# Patient Record
Sex: Male | Born: 1940
Health system: Southern US, Community
[De-identification: ages and names within clinical notes are randomized; demographics above are authoritative.]

## PROBLEM LIST (undated history)

## (undated) DIAGNOSIS — D649 Anemia, unspecified: Secondary | ICD-10-CM

## (undated) DIAGNOSIS — I639 Cerebral infarction, unspecified: Secondary | ICD-10-CM

## (undated) DIAGNOSIS — F419 Anxiety disorder, unspecified: Secondary | ICD-10-CM

## (undated) DIAGNOSIS — K275 Chronic or unspecified peptic ulcer, site unspecified, with perforation: Secondary | ICD-10-CM

## (undated) DIAGNOSIS — N400 Enlarged prostate without lower urinary tract symptoms: Secondary | ICD-10-CM

## (undated) DIAGNOSIS — G709 Myoneural disorder, unspecified: Secondary | ICD-10-CM

## (undated) DIAGNOSIS — M1990 Unspecified osteoarthritis, unspecified site: Secondary | ICD-10-CM

## (undated) DIAGNOSIS — K219 Gastro-esophageal reflux disease without esophagitis: Secondary | ICD-10-CM

## (undated) DIAGNOSIS — E119 Type 2 diabetes mellitus without complications: Secondary | ICD-10-CM

## (undated) DIAGNOSIS — I10 Essential (primary) hypertension: Secondary | ICD-10-CM

## (undated) HISTORY — DX: Cerebral infarction, unspecified: I63.9

## (undated) HISTORY — PX: TONSILLECTOMY: SUR1361

## (undated) HISTORY — PX: ANKLE SURGERY: SHX546

## (undated) HISTORY — DX: Myoneural disorder, unspecified: G70.9

## (undated) HISTORY — PX: CARPAL TUNNEL RELEASE: SHX101

---

## 1980-05-12 DIAGNOSIS — K275 Chronic or unspecified peptic ulcer, site unspecified, with perforation: Secondary | ICD-10-CM

## 1980-05-12 HISTORY — DX: Chronic or unspecified peptic ulcer, site unspecified, with perforation: K27.5

## 1997-11-27 ENCOUNTER — Ambulatory Visit (HOSPITAL_COMMUNITY): Admission: RE | Admit: 1997-11-27 | Discharge: 1997-11-27 | Payer: Self-pay | Admitting: Gastroenterology

## 2004-05-12 HISTORY — PX: TRANSURETHRAL RESECTION OF PROSTATE: SHX73

## 2004-06-22 ENCOUNTER — Emergency Department (HOSPITAL_COMMUNITY): Admission: EM | Admit: 2004-06-22 | Discharge: 2004-06-23 | Payer: Self-pay | Admitting: *Deleted

## 2004-07-09 ENCOUNTER — Encounter (INDEPENDENT_AMBULATORY_CARE_PROVIDER_SITE_OTHER): Payer: Self-pay | Admitting: *Deleted

## 2004-07-09 ENCOUNTER — Inpatient Hospital Stay (HOSPITAL_COMMUNITY): Admission: RE | Admit: 2004-07-09 | Discharge: 2004-07-11 | Payer: Self-pay | Admitting: Urology

## 2004-08-01 ENCOUNTER — Ambulatory Visit (HOSPITAL_COMMUNITY): Admission: RE | Admit: 2004-08-01 | Discharge: 2004-08-01 | Payer: Self-pay | Admitting: Emergency Medicine

## 2004-12-24 ENCOUNTER — Encounter: Admission: RE | Admit: 2004-12-24 | Discharge: 2004-12-24 | Payer: Self-pay | Admitting: Family Medicine

## 2004-12-26 ENCOUNTER — Inpatient Hospital Stay (HOSPITAL_COMMUNITY): Admission: EM | Admit: 2004-12-26 | Discharge: 2005-01-01 | Payer: Self-pay | Admitting: Emergency Medicine

## 2004-12-26 ENCOUNTER — Ambulatory Visit: Payer: Self-pay | Admitting: Infectious Diseases

## 2004-12-26 ENCOUNTER — Encounter: Admission: RE | Admit: 2004-12-26 | Discharge: 2004-12-26 | Payer: Self-pay | Admitting: Family Medicine

## 2004-12-30 ENCOUNTER — Encounter (INDEPENDENT_AMBULATORY_CARE_PROVIDER_SITE_OTHER): Payer: Self-pay | Admitting: Specialist

## 2005-02-14 ENCOUNTER — Encounter: Admission: RE | Admit: 2005-02-14 | Discharge: 2005-02-14 | Payer: Self-pay | Admitting: Neurology

## 2007-02-02 ENCOUNTER — Encounter: Admission: RE | Admit: 2007-02-02 | Discharge: 2007-02-02 | Payer: Self-pay | Admitting: Emergency Medicine

## 2007-02-17 ENCOUNTER — Emergency Department (HOSPITAL_COMMUNITY): Admission: EM | Admit: 2007-02-17 | Discharge: 2007-02-17 | Payer: Self-pay | Admitting: Emergency Medicine

## 2007-06-18 ENCOUNTER — Encounter (INDEPENDENT_AMBULATORY_CARE_PROVIDER_SITE_OTHER): Payer: Self-pay | Admitting: Interventional Radiology

## 2007-06-18 ENCOUNTER — Ambulatory Visit (HOSPITAL_COMMUNITY): Admission: RE | Admit: 2007-06-18 | Discharge: 2007-06-18 | Payer: Self-pay | Admitting: Otolaryngology

## 2007-08-05 ENCOUNTER — Encounter: Admission: RE | Admit: 2007-08-05 | Discharge: 2007-08-05 | Payer: Self-pay | Admitting: Orthopedic Surgery

## 2007-08-31 ENCOUNTER — Ambulatory Visit (HOSPITAL_BASED_OUTPATIENT_CLINIC_OR_DEPARTMENT_OTHER): Admission: RE | Admit: 2007-08-31 | Discharge: 2007-09-01 | Payer: Self-pay | Admitting: Orthopedic Surgery

## 2008-07-26 ENCOUNTER — Ambulatory Visit (HOSPITAL_BASED_OUTPATIENT_CLINIC_OR_DEPARTMENT_OTHER): Admission: RE | Admit: 2008-07-26 | Discharge: 2008-07-26 | Payer: Self-pay | Admitting: Orthopedic Surgery

## 2009-08-13 ENCOUNTER — Encounter: Admission: RE | Admit: 2009-08-13 | Discharge: 2009-08-13 | Payer: Self-pay | Admitting: General Surgery

## 2010-05-12 HISTORY — PX: HERNIA REPAIR: SHX51

## 2010-08-22 LAB — POCT HEMOGLOBIN-HEMACUE: Hemoglobin: 14.6 g/dL (ref 13.0–17.0)

## 2010-08-26 ENCOUNTER — Other Ambulatory Visit: Payer: Self-pay | Admitting: Dermatology

## 2010-09-24 NOTE — Op Note (Signed)
NAME:  David Li, David Li              ACCOUNT NO.:  [id number]   MEDICAL RECORD NO.:  [id number]          PATIENT TYPE:  AMB   LOCATION:  DSC                          FACILITY:  [facility]   PHYSICIAN:  Leonides Grills, M.D.     DATE OF BIRTH:  1940/07/23   DATE OF PROCEDURE:  08/31/2007  DATE OF DISCHARGE:                               OPERATIVE REPORT   PREOPERATIVE DIAGNOSES:  1. Left cavus foot.  2. Left tight gastroc.  3. Left ankle instability.  4. Left anterior distal tibial spurs.  5. Left hindfoot varus malalignment.  6. Left fibular spurs.   POSTOPERATIVE DIAGNOSES:  1. Left cavus foot.  2. Left tight gastroc.  3. Left ankle instability.  4. Left anterior distal tibial spurs.  5. Left hindfoot varus malalignment.   OPERATION:  1. Left Dwyer calcaneal osteotomy.  2. Left first dorsiflexion metatarsal osteotomy.  3. Left Brostrom procedure.  4. Left excision, anterior distal tibial spurs.  5. Left release, deep deltoid ligament.  6. Left gastroc slide.  7. Stress x-rays, left foot.  8. Excision, left fibular spurs.   ANESTHESIA:  General.   SURGEON:  Leonides Grills, MD.   ASSISTANT:  Richardean Canal, PAC.   ESTIMATED BLOOD LOSS:  Minimal.   TOURNIQUET TIME:  Two hours.   COMPLICATIONS:  None.   DISPOSITION:  Stable to PR.   INDICATIONS:  This is a 70 year old male who has a varus talar tilt with  isolated medial left ankle arthritis.  He was initially consented for a  Weber tib-fib osteotomy.  However, on preoperative planning, we decided  that due to the fact that his plafond was parallel with the  weightbearing surface on weightbearing films, we decided not to proceed  with this procedure and add a dorsiflexion first metatarsal osteotomy.  We went over the risks of the procedure, which included infection, nerve  or vessel injury, nonunion, malunion, hard tissue, hardware  failure,  persistent pain, worsening pain, prolonged recovery, persistent  stiffness,  arthritis, persistent pain and worsening pain in the ankle,  and a possibility of an ankle fusion versus hemiarthroplasty in the  future were all explained.  Questions were encouraged and answered.   OPERATION:  The patient was brought to the operating room and placed in  supine position after adequate general endotracheal tube anesthesia with  block was administered as well as Ancef 1 gm IV piggyback.  Pump was  placed on the left ipsilateral hip internally rotating the left lower  extremity.  Left lower extremity was then prepped and draped in sterile  manner over a proximally placed thigh tourniquet.  Limb was gravity  exsanguinated and tourniquet was elevated to 290 mmHg.  Curvilinear  incision was made on the anterior aspect the left ankle.  Dissection was  carried down through the skin.  Hemostasis was obtained.  Capsule was  then opened on the anterior edge of the lateral gutter of the ankle  leaving about 2-3 mm of capsular cuff attached to the lateral malleolus.  Once this was opened, there was a large amount of synovitis in the ankle  joint itself.  This was removed with a rongeur.  We then elevated over  the anterior aspect of the tibia.  We then found the spur on the  anterior aspect of the fibula and this was removed with a curved  Converse osteotome.  We then elevated off the anterior aspect of the  tibia.  Once this was done, this was then protected with a knee  retractor soft tissues anteriorly and with a curved Converse osteotome,  the anterior osteophyte was completely removed.  We then made an  anteromedial approach to the ankle just medial to the anterior tibialis  tendon.  Dissection was carried down through the skin.  Hemostasis was  obtained.  Capsule was opened in line of the incision and through this  incision, we removed the remaining part of the osteophyte off the  anterior aspect of the ankle.  We also through this incision released  the deep deltoid ligament  with a curved Converse osteotome and a blunt  wooden-handled elevator.  There  was also loose body within the deltoid  ligament as well that was removed and we were able to reduce the ankle  joint to normal once this was released.  After this was done, the joint  in the area was copiously irrigated with normal saline.  We then made a  trough in the anterior aspect of the lateral malleolus with a rongeur  and a curved Converse osteotome.  We then placed two mini [employer]  suture anchors that are bioabsorbable with 2-0 FiberWire stitch.  We  then advanced the anterolateral capsule and ligaments with the ankle  held in dorsiflexion and eversion, reconstructed and performed the  Brostrom procedure using a 2-0 FiberWire stitch.  Once this was advanced  into this trough down to the suture anchors, we then repaired the  remaining part of the ligaments to the cuff with 2-0 FiberWire stitch.  This had an Conservation officer, historic buildings.  We were able to range the joint and it  had excellent tension, anterior drawer and eversion.  There was  excellent endpoint as well.  We then made a longitudinal incision just  medial to the EHL tendon.  Dissection was carried down through skin.  Hemostasis was obtained.  Under C-arm guidance, we identified the first  TMT joint and its plane.  We then approximately a centimeter and a half  distal to the first TMT joint performed an osteotomy, closing wedge  type, removing a wedge of bone dorsally.  This measured approximately 2-  3 mm at its base.  Once this was removed, we then closed the osteotomy  and placed a 4-hole mini T plate [employer].  We then placed four 2.0-mm  fully threaded cortical set screws using a 1.1-mm drill hole  respectively.  This had excellent purchase and maintenance of the  correction.  We obtained stress x-rays in AP and lateral planes, which  show no gross motion, fixation, proposition, or excellent alignment as  well.  We then made a longitudinal  incision over the lateral aspect of  the calcaneal tuber.  Dissection was carried down through skin.  Hemostasis was obtained.  We then elevated the soft tissues both  superiorly and inferiorly around the calcaneal tuber as well as off the  lateral wall of the calcaneus for an anticipation of removal of the  wedge.  We then osteotomized the calcaneus protecting the soft tissue  superiorly and inferiorly with Bennett retractors.  Once the osteotomy  was completed, we then  placed an osteotome in the osteotomy site and  stretched the soft tissues.  We then used a lamina spreader to stress  the soft tissue as well.  We then returned back to the saw and removed a  wedge of bone base laterally and this was approximately 3-4 mm at its  base.  Once this was removed, we then closed the osteotomy down  transiently slightly laterally and slightly dorsally to decrease the  overall arch height.  Once this was done, it had excellent apposition.  We then placed a 2-0 K-wire.  Once this was held in correct position, we  then made a longitudinal incision in midline and the posterior aspect of  the heel and then placed two partially-threaded 16-mm cancellous screws  using 4.5 and 3.2-mm drill holes respectively.  This had excellent  purchase and maintenance of the correction.  K-wire was removed.  Stress  x-rays were obtained in AP and lateral planes showed no gross motion,  fixation, proposition, and excellent alignment as well.  Tourniquet was  deflated at this point.  Hemostasis was obtained.  We then made a  longitudinal incision in the medial aspect of the gastrocnemius muscle-  tendinous junction.  Dissection was carried down through the skin.  Hemostasis was obtained.  Conjoined fascia was opened along the  incision.  Conjoined region was then developed between the gastroc  soleus.  Soft tissue was then elevated off the posterior aspect of the  gastrocnemius.  Sural nerves identified and plexus  posteriorly.  Gastrocnemius was then released with a curved Mayo scissors.  This had  extremely tight gastroc.  The area was copiously irrigated with normal  saline.  Subcu was closed with 3-0 Vicryl.  Skin was closed with 4-0  nylon.  Over all wounds, sterile dressing was applied.  Final stress x-  rays were obtained prior to dressing application.  AP and lateral views  of the calcaneus showed no gross motion, fixation, proposition, and  excellent alignment as well.  We did axial view of the calcaneus, showed  the fixation was in proper position as well.  Sterile dressing was  applied.  Modified Jones dressing was applied.  The ankle in neutral  dorsiflexion.  The patient was stable to the PR.      Leonides Grills, M.D.  Electronically Signed     PB/MEDQ  D:  08/31/2007  T:  09/01/2007  Job:  [id number]

## 2010-09-24 NOTE — Op Note (Signed)
NAME:  David Li, David Li NO.:  [id number]   MEDICAL RECORD NO.:  [id number]          PATIENT TYPE:  AMB   LOCATION:  DSC                          FACILITY:  [facility]   PHYSICIAN:  Leonides Grills, M.D.     DATE OF BIRTH:  1940/07/30   DATE OF PROCEDURE:  07/26/2008  DATE OF DISCHARGE:                               OPERATIVE REPORT   PREOPERATIVE DIAGNOSIS:  Right medial and lateral meniscal tears.   POSTOPERATIVE DIAGNOSIS:  Right medial and lateral meniscal tears.   OPERATION:  Right knee arthroscopy with debridement of medial and  lateral meniscal tears.   ANESTHESIA:  General.   SURGEON:  Leonides Grills, MD   ASSISTANT:  Richardean Canal, PA-C   ESTIMATED BLOOD LOSS:  Minimal.   TOURNIQUET:  None.   COMPLICATIONS:  None.   DISPOSITION:  Stable to PR.   INDICATIONS:  This is a 70 year old male who has had mechanical symptoms  in his right knee with pain and swelling that was interfering with his  life.  He was consented for the above procedure.  All risks of  infection, neuro vessel injury, persistent pain, worsening pain,  prolonged recovery, stiffness, arthritis, sinus formation, synovial cyst  formation, and possibility of future knee replacement were all  explained.  Questions were encouraged and answered.   OPERATION:  The patient was brought to the operating room and placed in  supine position after adequate general endotracheal anesthesia was  administered as well as Ancef gram IV piggyback.  We then placed a side  bolster on the right side with carefully padded and all bony prominence  were well padded.  Right lower extremity was then prepped and draped in  sterile manner.  No tourniquet was used.  Anatomical landmarks were  mapped out.  A 20 mL normal saline was instilled into the right knee.  We used a nick and spread technique to create the superomedial inflow  portal into the suprapatellar pouch.  Once inflow was established with  blunt tip trocar  with cannula, then we created our anteromedial portal  with the spinal needle followed by nick and spread technique.  Blunt tip  trocar with cannula followed by camera was placed into the knee and  under direct visualization, the anterolateral portal was then created.  There was a large amount of synovitis in this area in the femoral notch,  anterior knee area.  This was carefully debrided with a shaver and a  radiofrequency 90-degree bevel.  Once this was done, we were able to  visualize the lateral compartment of the knee.  There was bone-on-bone  arthritis in this area and extensive meniscal tear.  This was then  carefully debrided with upbiting forceps as well as shaver.  Once this  was completed there was no flap and this was probed __________ into the  joint.  We then placed the camera anteromedially.  Again, there was just  a small tear on the posterior aspect of the lateral meniscus.  This was  carefully debrided.  There was arthritic changes somewhat to a lesser  extent of the medial  compartment in this area as well.  Patellofemoral  joint had minimal arthritis.  Pictures were obtained throughout the  procedure.  Camera and inflow were removed.  After fluid was removed  from the knee, there was no pulsatile bleeding.  There was no bleeding  in the knee at the end of the procedure.  We closed the wounds with 4-0  nylon stitch.  Sterile dressing was applied.  The patient was stable to  PR.       Leonides Grills, M.D.  Electronically Signed     PB/MEDQ  D:  07/26/2008  T:  07/27/2008  Job:  [id number]

## 2010-09-24 NOTE — Consult Note (Signed)
NAME:  David Li, David Li NO.:  [id number]   MEDICAL RECORD NO.:  [id number]          PATIENT TYPE:  EMS   LOCATION:  ED                           FACILITY:  [facility]   PHYSICIAN:  Genene Churn. Love, M.D.    DATE OF BIRTH:  1940/11/19   DATE OF CONSULTATION:  02/17/2007  DATE OF DISCHARGE:                                 CONSULTATION   PRIMARY CARE PHYSICIAN:  Oley Balm. Georgina Pillion, M.D.   RHEUMATOLOGIST:  Lemmie Evens, M.D.   This 70 year old right-handed white married male from [place], [place] is seen in the [facility] Emergency Room for  evaluation of vertigo and to consider the possibility of stroke.   HISTORY OF PRESENT ILLNESS:  [name] has a past history of unusual  illness in August of 2006 at which time he presented with discomfort in  his legs, elevated liver function tests myalgias and had negative workup  for Lyme disease, [facility] spotted fever, syphilis, and hepatitis.  He had elevated sed rate of 113, was noted to be anemic, and underwent a  temporal artery biopsy which showed nonspecific inflammation, and no  granulomatous changes.  He was started on prednisone, and had marked  improvement in his symptomatology, and has been followed by Dr.  Jimmy Footman, rheumatologist with the diagnosis of suspected vasculitis.  He has been on 2.5mg  of methotrexate once per week on Wednesday, and a  tapering course of methylprednisolone 4 mg daily.  He has had some  generalized weakness, and some symptoms of what he described as  hypoglycemia.   The morning of his visit to emergency room about 6:00 a.m. he noted the  onset of spinning sensation.  He turned over in bed and it got worse.  He had dry heaves, but no vomiting.  He was cold and sweaty, and noted  to be somewhat diaphoretic.  He had vague blurred vision, but no double  vision, and no loss of vision.  He had no associated headache, syncope,  seizure, chest pain, palpitations,  hiccups, double vision, focal  weakness, or numbness.  He was seen emergency room but did not have any  nystagmus.  He felt better and underwent an MRI study of the brain which  showed a questionable area in the medulla without definite evidence of  stroke, and showed no pica on the left.  His MRA, otherwise, was  unremarkable.  He had a few scattered long P2 signal changes which had  been noted on previous MRIs.  He had been on Plavix as an outpatient.  He had no history of cigarette use.  No history of diabetes mellitus, or  known history of coronary artery disease.  He does have a history of  hypertension.   PAST MEDICAL HISTORY:  1. Significant for a vasculitis diagnosed in 2006.  2. Peripheral neuropathy followed by Dr. Thad Ranger in 2004.  3. Carpal tunnel syndrome diagnosed by Dr. Thad Ranger.  4. Hypertension.  5. BPH status post TURP.  6. Squamous cell carcinoma of the back and basal cell carcinoma the      face.  7. He is status post GI surgery for perforated ulcer in 1982.   CURRENT MEDICATION:  1. Methylprednisolone 4 mg daily.  2. Prilosec 20 mg daily.  3. Folic acid 1 mg daily.  4. Methotrexate 15 mg once week.  5. Plavix 75 mg daily.  6. Calcium 600 mg with vitamin D daily.   SOCIAL HISTORY:  He finished college and other courses.  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  He is married and has two children, a daughter 50 and a  son 31 living and well.   FAMILY HISTORY:  His mother died at age 22 with congestive heart  failure.  His father died at 10 with diabetes mellitus, and had a stroke  and myocardial infarction.  He has a brother who died at 73 from lung  cancer and has had cirrhosis he has a sister 31 who has had diabetes  mellitus, and peripheral vascular disease requiring a stent.   PHYSICAL EXAMINATION:  GENERAL:  Revealed a well-developed white male.  VITAL SIGNS:  Blood pressure right-and-left  arm 120/80.  Heart rate was  64, there were no bruits.  MENTAL STATUS:  He is alert and oriented x3, follows 1-2-and-3-step  commands.  His cranial nerve examination revealed visual fields full.  I  did not see any nystagmus.  There was no ocular dysmetria.  Both disks  were seen and flat.  The pupils reacted from 3.5 to 3 bilaterally.  He  had a right ptosis which was apparently congenital and he has known  about it and it is old.  He had no evidence of a Horner syndrome.  His  tongue was midline, the uvula was midline, and  gags were present.  His  sternocleidomastoid and trapezius testing were normal.  His motor  examination revealed good strength in the upper-and-lower extremities.  His coordination testing revealed finger-to-nose and heel-to-shin to be  within normal limits.  His sensory examination was significant decreased  vibration and pinprick in his lower extremities with stocking  distribution, decreased pinprick.  He had absent knee jerks, absent  ankle jerks.  He had a positive Tinel's in his right wrist.  There  was  no cyanosis, clubbing, or edema in extremities.  HEART EXAMINATION:  Revealed no murmurs.  LUNGS:  Clear.  BACK:  He had a scar on his back from his previous squamous cell  carcinoma.   Review of his MRI with Dr. Constance Goltz did not reveal any definite evidence of  stroke.  His vessels were open.  He did not have a well seen left pica,  but this apparently is a fairly common finding.  Other studies have  included a sed rate which was normal.  A cortisol which is 21.7.  White  blood cell count 7400, hemoglobin 13.2, hematocrit 38.2, platelet count  198,000.  ProTime 13.5, INR 1.0, PTT 22.  His myoglobin was 53.0, CK-MB  was 1.3.  Urinalysis was unremarkable.  His sodium 142, potassium 3.5,  chloride 107, CO2 content 27, glucose 125, BUN 11, creatinine 0.72,  calcium 8.6, total protein 5.6, albumin 3.2, SGOT 24, SGPT is 18.   IMPRESSION:  1. Vertigo.  Cannot rule  out the possibility of stroke.  Can also      consider, the possibility of peripheral vestibulopathy.  At this      time, I have discussed with the patient that we do not have full  knowledge of the cause of his event.  He is aware of that.  He is      able to ambulate, and get around, and is currently feeling much      better than on admission.  He will continue Plavix.  He will call      me should he have any further changes.  I will consider admitting      him for further evaluation to include 2-D echo,  if symptoms recur.  2. History of vasculitis.  3. Peripheral neuropathy by examination.  4. Carpal tunnel syndrome.  5. Hypertension.  6. Status post transurethral resection of the prostate for benign      prostatic hypertrophy.  7. Squamous cell carcinoma of the back.  8. Basal cell carcinoma of the face.  9. History of perforated ulcer status post surgery in 1982.           ______________________________  Genene Churn. Sandria Manly, M.D.     JML/MEDQ  D:  02/17/2007  T:  02/18/2007  Job:  [id number]   cc:   Oley Balm. Georgina Pillion, M.D.  Fax: [phone]   Lemmie Evens, M.D.  Fax: [phone]

## 2010-09-27 NOTE — Consult Note (Signed)
David Li, POMPEI NO.:  [id number]   MEDICAL RECORD NO.:  [id number]          PATIENT TYPE:  INP   LOCATION:  6734                         FACILITY:  [facility]   PHYSICIAN:  Bevelyn Buckles. Champey, M.D.DATE OF BIRTH:  May 19, 1940   DATE OF CONSULTATION:  12/28/2004  DATE OF DISCHARGE:                                   CONSULTATION   NEUROLOGY CONSULTATION:   REASON FOR EVALUATION:  Recurrent fevers, myalgias and weakness.   HISTORY OF PRESENT ILLNESS:  David Li is a 70 year old white male who  presented initially with recurrent fevers and myalgias.  His symptoms  started back on December 13, 2004 where he noticed bumps on his head and now  feels one in his right temporal head region.  He does keep a low-grade fever  and started developing muscle aches and pains primarily in his anterior  thighs.  His achiness progressed over time and now patient feels weak in his  bilateral legs stating he can hardly walk.  Pain medications have helped  reduced the pain and allow him to get around and move around.  The patient  also has numbness in his bilateral arms from his wrists to his elbow on both  upper extremities and some lower back pain.  Patient has seen Dr. Thad Ranger  in neurology 4 months ago for some left lower extremity symptoms and EMG  nerve conductions were essentially normal.  Patient also had an MRI of the  neck and back at that time which just showed some arthritic changes.  These  results are as per patient.  The results were not available at this time.   PAST MEDICAL HISTORY:  Positive for BPH status post TURP and ulcers.   CURRENT MEDICATIONS:  Current medications include Cardura, Dilaudid,  oxycodone, Phenergan, and Ambien.   ALLERGIES:  Patient has no known drug allergies.   FAMILY HISTORY:  Positive for peripheral vascular disease, coronary artery  disease and diabetes.   SOCIAL HISTORY:  Patient currently lives with his wife, he quit smoking 22  years  ago, he drinks two glasses of wine per night and denies any drug use.   REVIEW OF SYSTEMS:  Positive for lower extremity aches and weakness,  unsteadiness on his feet and constipation.  Negative for greater than nine  other organ systems.   EXAMINATION:  VITALS:  T-max is 102.2, T-current is 98, pulse is 92,  respirations are 18, blood pressure is 119/68.  Patient has 92% oxygen  saturation on room air.  HEENT:  Normocephalic, atraumatic.  Extraocular muscles are intact.  Pupils  equal, round, reactive to light.  Neck is supple, no carotid bruits.  HEART:  Regular.  LUNGS:  Clear.  ABDOMEN:  Soft, nontender.  EXTREMITIES:  No edema with good pulses.  NEUROLOGICAL EXAMINATION:  Patient is alert and oriented x3, language is  fluent, memory and knowledge are within normal limits.  Cranial nerves II-  XII are grossly intact.  Motor examination:  Patient has 5/5 strength in his  bilateral upper extremities and distal lower extremities and proximal upper  extremities especially in hip  flexion patient is 3-/5 bilaterally.  Patient  has normal tone in all four extremities.  Sensory examination:  Patient has  decreased sensation throughout the entire lower extremities except for  vibration which is unremarkable.  Reflexes are 1 to 2+ throughout and  symmetric, trace at the ankles bilaterally, toes are downgoing bilaterally.  Cerebellar function has within normal limits finger-to-nose.  Gait:  Patient  has a slightly wide-base gait and needs assistance for walking, states he is  in pain.   LABORATORIES:  CBC:  WBC is 15.7, hemoglobin is 12.7, hematocrit 37.9,  platelets 547, ESR is 113.  Sodium is 135, potassium is 4, chloride is 97,  bicarbonate is 28, BUN is 9, creatinine is 1.1, glucose is 101, calcium is  8.2, phosphorus 4.4.  RPR is negative.  Alk phos is 203, AST is 70, ALT is  77.  CK is 13.   IMPRESSION:  David Li is a 70 year old gentleman who presents for  recurrent fevers,  myalgias and lower extremity weakness that is making him  unsteady on his feet.  The etiology for his fever is unclear at this point  and I recommend panculturing for obvious infectious etiologies.  It is also  unclear why his LFTs are elevated.  In certain myopathies LFTs can be  elevated, however, CK is unremarkably normal.  He does have an elevated ESR  as well.  His symptoms are suggestive of a myopathic process or myositis,  possibly polymyositis or polymyalgia rheumatica.  I would like to repeat his  EMG nerve conductions at this time to __________ a myopathic process.  I  would recommend following his ESR and CKs.  I will order PT/OT to help  ambulate the patient and keep him strong.  We will follow the patient.           ______________________________  Bevelyn Buckles. Nash Shearer, M.D.     DRC/MEDQ  D:  12/28/2004  T:  12/28/2004  Job:  [id number]

## 2010-09-27 NOTE — H&P (Signed)
NAME:  DVAUGHN, FICKLE NO.:  [id number]   MEDICAL RECORD NO.:  [id number]          PATIENT TYPE:  INP   LOCATION:  6734                         FACILITY:  [facility]   PHYSICIAN:  Theone Stanley, MD   DATE OF BIRTH:  April 20, 1941   DATE OF ADMISSION:  12/26/2004  DATE OF DISCHARGE:                                HISTORY & PHYSICAL   CHIEF COMPLAINT:  Fevers and myalgia.   HISTORY OF PRESENT ILLNESS:  This started on August 3, when the patient  noticed a little soreness on the back of his head, felt a bump there, and  had a low grade temperature.  It then progressively got worse where his  temperature got higher.  He continued to have muscle pain and joint pain and  subsequently presented to his primary care physician.  At that point after a  history, it was felt that there was a possibility of [facility] Spotted  Fever.  He was placed on Doxycycline, however, he did not improve.  He was  then switched over to amoxicillin.  Again, the patient continued to have  spiking temperatures up to 103 and severe muscle and joint pain.  The  patient states he has to take Darvocet and Tylenol every 4 hours to help  with the pain or fever.  In addition, the patient has become progressively  weaker to the point where he needs to use his upper extremities to help him  get up off of the toilet.  The patient has actually had a workup on an  outpatient basis including [facility] Spotted Fever which was negative,  TSH which was negative, Lyme which was negative, a set of blood cultures  originally were negative, CT scan of abdomen and pelvis which was normal.  Following things are pending:  ANA, blood cultures, and CK, also rubella  titer is pending.  The patient actually was seen by a neurologist because he  had mild joint pain for 3 to 4 years and they had referred him to a  neurologist for workup.  He had carotid Dopplers which were negative.  MRI  of the neck and back which  showed mild arthritis, but nothing significant.  He had EMG which was normal.   PAST MEDICAL HISTORY:  Significant for BPH, questionable hyperlipidemia.   MEDICATIONS:  1.  Cardura 8 mg one p.o. daily.  2.  Avodart 0.5 mg every 3 days.   PAST SURGICAL HISTORY:  CHIRP on July 09, 2004.   ALLERGIES:  No known drug allergies.   FAMILY HISTORY:  Heart disease, stroke, diabetes, and peripheral vascular  disease.   SOCIAL HISTORY:  The patient lives in [place].  He is married and has two  children.  He drinks about one to two glasses of wine daily.  He quit  smoking 22 years ago, however, he does have one pack per day for 20 years.  I did ask about risky behavior.  He denies any type of risk-taking behavior.  He does state that about 25 years ago, he had an episode of gonorrhea which  was treated  and he intermittently gets herpes which he take acyclovir  intermittently.  His herpes presents in his posterior thigh.   REVIEW OF SYSTEMS:  No weight loss, no headache, no lightheadedness or  dizziness, no change in his vision.  No change in the bowel habits, no  change in urinary habit.  No chest pain, shortness of breath, cough,  wheezing.   PHYSICAL EXAMINATION:  VITAL SIGNS:  Temperature 95.6, repeat temperature  was 98.7, blood pressure 126/76, pulse 82, respiratory rate 20, saturating  97% on room air.  HEENT:  Head normocephalic and atraumatic.  Eyes 4 mm, pupils equal, round,  and reactive to light.  Extraocular movements intact.  Ears are normal, no  discharge.  Throat clear, no erythema and no exudate.  Mucosa moist.  NECK:  Supple with no lymphadenopathy, no JVD.  HEART:  Regular rate and rhythm.  No murmurs, rubs, or gallops were heard.  LUNGS:  Clear to auscultation bilaterally.  ABDOMEN:  Soft, tender, and nondistended.  EXTREMITIES:  No edema, cyanosis, or clubbing.  NEUROLOGY:  The patient is alert and oriented x3.  Nonfocal.  GENITOURINARY:  Deferred.  SKIN:  I  was able to palpate a nodule on the patient's right temporal area.  In addition, he did appear to have a small nodule in the posterior aspect of  his head.  They were not tender, they were more sore.  The patient also had  a 1 cm x 4 cm macular rash present on his right flank area.  It is confluent  and did not appear to be flaky.   LABORATORY DATA:  White count 15, hemoglobin 12, hematocrit 37, platelets  549.  Sodium 135, potassium 4.2, chloride 95, CO2 29, glucose 107, BUN 7,  creatinine 0.9, calcium 8.8, total protein 7.9, albumin 2.3, AST 70, ALT  77,alkaline phosphatase 203, total bilirubin 0.6.   A chest x-ray was performed on August 15, which showed no active  cardiopulmonary disease.  A CT of the abdomen and pelvis was also performed  which showed slight diffuse fatty infiltrate of the liver, minimal  sclerosis, otherwise negative.  Pelvic showed post CHIRP with probable  slight diffuse bladder wall muscular hypertrophy due to chronic partial  bladder outlet obstruction, otherwise negative.   Labs pending:  Blood culture, CK, ANA, rubella titer.   ASSESSMENT:  Mr. Mooneyhan is a very pleasant 70 year old Caucasian gentleman  presenting with progressive fever and myalgias, and joint pain.   Problem 1.  Fever, myalgias, joint pain.  It is unclear as to what is  exactly causing this patient his problems.  It is suspicious for infectious,  but also need to consider other rheumatological including vasculitis,  polymyositis, however, the patient's clinical picture does not completely  point to any of the following diagnoses.  In the meantime, I have contacted  infectious disease to have them come by and consult.  We will obtain a CK to  see if this is elevated.  I will also check ANA, follow-up on rubella titer.  Repeat blood cultures.   Problem 2.  Elevated LFT's.  Again the exact etiology is unclear.  He has been taking a significant amount of Tylenol.  I will check an  acetaminophen  level.  In addition, will stop all Tylenol and try to control his pain with  Dilaudid or Oxycodone.      Theone Stanley, MD  Electronically Signed     AEJ/MEDQ  D:  12/26/2004  T:  12/27/2004  Job:  [id number]   cc:   Jethro Bastos, M.D.  [address]  [address]  [place]  [place] [address]  Fax: [phone]   Oley Balm. Georgina Pillion, M.D.  [address]  [place]  [place] [address]  Fax: [phone]   Rockey Situ. Flavia Shipper., M.D.  [address]  [place]  [place] [address]  Fax: [phone]

## 2010-09-27 NOTE — Discharge Summary (Signed)
[name], David Li NO.:  [id number]   MEDICAL RECORD NO.:  [id number]          PATIENT TYPE:  INP   LOCATION:  6734                         FACILITY:  [facility]   PHYSICIAN:  Theone Stanley, MD   DATE OF BIRTH:  11-Feb-1941   DATE OF ADMISSION:  12/26/2004  DATE OF DISCHARGE:  01/01/2005                                 DISCHARGE SUMMARY   ADDENDUM:   DISCHARGE MEDICATIONS:  1.  Prednisone 20 mg one p.o. daily. Adjustments to be made by Dr. Georgina Pillion.  2.  Avodart 0.5 mg every three days.  3.  Cardura 8 mg one p.o. at night.   DISCHARGE INSTRUCTIONS:  A CBC and ESR when the patient follows with Dr.  Georgina Pillion.      Theone Stanley, MD  Electronically Signed     AEJ/MEDQ  D:  01/01/2005  T:  01/02/2005  Job:  [id number]   cc:   Oley Balm. Georgina Pillion, M.D.  [address]  [place]  [place] [address]  Fax: [phone]   Jethro Bastos, M.D.  [address]  [address]  [place]  [place] [address]  Fax: [phone]

## 2010-09-27 NOTE — Op Note (Signed)
NAME:  David, Li NO.:  [id number]   MEDICAL RECORD NO.:  [id number]          PATIENT TYPE:  OBV   LOCATION:  0098                         [facility]ACILITY:  [facility]   PHYSICIAN:  Boston Service, M.D.DATE OF BIRTH:  [birth year]   DATE OF PROCEDURE:  07/09/2004  DATE OF DISCHARGE:                                 OPERATIVE REPORT   PREOPERATIVE DIAGNOSES:  A 70 year old male with urinary retention despite  Cardura and Avodart, postvoid residuals have ranged between 600 mL.  Discussed with the patient, the risks, benefits, and alternatives to  cystoscopy and TURP. Reference made to office notes from June 25, 2004,  June 18, 2004 and July 04, 2002.Marland Kitchen   POSTOPERATIVE DIAGNOSES:  Same.   PROCEDURE:  Cystoscopy, TURP, TUIP.   ANESTHESIA:  General.   DRAINS:  24-French Foley.   SPECIMENS:  TUR chips.   DESCRIPTION OF PROCEDURE:  The patient was prepped and draped in the  dorsolithotomy position after institution of an adequate level of general  anesthesia. A well lubricated 21-French panendoscope was gently inserted at  the urethral meatus, normal urethra and sphincter, short tight prostatic  urethra, trabeculated bladder, evidence of prior indwelling Foley catheter.  No other obvious intravesical pathology. Cystoscope was removed, replaced  with the continuous flow resectoscope sheath with the loop. Resection was  begun with a single furrow at the 6 o'clock position to allow free efflux of  chips. Resection was then begun at the 10 o'clock position on the right lobe  and carried down to the 6 o'clock position, begun again at the 2 o'clock  position on the left lobe and carried down 6 o'clock position. A small  amount of tissue was resected anteriorly and no capsular perforations were  noted. Due to the small size of the gland, the incision was made at the  bladder neck in hopes of preventing bladder neck contracture at the 4 and 8  o'clock position using  the [employer]. Chips were then irrigated from the  bladder, VaporTrode element was inserted to achieve adequate hemostasis  within the prostatic urethra. There was a small capsular bleeding site at  about the 9 o'clock position on the right side cauterized with the  VaporTrode element.  Resectoscope sheath was then withdrawn, 24-French three-way Foley was  inserted, 40 mL  in a 30 mL balloon.  It was left to light traction and the  patient was given a B&O suppository and returned to recovery in satisfactory  condition.      RH/MEDQ  D:  07/09/2004  T:  07/09/2004  Job:  [id number]   cc:   Oley Balm. Georgina Pillion, M.D.  [address]  [place]  [place] [address]  Fax: [phone]

## 2010-09-27 NOTE — Op Note (Signed)
[name], David Li NO.:  [id number]   MEDICAL RECORD NO.:  [id number]          PATIENT TYPE:  INP   LOCATION:  6734                         FACILITY:  [facility]   PHYSICIAN:  Angelia Mould. Derrell Lolling, M.D.DATE OF BIRTH:  27-Apr-1941   DATE OF PROCEDURE:  12/30/2004  DATE OF DISCHARGE:                                 OPERATIVE REPORT   PREOPERATIVE DIAGNOSIS:  Giant cell arteritis.   POSTOPERATIVE DIAGNOSIS:  Giant cell arteritis.   OPERATION PERFORMED:  Right temporal artery biopsy.   SURGEON:  Angelia Mould. Derrell Lolling, M.D.   OPERATIVE INDICATIONS:  This is a 70 year old white male who was admitted to  [facility] on December 26, 2004.  He was having fever of unknown  origin, myalgias and weakness.  He has had multiple consults obtained.  All  of his consultants including Neurology feel that he should have a temporal  artery biopsy to rule out giant cell arteritis.  I was asked to see him.  The patient stated that he had had some painless thickening in the right pre-  temporal area 2 or 3 days ago which has now resolved.  He denies visual  symptoms or headaches.  He has bounding temporal artery pulses.  He is  brought to operating room semi-electively for temporal artery biopsy.   OPERATIVE TECHNIQUE:  The patient was placed supine on the operating table  with his head turned the left in a slightly reversed Trendelenburg position.  We shaved some of the hair in the right preauricular area.  He was monitored  and sedated by the anesthesia department.  The right ear and right face were  prepped and draped in a sterile fashion.  We used a Doppler to identify the  main right temporal artery in the anterior branch.  We marked this out with  a marking pen.  1% Xylocaine with epinephrine was used a local infiltration  anesthetic.  A curved incision was made in front of the right ear.  Dissection was carried down through the subcutaneous tissue to the deeper  fascia, which  was incised, exposing a tortuous right temporal artery that  had a good pulse it.  This was dissected out and a couple of side branches  were clamped with hemostats.  Then the proximal and distal temporal artery  were clamped with hemostats and the entire vessel was excised, giving the  pathologist about 2.5-cm length of vessel.  The cut ends of the remaining  vessel were tied off with 3-0 Vicryl ties.  Hemostasis was excellent and  achieved with electrocautery.  The specimen was sent to Pathology.  The skin  was closed with a running subcuticular suture of 5-0 Monocryl and Steri-  Strips.  Clean bandages were placed and the patient taken to the recovery  room in stable condition.  Estimated blood loss was about 5 mL.   COMPLICATIONS:  None.   COUNTS:  Sponge, needle and instrument counts were correct.      Angelia Mould. Derrell Lolling, M.D.  Electronically Signed     HMI/MEDQ  D:  12/30/2004  T:  12/31/2004  Job:  [id number]   cc:   Theone Stanley, MD   Althea Grimmer. Luther Parody, M.D.  [address]  [place]  [place] [address]  Fax: [phone]   Lacretia Leigh. Ninetta Lights, M.D.  [address]  [place]  [place] [address]  Fax: [phone]

## 2010-09-27 NOTE — Discharge Summary (Signed)
NAMELAWRENCE, MITCH NO.:  [id number]   MEDICAL RECORD NO.:  [id number]          PATIENT TYPE:  INP   LOCATION:  6734                         FACILITY:  [facility]   PHYSICIAN:  Theone Stanley, MD   DATE OF BIRTH:  [birth year]   DATE OF ADMISSION:  12/26/2004  DATE OF DISCHARGE:  01/01/2005                                 DISCHARGE SUMMARY   ADMISSION DIAGNOSES:  1.  Fever.  2.  Myalgias.  3.  Arthralgias.  4.  Benign prostatic hypertrophy.   DISCHARGE DIAGNOSES:  1.  Fever, myalgias, arthralgias secondary to vasculitis of unknown type      currently.  2.  Benign prostatic hypertrophy.   CONSULTATIONS:  1.  Dr. Ninetta Lights with infectious disease.  2.  Dr. Derrell Lolling with [facility].  3.  Dr. Nash Shearer with neurology.  4.  Dr. Luther Parody with gastroenterology.   HOSPITAL COURSE:  Mr. Crochet is a very pleasant 70 year old gentleman who  started to have problems on August 3, originally with a small bump on the  back of his head and low-grade fevers.  This progressed to almost daily  fevers, myalgias, arthralgias, and weakness in the lower extremity.  Patient  was admitted on August 17th.  The patient had an extensive workup on an  outpatient basis with no identifiable causes, including [facility]  spotted fever, which was negative, Lyme's disease, which was negative.  Patient had further testing in the hospital.  The results are as follows:  RPR, which was nonreactive.  Last CSR was 97.  Hepatitis panel was negative.  EBV IgM was negative.  ANA which was negative.  Because of all of these  negative causes for infectious and other things with continued problems of  weakness, a neurology consult was performed.  IgM was performed.  The  results are still pending.  His LFTs continued to rise.  At that point in  time, gastroenterology, Dr. Luther Parody was consulted.  He felt it was most  likely a vasculitis, giant cell, or polymyalgia rheumatica.  The patient  subsequently had a temporal artery biopsy and then started on prednisone.  Once he started on his prednisone, his fever disappeared.  His myalgias and  arthralgias improved.  He was able to walk around without any difficulty.  I  was able to contact the pathologist to get a preliminary verbal report.  He  could not specifically state it was giant cell arthritis.  He did have  inflammation.  He said it is in the differential; however, nonspecific  vasculitis is also in the differential.  I contacted Dr. Georgina Pillion, his primary  care physician, and informed him of all of these results in regards to the  fact that it is a vasculitis of unknown type at this point in time with  improvement on prednisone.  He asked me to contact the rheumatologist.  I  made an attempt to contact Dr. Phylliss Bob, phone number [phone].  The office was  closed.  I will let Dr. Georgina Pillion refer the patient to a rheumatologist.  The  patient left in improved and stable condition.  DIET:  Regular.   FOLLOW UP:  Patient is to follow up with Dr. Georgina Pillion in 1-2 weeks, Dr. Derrell Lolling  on a p.r.n. basis, Dr. Ninetta Lights on a p.r.n. basis, Dr. Luther Parody on a p.r.n.  basis if needed.      Theone Stanley, MD  Electronically Signed     AEJ/MEDQ  D:  01/01/2005  T:  01/01/2005  Job:  [id number]   cc:   Oley Balm. Georgina Pillion, M.D.  [address]  [place]  [place] [address]  Fax: [phone]   Jethro Bastos, M.D.  [address]  [address]  [place]  [place] [address]  Fax: [phone]   Lacretia Leigh. Ninetta Lights, M.D.  [address]  [place]  [place] [address]  Fax: [phone]   Angelia Mould. Derrell Lolling, M.D.  [address]  [place]  [place] [address]

## 2010-09-27 NOTE — Consult Note (Signed)
NAMEJAKIN, PAVAO NO.:  [id number]   MEDICAL RECORD NO.:  [id number]          PATIENT TYPE:  INP   LOCATION:  6734                         FACILITY:  [facility]   PHYSICIAN:  Althea Grimmer. Santogade, M.D.DATE OF BIRTH:  [birth year]   DATE OF CONSULTATION:  12/29/2004  DATE OF DISCHARGE:                                   CONSULTATION   GASTROENTEROLOGY CONSULTATION:   HISTORY OF PRESENT ILLNESS:  [name] is a 70 year old male whom I  am asked to see for abnormal liver function tests and fever.  He began to  get ill on August 4 and progressively worsened complaining of fevers and  myalgias.  The myalgias began in his medial thighs and have spread upward  and downward in his legs.  He has extreme aching in his legs and weakness.  He does not particularly describe weakness in the shoulder areas.  At a  similar time he noticed some tender bumps on his head initially in the  occipital area and now in the right temporal area.  He reportedly was  evaluated for [facility] spotted fever and Lyme disease and these tests  were negative but he was initially treated with doxycycline and then  amoxicillin without relief.  Blood cultures as an outpatient were reportedly  negative.  Symptoms worsened and his temperatures spiked over 100 and he was  admitted to the hospital.  Here his symptoms have changed little.  He  continues to spike fevers over 101.  Sedimentation rate is 113.  Blood  cultures have been negative x3.  He is anemic with a hemoglobin of 10.4,  white blood count is 16.2.  Electrolytes are normal.  Hepatitis A, B, and C  acute serologies are negative.  Epstein-Barr virus serologies are pending.  CMV have been ordered.  His liver function tests have mildly worsened and  currently his alkaline phosphatase is 238, SGOT 120, SGPT 124, albumin has  decreased to 1.6, his RPR is negative, total bilirubin 0.5.  He has not been  on any new medications other than  the recent antibiotics.  He has been on  Avodart for prostatism since February but it has been discontinued on this  admission.  His only long-standing medication is Cardura.  He has not had  any recent travel or exposures or ill associates.  He reports having had  colonoscopies x2 by Dr. Matthias Hughs.  He thinks small polyps were initially  removed but his subsequent colonoscopy was normal.  CT scan in the hospital  shows a fatty liver but is otherwise negative in the abdomen and pelvis.  He  denies sore throat, dysphagia, nausea, vomiting, abdominal pain, diarrhea,  melena or hematochezia.  He tends to be mildly constipated.   PAST MEDICAL HISTORY:  Past medical history is pertinent for benign  prostatic hypertrophy.  He is status post TURP in February 2006.  He also  has a remote history of perforated ulcer which he says was oversewn many  years ago by Dr. Maryagnes Amos.  He also has had genital herpes recurrences mainly  on his thigh treated with  antivirals and not currently active.   CURRENT MEDICATIONS:  Cardura.   ALLERGIES:  None reported.   FAMILY HISTORY:  Negative for significant colorectal or hepatic diseases.   SOCIAL HISTORY:  Married, remote smoker, drinks two glasses of wine at  night; no recreational drug use.   REVIEW OF SYSTEMS:  GENERAL:  Minimal weight loss.  ENDOCRINE:  No known  thyroid problems or diabetes.  SKIN:  No current significant rash.  EYES:  No icterus or change in vision.  ENT:  No aphthous ulcers or chronic sore  throat.  RESPIRATORY:  No shortness of breath, cough, or wheezing.  CARDIAC:  No chest pain, palpitations, or history of valvular heart disease.  GI:  As  above.  GU:  As above.  No current dysuria or hematuria.  Remainder of  review of systems is negative.   PHYSICAL EXAMINATION:  VITAL SIGNS:  He has a temperature of 101.2, blood  pressure is 116/62, pulse 89.  SKIN:  Normal.  HEAD AND NECK:  Eyes anicteric, conjunctivae pink without  hemorrhages.  Oropharynx unremarkable without aphthous ulcers.  Neck is supple without  thyromegaly or adenopathy.  CHEST:  Chest sounds clear.  Heart sounds regular rate and rhythm without  murmurs.  ABDOMEN:  Abdomen benign without mass, tenderness, organomegaly.  Liver is  not palpable.  The right upper quadrant is nontender.  RECTAL:  Rectal exam not performed.  EXTREMITIES:  Extremities without cyanosis, clubbing, edema, or rash.   IMPRESSION:  Mild liver function abnormalities which I suspect are secondary  to a generalized process such as an autoimmune disease.  Within the  differential diagnosis is cytomegalovirus infection or a subacute  cholangitis but I do not understand why he would have the latter and CT scan  does not suggest any biliary distention or infiltrative disease in the  liver.  My strongest suspicion is that he has giant cell or temporal  arteritis with polymyalgia rheumatica and secondary abnormal liver function  tests.   RECOMMENDATIONS:  Follow the LFTs.  If they progressively worsen repeat  imaging of the biliary system such as an MRCP, however, I would strongly  recommend that he have a temporal artery biopsy.  CMV serologies will be  checked.  Repeat a chest x-ray at some point if fevers continue to spike.      Althea Grimmer. Luther Parody, M.D.  Electronically Signed     PJS/MEDQ  D:  12/29/2004  T:  12/29/2004  Job:  [id number]   cc:   Theone Stanley, MD   Bernette Redbird, M.D.  [address] [place]  [place], [place] [address]  Fax: [phone]   Oley Balm. Georgina Pillion, M.D.  [address]  [place]  [place] [address]  Fax: [phone]

## 2010-09-27 NOTE — Discharge Summary (Signed)
NAME:  David Li, David Li NO.:  [id number]   MEDICAL RECORD NO.:  [id number]          PATIENT TYPE:  OBV   LOCATION:  0356                         [facility]ACILITY[facility]   PHYSICIAN:  Boston Service, M.D.DATE OF BIRTH:  12-Jun-1940   DATE OF ADMISSION:  07/09/2004  DATE OF DISCHARGE:  07/11/2004                                 DISCHARGE SUMMARY   Indications, medications, allergies, tobacco, ETOH, past medical history,  social history, physical exam, and review of systems all outlined in the  handwritten admit note.   HOSPITAL COURSE:  The patient was admitted July 09, 2004 and underwent  TURP. The patient had a pleasantly uneventful postoperative recovery.  Bladder irrigation was discontinued on postoperative day #1. The catheter  was discontinued on postoperative day #2. The patient voiding pink urine  with mild irritative symptoms. The preliminary pathology report BPH. The  patient was discharged home with Macrobid and Vicodin with plans for follow-  up with Dr. Wanda Plump in the office in about 2 weeks.      RH/MEDQ  D:  07/17/2004  T:  07/17/2004  Job:  [id number]

## 2011-01-31 LAB — CBC
HCT: 41.6
Hemoglobin: 14.1
MCHC: 33.9
MCV: 96.2
Platelets: 254
RBC: 4.32
RDW: 13.8
WBC: 9.1

## 2011-01-31 LAB — BASIC METABOLIC PANEL
BUN: 11
CO2: 27
Calcium: 9.1
Chloride: 103
Creatinine, Ser: 0.71
GFR calc Af Amer: >60
GFR calc non Af Amer: >60
Glucose, Bld: 104 — ABNORMAL HIGH
Potassium: 4.1
Sodium: 136

## 2011-01-31 LAB — PROTIME-INR
INR: 0.9
Prothrombin Time: 12.7

## 2011-01-31 LAB — APTT: aPTT: 26

## 2011-02-04 LAB — BASIC METABOLIC PANEL
BUN: 8
CO2: 25
Calcium: 9.2
Chloride: 105
Creatinine, Ser: 0.77
GFR calc Af Amer: >60
GFR calc non Af Amer: >60
Glucose, Bld: 103 — ABNORMAL HIGH
Potassium: 4.3
Sodium: 138

## 2011-02-20 LAB — PROTIME-INR
INR: 1
Prothrombin Time: 13.5

## 2011-02-20 LAB — APTT: aPTT: 22 — ABNORMAL LOW

## 2011-02-20 LAB — CBC
HCT: 38.2 — ABNORMAL LOW
Hemoglobin: 13.2
RDW: 13.3

## 2011-02-20 LAB — DIFFERENTIAL
Basophils Absolute: 0
Basophils Relative: 0
Neutro Abs: 5.4
Neutrophils Relative %: 73

## 2011-02-20 LAB — URINALYSIS, ROUTINE W REFLEX MICROSCOPIC
Glucose, UA: NEGATIVE
Ketones, ur: NEGATIVE
pH: 6.5

## 2011-02-20 LAB — COMPREHENSIVE METABOLIC PANEL
Alkaline Phosphatase: 39
BUN: 11
CO2: 27
Chloride: 107
GFR calc non Af Amer: >60
Glucose, Bld: 125 — ABNORMAL HIGH
Potassium: 3.5
Total Bilirubin: 1.1
Total Protein: 5.6 — ABNORMAL LOW

## 2011-02-20 LAB — SEDIMENTATION RATE: Sed Rate: 8

## 2011-05-13 HISTORY — PX: KNEE ARTHROSCOPY W/ DEBRIDEMENT: SHX1867

## 2012-02-07 ENCOUNTER — Emergency Department (HOSPITAL_COMMUNITY): Payer: Medicare Other

## 2012-02-07 ENCOUNTER — Encounter (HOSPITAL_COMMUNITY): Payer: Self-pay | Admitting: Emergency Medicine

## 2012-02-07 ENCOUNTER — Emergency Department (HOSPITAL_COMMUNITY)
Admission: EM | Admit: 2012-02-07 | Discharge: 2012-02-07 | Disposition: A | Discharge status: Home / Self Care | Payer: Medicare Other | Attending: Emergency Medicine | Admitting: Emergency Medicine

## 2012-02-07 DIAGNOSIS — N400 Enlarged prostate without lower urinary tract symptoms: Secondary | ICD-10-CM | POA: Insufficient documentation

## 2012-02-07 DIAGNOSIS — Z87891 Personal history of nicotine dependence: Secondary | ICD-10-CM | POA: Insufficient documentation

## 2012-02-07 DIAGNOSIS — Z79899 Other long term (current) drug therapy: Secondary | ICD-10-CM | POA: Insufficient documentation

## 2012-02-07 DIAGNOSIS — IMO0002 Reserved for concepts with insufficient information to code with codable children: Secondary | ICD-10-CM | POA: Insufficient documentation

## 2012-02-07 DIAGNOSIS — S61209A Unspecified open wound of unspecified finger without damage to nail, initial encounter: Secondary | ICD-10-CM | POA: Insufficient documentation

## 2012-02-07 DIAGNOSIS — W208XXA Other cause of strike by thrown, projected or falling object, initial encounter: Secondary | ICD-10-CM | POA: Insufficient documentation

## 2012-02-07 HISTORY — DX: Benign prostatic hyperplasia without lower urinary tract symptoms: N40.0

## 2012-02-07 HISTORY — DX: Chronic or unspecified peptic ulcer, site unspecified, with perforation: K27.5

## 2012-02-07 MED ORDER — OXYCODONE-ACETAMINOPHEN 5-325 MG PO TABS: 1.0000 | ORAL_TABLET | Freq: Four times a day (QID) | ORAL | Status: DC | PRN

## 2012-02-07 MED ORDER — OXYCODONE-ACETAMINOPHEN 5-325 MG PO TABS
2.0000 | ORAL_TABLET | Freq: Once | ORAL | Status: AC
  Administered 2012-02-07: 2 via ORAL
  Filled 2012-02-07: qty 2

## 2012-02-07 MED ORDER — AMOXICILLIN-POT CLAVULANATE 875-125 MG PO TABS: 1.0000 | ORAL_TABLET | Freq: Two times a day (BID) | ORAL | Status: DC

## 2012-02-07 NOTE — ED Provider Notes (Signed)
History     CSN: [id number]  Arrival date & time 02/07/12  1500   First MD Initiated Contact with Patient 02/07/12 1541      Chief Complaint  Patient presents with  . Finger Injury    (Consider location/radiation/quality/duration/timing/severity/associated sxs/prior treatment) HPI Comments: Patient reports that just prior to arrival he had an 80 lb concrete statue fall on his left 5th digit.  He has been having some pain and swelling of the middle phalanx since that time.  He also has a small 0.5 cm superficial laceration on the lateral portion of the middle phalanx of the left 5th digit.  Laceration has been bleeding slightly.  He denies any numbness or tingling.  Patient reports that his last tetanus shot was 2 years ago.  He has not taken anything for pain prior to arrival in the ED.    The history is provided by the patient.    Past Medical History  Diagnosis Date  . Perforated ulcer   . BPH (benign prostatic hyperplasia)     Past Surgical History  Procedure Date  . Transurethral resection of prostate   . Tonsillectomy   . Hernia repair     No family history on file.  History  Substance Use Topics  . Smoking status: Former Games developer  . Smokeless tobacco: Not on file  . Alcohol Use: 4.2 oz/week    7 Glasses of wine per week      Review of Systems  Musculoskeletal: Positive for joint swelling.  Skin: Positive for color change and wound.  All other systems reviewed and are negative.    Allergies  Review of patient's allergies indicates no known allergies.  Home Medications   Current Outpatient Rx  Name Route Sig Dispense Refill  . ALPRAZOLAM 0.5 MG PO TABS Oral Take 0.5 mg by mouth 3 (three) times daily as needed.    Marland Kitchen VITAMIN D 1000 UNITS PO TABS Oral Take 1,000 Units by mouth daily.    Marland Kitchen CLOPIDOGREL BISULFATE 75 MG PO TABS Oral Take 75 mg by mouth daily.    Marland Kitchen GABAPENTIN 600 MG PO TABS Oral Take 600 mg by mouth 3 (three) times daily.    . ADULT  MULTIVITAMIN W/MINERALS CH Oral Take 1 tablet by mouth daily.    Marland Kitchen NORTRIPTYLINE HCL 25 MG PO CAPS Oral Take 25 mg by mouth 2 (two) times daily.    . OMEGA-3 EPA FISH OIL PO Oral Take by mouth.    . OMEPRAZOLE 20 MG PO CPDR Oral Take 20 mg by mouth daily.    Marland Kitchen ZOLPIDEM TARTRATE 10 MG PO TABS Oral Take 2.5 mg by mouth at bedtime as needed.      BP 138/97  Pulse 80  Temp 98.2 F (36.8 C) (Oral)  Resp 18  Ht 5\' 10"  (1.778 m)  Wt 225 lb (102.059 kg)  BMI 32.28 kg/m2  SpO2 94%  Physical Exam  Nursing note and vitals reviewed. Constitutional: He appears well-developed and well-nourished.  HENT:  Head: Normocephalic and atraumatic.  Cardiovascular: Normal rate, regular rhythm and normal heart sounds.   Pulses:      Radial pulses are 2+ on the right side, and 2+ on the left side.  Pulmonary/Chest: Effort normal and breath sounds normal.  Musculoskeletal:       Patient unable to fully extend the left 5th digit. Swelling of the middle phalanx of the left 5th digit Full ROM of the left wrist.  Neurological: He is alert. No sensory  deficit.  Skin: He is not diaphoretic.       Patient with good capillary refill of the left 5th digit Left 5th digit with small superficial 0.5 cm laceration of the lateral middle phalanx.  Laceration not actively bleeding.   Bruising and swelling of the left 5th digit middle phalanx.    Psychiatric: He has a normal mood and affect.    ED Course  Procedures (including critical care time)  Labs Reviewed - No data to display Dg Finger Little Left  02/07/2012  *RADIOLOGY REPORT*  Clinical Data: Finger injury  LEFT LITTLE FINGER 2+V  Comparison: None.  Findings: Comminuted fracture of the middle phalanx, mildly displaced on the lateral view.  Associated soft tissue swelling/irregularity.  No additional fracture is seen.  IMPRESSION: Comminuted fracture of the middle phalanx, as described above.   Original Report Authenticated By: Charline Bills, M.D.       No diagnosis found.  Discussed with Dr. Radford Pax.  4:32 PM Discussed with Dr. Mina Marble with Hand Surgery.  He reports that he will take a look at the patient's film and call back.  Dr. Mina Marble has looked at the xray.  He is recommending placing a xeroform on the area along with a sterile dressing.  He also recommends putting the finger in a splint and having the patient follow up with him in the office on Monday.  He also recommends a 5 day course of antibiotics.  MDM  Patient with comminuted fracture of his left 5th digit.  Patient neurovascularly intact.  Dr. Mina Marble with Hand Surgery has been consulted.  Finger splinted and patient given 5 day course of antibiotics.  Patient instructed to call Hand Surgery and schedule an appointment on Monday.        Pascal Lux [place], PA-C 02/08/12 [phone]

## 2012-02-07 NOTE — ED Notes (Signed)
Pt c/o left finger 5th digit injury w/ lac. Digit is swollen and shows slight deformity. Pt reports that finger was "smashed" under approx 80lb concrete statuary. Finger appears cyanotic at tip, but with fingernail refill WNL.

## 2012-02-10 NOTE — ED Provider Notes (Signed)
Medical screening examination/treatment/procedure(s) were performed by non-physician practitioner and as supervising physician I was immediately available for consultation/collaboration.    Nelia Shi, MD 02/10/12 1949

## 2012-10-03 ENCOUNTER — Other Ambulatory Visit: Payer: Self-pay | Admitting: Neurology

## 2012-10-20 ENCOUNTER — Other Ambulatory Visit: Payer: Self-pay | Admitting: Dermatology

## 2012-10-30 ENCOUNTER — Other Ambulatory Visit: Payer: Self-pay | Admitting: Neurology

## 2012-11-06 ENCOUNTER — Other Ambulatory Visit: Payer: Self-pay | Admitting: Neurology

## 2012-12-10 ENCOUNTER — Other Ambulatory Visit: Payer: Self-pay | Admitting: Neurology

## 2012-12-17 ENCOUNTER — Telehealth: Payer: Self-pay | Admitting: Neurology

## 2012-12-17 MED ORDER — CLOPIDOGREL BISULFATE 75 MG PO TABS: 75.0000 mg | ORAL_TABLET | Freq: Every day | ORAL | Status: DC

## 2012-12-17 NOTE — Telephone Encounter (Signed)
Rx sent 

## 2012-12-20 ENCOUNTER — Other Ambulatory Visit: Payer: Self-pay | Admitting: Dermatology

## 2013-02-07 ENCOUNTER — Other Ambulatory Visit: Payer: Self-pay | Admitting: Dermatology

## 2013-03-01 ENCOUNTER — Ambulatory Visit (INDEPENDENT_AMBULATORY_CARE_PROVIDER_SITE_OTHER): Payer: Medicare Other | Admitting: Neurology

## 2013-03-01 ENCOUNTER — Encounter: Payer: Self-pay | Admitting: Neurology

## 2013-03-01 VITALS — BP 115/73 | HR 75 | Ht 70.0 in | Wt 226.0 lb

## 2013-03-01 DIAGNOSIS — I776 Arteritis, unspecified: Secondary | ICD-10-CM

## 2013-03-01 DIAGNOSIS — G609 Hereditary and idiopathic neuropathy, unspecified: Secondary | ICD-10-CM

## 2013-03-01 MED ORDER — NORTRIPTYLINE HCL 25 MG PO CAPS: 50.0000 mg | ORAL_CAPSULE | Freq: Every day | ORAL | Status: DC

## 2013-03-01 MED ORDER — GABAPENTIN 600 MG PO TABS: 600.0000 mg | ORAL_TABLET | Freq: Three times a day (TID) | ORAL | Status: DC

## 2013-03-01 NOTE — Progress Notes (Signed)
History of Present Illness:   [name], is a 72 yo white male return for follow up of peripheral neuropathy.  He has been followed by Dr. Thad Ranger since April 2006, when he presented with sensory changes in the lower extremities. NCV and spine imaging were negative.   In August 2006 he was hospitalized with fever and myalgias, and temporal artery biopsy demonstrated vasculitis. He has since been treated with methotrexate and prednisone, with good response of vasculitis.  He is no longer on immunosuppressive treatment.   His  neuropathy has progressed by exam and followup NCV. Symptoms have been fairly well controlled on gabapentin 600 mg 3 times a day and nortriptyline 25 mg every night. He had a few bouts of double vision a few years ago, thought likely due to vasculitis. He had a spell of dizziness in 2008, negative imaging at that time, seen in the ER by Dr. Sandria Manly and treated with Plavix for possible TIA.  Last clinical visit was in May third 2013, he continues to complain bilateral feet paresthesia, mild unsteady gait, lost balance easily, his symptoms are under fair control with gabapentin, and nortriptyline  Review of Systems  Out of a complete 14 system review, the patient complains of only the following symptoms, and all other reviewed systems are negative.  Bilateral lower extremity swelling, dizziness, tremor,   Social History Retired Married for 26 years and has 2 children Lives at home with his wife Drinks caffeine, 1/2 glass red wine per day Does not use drugs or tobacco drinks 1 glass red wine Inhaled Tobacco Use: former smoker  Family History Father died at the age of 29 from a stroke Mother died at the age of 4 from a stroke Mother, father and sister have a history of diabetes  Past Medical History Chronic pain in feet and ankles  PHYSICAL EXAMINATOINS:  Generalized: In no acute distress  Neck: Supple, no carotid bruits   Cardiac: Regular rate  rhythm  Pulmonary: Clear to auscultation bilaterally  Musculoskeletal: No deformity  Neurological examination  Mentation: Alert oriented to time, place, history taking, and causual conversation  Cranial nerve II-XII: Pupils were equal round reactive to light extraocular movements were full, visual field were full on confrontational test. facial sensation and strength were normal. hearing was intact to finger rubbing bilaterally. Uvula tongue midline.  head turning and shoulder shrug and were normal and symmetric.Tongue protrusion into cheek strength was normal.  Motor: normal tone, bulk and strength.  Sensory: mildly length dependent decreased to fine touch, pinprick to knee level, preserved vibratory sensation, and proprioception at toes.  Coordination: Normal finger to nose, heel-to-shin bilaterally there was no truncal ataxia  Gait: Rising up from seated position without assistance, normal stance, without trunk ataxia, moderate stride, good arm swing, smooth turning, he has mild tiptoe and heel walking difficulties  Romberg signs: positive.  Deep tendon reflexes: Brachioradialis 2/2, biceps 2/2, triceps 2/2, patellar 2/2, Achilles trace, plantar responses were flexor bilaterally.  Assessment and Plan:    72 years old right-handed Caucasian male, with past medical history of slow progressive length dependent axonal peripheral neuropathy, his symptom is under fair control with gabapentin 600 mg 3 times a day, nortriptyline 25 mg every day, I will refill his medications, return to clinic in one year,

## 2013-03-23 ENCOUNTER — Other Ambulatory Visit: Payer: Self-pay | Admitting: Neurology

## 2013-05-23 ENCOUNTER — Other Ambulatory Visit: Payer: Self-pay | Admitting: Neurology

## 2013-12-15 ENCOUNTER — Other Ambulatory Visit: Payer: Self-pay | Admitting: Neurology

## 2013-12-19 ENCOUNTER — Other Ambulatory Visit: Payer: Self-pay | Admitting: Neurology

## 2014-01-05 ENCOUNTER — Encounter: Payer: Self-pay | Admitting: Podiatry

## 2014-01-05 ENCOUNTER — Ambulatory Visit (INDEPENDENT_AMBULATORY_CARE_PROVIDER_SITE_OTHER): Payer: Medicare Other | Admitting: Podiatry

## 2014-01-05 VITALS — Ht 70.0 in | Wt 220.0 lb

## 2014-01-05 DIAGNOSIS — L600 Ingrowing nail: Secondary | ICD-10-CM

## 2014-01-05 NOTE — Progress Notes (Signed)
   Subjective:    Patient ID: David Li, male    DOB: [birth year], 73 y.o.   MRN: [id number]  HPI Comments: Pt states the big toenails of each foot were removed about 3 years ago.  Now have a thin nail growth over the area and the tip of the toes appear to be growing over that.     Review of Systems  Cardiovascular: Positive for leg swelling.  Musculoskeletal: Positive for arthralgias.  All other systems reviewed and are negative.      Objective:   Physical Exam        Assessment & Plan:

## 2014-01-05 NOTE — Patient Instructions (Signed)

## 2014-01-05 NOTE — Progress Notes (Signed)
Subjective:     Patient ID: David Li, male   DOB: 1940/10/23, 73 y.o.   MRN: [id number]  HPI patient presents with a concern about his big toenails of both feet that had been removed years ago and second nail left is thickened and incurvated and he cannot cut   Review of Systems  All other systems reviewed and are negative.      Objective:   Physical Exam  Nursing note and vitals reviewed. Constitutional: He is oriented to person, place, and time.  Cardiovascular: Intact distal pulses.   Musculoskeletal: Normal range of motion.  Neurological: He is oriented to person, place, and time.  Skin: Skin is warm.   neurovascular status intact with muscle strength adequate and range of motion of the subtalar and midtarsal joint within normal limits. Patient is found to have digits that are well-perfused and a well-developed arch and he is well oriented x3 and is found to have a damaged second nail left is thickened incurvated in the corners and painful when I pressed from a dorsal direction. Hallux nails been satisfactory moved in a small crusted tissue which is localized     Assessment:     Doing excellent from removal of hallux nails of both feet with chronic nail disease and pain and second nail left    Plan:     H&P performed and condition explained. I recommended removal of the second nail left and patient wants this done and I explained the procedure and risk. Patient wants procedure and today I infiltrated 60 mg Xylocaine Marcaine mixture remove the second nail exposed matrix and applied phenol for applications 30 seconds followed by alcohol lavaged sterile dressing and instructions for soaks. Reappoint as needed

## 2014-01-25 ENCOUNTER — Ambulatory Visit
Admission: RE | Admit: 2014-01-25 | Discharge: 2014-01-25 | Disposition: A | Discharge status: Home / Self Care | Payer: Medicare Other | Source: Ambulatory Visit | Attending: Gastroenterology | Admitting: Gastroenterology

## 2014-01-25 ENCOUNTER — Other Ambulatory Visit: Payer: Self-pay | Admitting: Gastroenterology

## 2014-01-25 DIAGNOSIS — K5900 Constipation, unspecified: Secondary | ICD-10-CM

## 2014-02-15 ENCOUNTER — Encounter: Payer: Self-pay | Admitting: Nurse Practitioner

## 2014-03-01 ENCOUNTER — Ambulatory Visit: Payer: Self-pay | Admitting: Nurse Practitioner

## 2014-03-02 ENCOUNTER — Encounter: Payer: Self-pay | Admitting: Nurse Practitioner

## 2014-03-02 ENCOUNTER — Ambulatory Visit (INDEPENDENT_AMBULATORY_CARE_PROVIDER_SITE_OTHER): Payer: Medicare Other | Admitting: Nurse Practitioner

## 2014-03-02 VITALS — BP 142/72 | HR 88 | Temp 97.2°F | Ht 70.0 in | Wt 222.0 lb

## 2014-03-02 DIAGNOSIS — G609 Hereditary and idiopathic neuropathy, unspecified: Secondary | ICD-10-CM

## 2014-03-02 DIAGNOSIS — G250 Essential tremor: Secondary | ICD-10-CM

## 2014-03-02 MED ORDER — GABAPENTIN 600 MG PO TABS: 600.0000 mg | ORAL_TABLET | Freq: Three times a day (TID) | ORAL | Status: DC

## 2014-03-02 MED ORDER — CLOPIDOGREL BISULFATE 75 MG PO TABS: ORAL_TABLET | ORAL | Status: DC

## 2014-03-02 MED ORDER — NORTRIPTYLINE HCL 25 MG PO CAPS: 50.0000 mg | ORAL_CAPSULE | Freq: Every day | ORAL | Status: DC

## 2014-03-02 NOTE — Patient Instructions (Signed)
Continue Plavix, Gabapentin and Nortriptyline. Return to clinic in one year, sooner as needed.   Peripheral Neuropathy Peripheral neuropathy is a type of nerve damage. It affects nerves that carry signals between the spinal cord and other parts of the body. These are called peripheral nerves. With peripheral neuropathy, one nerve or a group of nerves may be damaged.  CAUSES  Many things can damage peripheral nerves. For some people with peripheral neuropathy, the cause is unknown. Some causes include:  Diabetes. This is the most common cause of peripheral neuropathy.  Injury to a nerve.  Pressure or stress on a nerve that lasts a long time.  Too little vitamin B. Alcoholism can lead to this.  Infections.  Autoimmune diseases, such as multiple sclerosis and systemic lupus erythematosus.  Inherited nerve diseases.  Some medicines, such as cancer drugs.  Toxic substances, such as lead and mercury.  Too little blood flowing to the legs.  Kidney disease.  Thyroid disease. SIGNS AND SYMPTOMS  Different people have different symptoms. The symptoms you have will depend on which of your nerves is damaged. Common symptoms include:  Loss of feeling (numbness) in the feet and hands.  Tingling in the feet and hands.  Pain that burns.  Very sensitive skin.  Weakness.  Not being able to move a part of the body (paralysis).  Muscle twitching.  Clumsiness or poor coordination.  Loss of balance.  Not being able to control your bladder.  Feeling dizzy.  Sexual problems. DIAGNOSIS  Peripheral neuropathy is a symptom, not a disease. Finding the cause of peripheral neuropathy can be hard. To figure that out, your health care provider will take a medical history and do a physical exam. A neurological exam will also be done. This involves checking things affected by your brain, spinal cord, and nerves (nervous system). For example, your health care provider will check your  reflexes, how you move, and what you can feel.  Other types of tests may also be ordered, such as:  Blood tests.  A test of the fluid in your spinal cord.  Imaging tests, such as CT scans or an MRI.  Electromyography (EMG). This test checks the nerves that control muscles.  Nerve conduction velocity tests. These tests check how fast messages pass through your nerves.  Nerve biopsy. A small piece of nerve is removed. It is then checked under a microscope. TREATMENT   Medicine is often used to treat peripheral neuropathy. Medicines may include:  Pain-relieving medicines. Prescription or over-the-counter medicine may be suggested.  Antiseizure medicine. This may be used for pain.  Antidepressants. These also may help ease pain from neuropathy.  Lidocaine. This is a numbing medicine. You might wear a patch or be given a shot.  Mexiletine. This medicine is typically used to help control irregular heart rhythms.  Surgery. Surgery may be needed to relieve pressure on a nerve or to destroy a nerve that is causing pain.  Physical therapy to help movement.  Assistive devices to help movement. HOME CARE INSTRUCTIONS   Only take over-the-counter or prescription medicines as directed by your health care provider. Follow the instructions carefully for any given medicines. Do not take any other medicines without first getting approval from your health care provider.  If you have diabetes, work closely with your health care provider to keep your blood sugar under control.  If you have numbness in your feet:  Check every day for signs of injury or infection. Watch for redness, warmth, and swelling.  Wear padded socks and comfortable shoes. These help protect your feet.  Do not do things that put pressure on your damaged nerve.  Do not smoke. Smoking keeps blood from getting to damaged nerves.  Avoid or limit alcohol. Too much alcohol can cause a lack of B vitamins. These vitamins are  needed for healthy nerves.  Develop a good support system. Coping with peripheral neuropathy can be stressful. Talk to a mental health specialist or join a support group if you are struggling.  Follow up with your health care provider as directed. SEEK MEDICAL CARE IF:   You have new signs or symptoms of peripheral neuropathy.  You are struggling emotionally from dealing with peripheral neuropathy.  You have a fever. SEEK IMMEDIATE MEDICAL CARE IF:   You have an injury or infection that is not healing.  You feel very dizzy or begin vomiting.  You have chest pain.  You have trouble breathing. Document Released: 04/18/2002 Document Revised: 01/08/2011 Document Reviewed: 01/03/2013 [employer] Patient Information 2015 [place], [place]. This information is not intended to replace advice given to you by your health care provider. Make sure you discuss any questions you have with your health care provider.

## 2014-03-02 NOTE — Progress Notes (Signed)
PATIENT: David Li DOB: 12/06/1940  REASON FOR VISIT: routine follow up for idiopathic peripheral neuropathy HISTORY FROM: patient  HISTORY OF PRESENT ILLNESS: David Li, is a 73 yo white male return for follow up of idiopathic peripheral neuropathy.   Last visit was 03/01/13 with David Li.  Since last visit patient states that his neuropathy has progressed slightly but he is still comfortable with his current medication regimen of gabapentin 600 mg 3 times a day and nortriptyline 25 mg every night. He feels like his balance is slowly progressively worse and compares his neuropathy with the feeling of walking on pillows. 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 Tolerating Plavix well without any significant bleeding or bruising. He has developed fine hand tremors with outstretched arms left greater than right.  He was followed by David Li since April 2006, when he presented with sensory changes in the lower extremities. NCV and spine imaging were negative.  In August 2006 he was hospitalized with fever and myalgias, and temporal artery biopsy demonstrated vasculitis. He has since been treated with methotrexate and prednisone, with good response of vasculitis. He is no longer on immunosuppressive treatment.  His neuropathy has progressed by exam and followup NCV. Symptoms have been fairly well controlled on gabapentin 600 mg 3 times a day and nortriptyline 25 mg every night. He had a few bouts of double vision a few years ago, thought likely due to vasculitis. He had a spell of dizziness in 2008, negative imaging at that time, seen in the ER by David Li and treated with Plavix for possible TIA.    REVIEW OF SYSTEMS: Full 14 system review of systems performed and notable only for: leg swelling, excessive thirst, numbness, weakness, tremors   ALLERGIES: Allergies  Allergen Reactions  . Other     Muscle relaxant medication    HOME MEDICATIONS: Outpatient Prescriptions Prior  to Visit  Medication Sig Dispense Refill  . ACYCLOVIR PO Take 400 mg by mouth as needed.       . ALPRAZolam (XANAX) 0.5 MG tablet Take 0.5 mg by mouth 3 (three) times daily as needed.      . cholecalciferol (VITAMIN D) 1000 UNITS tablet Take 2,000 Units by mouth daily with lunch.       . Omega-3 Fatty Acids (OMEGA-3 EPA FISH OIL PO) Take 1,200 mg by mouth daily after lunch.       Marland Kitchen omeprazole (PRILOSEC) 20 MG capsule Take 20 mg by mouth daily.      Marland Kitchen zolpidem (AMBIEN) 10 MG tablet Take 2.5 mg by mouth at bedtime as needed.      . clopidogrel (PLAVIX) 75 MG tablet TAKE 1 TABLET EVERY DAY  90 tablet  0  . gabapentin (NEURONTIN) 600 MG tablet Take 1 tablet (600 mg total) by mouth 3 (three) times daily.  90 tablet  12  . nortriptyline (PAMELOR) 25 MG capsule Take 2 capsules (50 mg total) by mouth at bedtime.  60 capsule  12   No facility-administered medications prior to visit.    PHYSICAL EXAM Filed Vitals:   03/02/14 1529  BP: 142/72  Pulse: 88  Temp: 97.2 F (36.2 C)  TempSrc: Oral  Height: 5\' 10"  (1.778 m)  Weight: 222 lb (100.699 kg)   Body mass index is 31.85 kg/(m^2).  Generalized: In no acute distress, pleasant Caucasian male Neck: Supple, no carotid bruits  Cardiac: Regular rate rhythm  Pulmonary: Clear to auscultation bilaterally  Musculoskeletal: No deformity  Neurological examination  Mentation: Alert oriented to time, place, history taking, and casual conversation  Cranial nerve II-XII: Pupils were equal round reactive to light extraocular movements were full, visual field were full on confrontational test. facial sensation and strength were normal. hearing was intact to finger rubbing bilaterally. Uvula tongue midline. head turning and shoulder shrug and were normal and symmetric.Tongue protrusion into cheek strength was normal.  Motor: normal tone, bulk and strength. Fine tremors in outstretched hands, L>R Sensory: mildly length dependent decreased to fine touch,  pinprick to knee level, preserved vibratory sensation, and proprioception at toes.  Coordination: Normal finger to nose, heel-to-shin bilaterally there was no truncal ataxia  Gait: Rising up from seated position without assistance, normal stance, without trunk ataxia, moderate stride, good arm swing, smooth turning, he has mild tiptoe and heel walking difficulties  Romberg signs: positive.  Deep tendon reflexes: Brachioradialis 2/2, biceps 2/2, triceps 2/2, patellar 2/2, Achilles trace, plantar responses were flexor bilaterally.    ASSESSMENT: 73 year old right-handed Caucasian male, with past medical history of slow progressive length dependent axonal peripheral neuropathy, his symptom is under fair control with gabapentin 600 mg 3 times a day, nortriptyline 25 mg every day.  PLAN: Continue Plavix, Gabapentin and Nortriptyline, refills given. Return to clinic in one year, sooner as needed.  Meds ordered this encounter  Medications  . gabapentin (NEURONTIN) 600 MG tablet    Sig: Take 1 tablet (600 mg total) by mouth 3 (three) times daily.    Dispense:  270 tablet    Refill:  3    Order Specific Question:  Supervising Provider    Answer:  David Li [[id number]]  . clopidogrel (PLAVIX) 75 MG tablet    Sig: TAKE 1 TABLET EVERY DAY    Dispense:  90 tablet    Refill:  3    Order Specific Question:  Supervising Provider    Answer:  David Li [[id number]]  . nortriptyline (PAMELOR) 25 MG capsule    Sig: Take 2 capsules (50 mg total) by mouth at bedtime.    Dispense:  180 capsule    Refill:  3    Order Specific Question:  Supervising Provider    Answer:  David Li [[id number]]   Return in about 1 year (around 03/03/2015) for peripheral neuropathy.  David Rummage David Beddow, MSN, FNP-BC, A/GNP-C 03/02/2014, 4:25 PM [facility] [address] [place], [place] [address] [phone]  Note: This document was prepared with digital dictation and possible smart phrase technology. Any  transcriptional errors that result from this process are unintentional.

## 2014-03-05 ENCOUNTER — Other Ambulatory Visit: Payer: Self-pay | Admitting: Neurology

## 2015-01-31 ENCOUNTER — Other Ambulatory Visit: Payer: Self-pay | Admitting: Dermatology

## 2015-03-05 ENCOUNTER — Encounter: Payer: Self-pay | Admitting: Neurology

## 2015-03-05 ENCOUNTER — Ambulatory Visit (INDEPENDENT_AMBULATORY_CARE_PROVIDER_SITE_OTHER): Payer: Medicare Other | Admitting: Neurology

## 2015-03-05 ENCOUNTER — Ambulatory Visit: Payer: Medicare Other | Admitting: Neurology

## 2015-03-05 VITALS — BP 147/89 | HR 76 | Ht 70.0 in | Wt 221.0 lb

## 2015-03-05 DIAGNOSIS — I776 Arteritis, unspecified: Secondary | ICD-10-CM

## 2015-03-05 DIAGNOSIS — G250 Essential tremor: Secondary | ICD-10-CM | POA: Diagnosis not present

## 2015-03-05 DIAGNOSIS — G609 Hereditary and idiopathic neuropathy, unspecified: Secondary | ICD-10-CM

## 2015-03-05 MED ORDER — PREGABALIN 100 MG PO CAPS: 100.0000 mg | ORAL_CAPSULE | Freq: Three times a day (TID) | ORAL | Status: DC

## 2015-03-05 NOTE — Progress Notes (Signed)
Chief Complaint  Patient presents with  . Peripheral Neuropathy    His pain has progressed and he would like to discuss his gabapentin dosage.  He is currently taking 600mg  three times daily.      PATIENT: David Li DOB: 10/11/1940   HISTORY OF PRESENT ILLNESS: [name], is a 74 yo white male return for follow up of idiopathic peripheral neuropathy.   He was followed by Dr. Doy Mince since April 2006, when he presented with sensory changes starting at the soles of his feet, gradually ascending to bilateral lower extremities, fairly symmetric. NCV and spine imaging were negative then.  In August 2006 he was hospitalized with fever and myalgias, and temporal artery biopsy demonstrated vasculitis. He has since been treated with methotrexate and prednisone, with good response of vasculitis. He is no longer on immunosuppressive treatment.  His neuropathy has progressed by exam and followup NCV. Symptoms have been fairly well controlled on gabapentin 600 mg 3 times a day and nortriptyline 25 mg every night. He had a few bouts of double vision a few years ago, thought likely due to vasculitis. He had a spell of dizziness in 2008, negative imaging at that time, seen in the ER by Dr. Erling Cruz and treated with Plavix for possible TIA.   Last visit was in October 2015 by nurse practitioner [name], he continued to complains of slow progression of numbness, tingling, neuropathic pain, now numbness at both mid thigh level. 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 Other     Muscle relaxant medication    HOME MEDICATIONS: Outpatient Prescriptions Prior to Visit  Medication Sig Dispense Refill  . ACYCLOVIR PO Take 400 mg by mouth as needed.     . ALPRAZolam (XANAX) 0.5 MG tablet Take 0.5 mg by mouth 3 (three) times daily as needed.    . cholecalciferol (VITAMIN D) 1000 UNITS tablet Take 2,000 Units by mouth daily with lunch.     . clopidogrel (PLAVIX) 75 MG tablet TAKE 1 TABLET EVERY DAY 90 tablet 3  . gabapentin (NEURONTIN) 600 MG tablet Take 1 tablet (600 mg total) by mouth 3 (three) times daily. 270 tablet 3  . nortriptyline (PAMELOR) 25 MG capsule Take 2 capsules (50 mg total) by mouth at bedtime. 180 capsule 3  . omeprazole (PRILOSEC) 20 MG capsule Take 20 mg by mouth daily.    Marland Kitchen zolpidem (AMBIEN) 10 MG tablet Take 2.5 mg by mouth at bedtime as needed.    . gabapentin (NEURONTIN) 600 MG tablet TAKE 1 TABLET (600 MG TOTAL) BY MOUTH 3 (THREE) TIMES DAILY. 270 tablet 3  . nortriptyline (PAMELOR) 25 MG capsule Take 2 capsules (50 mg total) by mouth at bedtime. 180 capsule 3  . Omega-3 Fatty Acids (OMEGA-3 EPA FISH OIL PO) Take 1,200 mg by mouth daily after lunch.      No facility-administered medications prior to visit.    PHYSICAL EXAM Filed Vitals:   03/05/15 1056  BP: 147/89  Pulse: 76  Height: 5\' 10"  (  1.778 m)  Weight: 221 lb (100.245 kg)   Body mass index is 31.71 kg/(m^2).   PHYSICAL EXAMNIATION:  Gen: NAD, conversant, well nourised, obese, well groomed                     Cardiovascular: Regular rate rhythm, no peripheral edema, warm, nontender. Eyes: Conjunctivae clear without exudates or hemorrhage Neck: Supple, no carotid bruise. Pulmonary: Clear to auscultation bilaterally   NEUROLOGICAL EXAM:  MENTAL STATUS: Speech:    Speech is normal; fluent and spontaneous with normal comprehension.  Cognition:     Orientation to time, place and person     Normal recent and  remote memory     Normal Attention span and concentration     Normal Language, naming, repeating,spontaneous speech     Fund of knowledge   CRANIAL NERVES: CN II: Visual fields are full to confrontation. Fundoscopic exam is normal with sharp discs and no vascular changes. Pupils are round equal and briskly reactive to light. CN III, IV, VI: extraocular movement are normal. No ptosis. CN V: Facial sensation is intact to pinprick in all 3 divisions bilaterally. Corneal responses are intact.  CN VII: Face is symmetric with normal eye closure and smile. CN VIII: Hearing is normal to rubbing fingers CN IX, X: Palate elevates symmetrically. Phonation is normal. CN XI: Head turning and shoulder shrug are intact CN XII: Tongue is midline with normal movements and no atrophy.  MOTOR: There is no pronator drift of out-stretched arms. Muscle bulk and tone are normal. Muscle strength is normal.  REFLEXES: Reflexes are absent. Plantar responses are flexor.  SENSORY: Length dependent decreased to  light touch, pinprick, and vibration sense to knee level  COORDINATION: Rapid alternating movements and fine finger movements are intact. There is no dysmetria on finger-to-nose and heel-knee-shin.    GAIT/STANCE: Posture is normal. He has difficulty with tandem walking. Romberg signs were negative.  ASSESSMENT/Plan: 74 year old right-handed Caucasian male Progressive ascending length dependent peripheral neuropathy,   areflexia on examination, positive Romberg signs,  Repeat EMG nerve conduction study  Laboratory evaluations  Lumbar puncture   If there is elevated CSF protein, may consider IVIG treatment Neuropathic pain  Stopped gabapentin 600 mg 3 times a day  Start Lyrica 100 mg 3 times a day, continue nortriptyline 25 mg daily  Return to clinic in one month   Marcial Pacas, M.D. Ph.D.  [facility] [place], [place] [address] Phone: [phone] Fax:       [phone]

## 2015-03-06 ENCOUNTER — Other Ambulatory Visit: Payer: Self-pay | Admitting: Neurology

## 2015-03-07 ENCOUNTER — Telehealth: Payer: Self-pay | Admitting: Neurology

## 2015-03-07 LAB — CBC
HEMATOCRIT: 43.2 % (ref 37.5–51.0)
Hemoglobin: 14.3 g/dL (ref 12.6–17.7)
MCH: 31 pg (ref 26.6–33.0)
MCHC: 33.1 g/dL (ref 31.5–35.7)
MCV: 94 fL (ref 79–97)
Platelets: 231 10*3/uL (ref 150–379)
RBC: 4.62 x10E6/uL (ref 4.14–5.80)
RDW: 13.8 % (ref 12.3–15.4)
WBC: 6.1 10*3/uL (ref 3.4–10.8)

## 2015-03-07 LAB — VITAMIN B12: VITAMIN B 12: 507 pg/mL (ref 211–946)

## 2015-03-07 LAB — COMPREHENSIVE METABOLIC PANEL
A/G RATIO: 1.5 (ref 1.1–2.5)
ALT: 21 IU/L (ref 0–44)
AST: 22 IU/L (ref 0–40)
Albumin: 4.1 g/dL (ref 3.5–4.8)
Alkaline Phosphatase: 58 IU/L (ref 39–117)
BUN/Creatinine Ratio: 12 (ref 10–22)
BUN: 9 mg/dL (ref 8–27)
Bilirubin Total: 0.5 mg/dL (ref 0.0–1.2)
CALCIUM: 9.4 mg/dL (ref 8.6–10.2)
CO2: 22 mmol/L (ref 18–29)
CREATININE: 0.77 mg/dL (ref 0.76–1.27)
Chloride: 103 mmol/L (ref 97–106)
GFR, EST AFRICAN AMERICAN: 105 mL/min/{1.73_m2} (ref >59)
GFR, EST NON AFRICAN AMERICAN: 91 mL/min/{1.73_m2} (ref >59)
GLOBULIN, TOTAL: 2.7 g/dL (ref 1.5–4.5)
Glucose: 85 mg/dL (ref 65–99)
POTASSIUM: 4.8 mmol/L (ref 3.5–5.2)
SODIUM: 139 mmol/L (ref 136–144)
TOTAL PROTEIN: 6.8 g/dL (ref 6.0–8.5)

## 2015-03-07 LAB — THYROID PANEL WITH TSH
Free Thyroxine Index: 1.5 (ref 1.2–4.9)
T3 Uptake Ratio: 26 % (ref 24–39)
T4 TOTAL: 5.8 ug/dL (ref 4.5–12.0)
TSH: 2.04 u[IU]/mL (ref 0.450–4.500)

## 2015-03-07 LAB — HEMOGLOBIN A1C
Est. average glucose Bld gHb Est-mCnc: 128 mg/dL
Hgb A1c MFr Bld: 6.1 % — ABNORMAL HIGH (ref 4.8–5.6)

## 2015-03-07 LAB — HEPATITIS PANEL, ACUTE
HEP A IGM: NEGATIVE
HEP B C IGM: NEGATIVE
HEP B S AG: NEGATIVE
HEP C VIRUS AB: 0.4 {s_co_ratio} (ref 0.0–0.9)

## 2015-03-07 LAB — CK: CK TOTAL: 48 U/L (ref 24–204)

## 2015-03-07 LAB — PROTEIN ELECTROPHORESIS, SERUM
A/G Ratio: 1.3 (ref 0.7–1.7)
ALBUMIN ELP: 3.8 g/dL (ref 2.9–4.4)
ALPHA 1: 0.2 g/dL (ref 0.0–0.4)
Alpha 2: 0.6 g/dL (ref 0.4–1.0)
Beta: 1.1 g/dL (ref 0.7–1.3)
GAMMA GLOBULIN: 1.2 g/dL (ref 0.4–1.8)
Globulin, Total: 3 g/dL (ref 2.2–3.9)

## 2015-03-07 LAB — ANA W/REFLEX IF POSITIVE: ANA: NEGATIVE

## 2015-03-07 LAB — RPR: RPR Ser Ql: NONREACTIVE

## 2015-03-07 LAB — C-REACTIVE PROTEIN: CRP: 3.3 mg/L (ref 0.0–4.9)

## 2015-03-07 LAB — B. BURGDORFI ANTIBODIES: Lyme IgG/IgM Ab: <0.91 {ISR} (ref 0.00–0.90)

## 2015-03-08 ENCOUNTER — Telehealth: Payer: Self-pay | Admitting: *Deleted

## 2015-03-08 ENCOUNTER — Other Ambulatory Visit: Payer: Self-pay | Admitting: Neurology

## 2015-03-08 NOTE — Telephone Encounter (Signed)
Laboratory showed mild elevated A1c 6.1, otherwise normal lab, he scheduled for lumbar puncture, EMG nerve conduction study,

## 2015-03-08 NOTE — Telephone Encounter (Signed)
Spoke to patient - aware of results. 

## 2015-03-08 NOTE — Telephone Encounter (Signed)
-----   Message from Marcial Pacas, MD sent at 03/07/2015  5:37 PM EDT ----- Please call patient for mild elevated A1c 6.1, otherwise normal laboratory evaluations

## 2015-03-09 ENCOUNTER — Telehealth: Payer: Self-pay | Admitting: Neurology

## 2015-03-09 NOTE — Telephone Encounter (Signed)
Chrys Racer with [facility] called and need medical clearance for pt to come off of Plavix tomorrow for LP on 11/3. He would need to stop the plavix tomorrow if it is ok . Please fax saying that it is ok to [phone] attn: Chrys Racer

## 2015-03-09 NOTE — Telephone Encounter (Signed)
Ok per Dr. Krista Blue for patient to stop Plavix for 5 days for LP.  Orders faxed and confirmed to [facility].

## 2015-03-09 NOTE — Telephone Encounter (Signed)
It is ok to stop Plavix for 5 days prior to LP

## 2015-03-13 ENCOUNTER — Other Ambulatory Visit: Payer: Self-pay

## 2015-03-13 MED ORDER — CLOPIDOGREL BISULFATE 75 MG PO TABS: 75.0000 mg | ORAL_TABLET | Freq: Every day | ORAL | Status: DC

## 2015-03-15 ENCOUNTER — Other Ambulatory Visit: Payer: Medicare Other

## 2015-03-16 ENCOUNTER — Ambulatory Visit
Admission: RE | Admit: 2015-03-16 | Discharge: 2015-03-16 | Disposition: A | Discharge status: Home / Self Care | Payer: Medicare Other | Source: Ambulatory Visit | Attending: Gastroenterology | Admitting: Gastroenterology

## 2015-03-16 ENCOUNTER — Other Ambulatory Visit: Payer: Self-pay | Admitting: Gastroenterology

## 2015-03-16 DIAGNOSIS — K5900 Constipation, unspecified: Secondary | ICD-10-CM

## 2015-03-27 ENCOUNTER — Ambulatory Visit
Admission: RE | Admit: 2015-03-27 | Discharge: 2015-03-27 | Disposition: A | Discharge status: Home / Self Care | Payer: Medicare Other | Source: Ambulatory Visit | Attending: Neurology | Admitting: Neurology

## 2015-03-27 ENCOUNTER — Other Ambulatory Visit: Payer: Self-pay | Admitting: Neurology

## 2015-03-27 DIAGNOSIS — G250 Essential tremor: Secondary | ICD-10-CM

## 2015-03-27 DIAGNOSIS — I776 Arteritis, unspecified: Secondary | ICD-10-CM

## 2015-03-27 DIAGNOSIS — G609 Hereditary and idiopathic neuropathy, unspecified: Secondary | ICD-10-CM

## 2015-03-27 LAB — CSF CELL COUNT WITH DIFFERENTIAL
RBC Count, CSF: 4 cu mm — ABNORMAL HIGH
Tube #: 3
WBC, CSF: 1 cu mm (ref 0–5)

## 2015-03-27 LAB — GLUCOSE, CSF: Glucose, CSF: 64 mg/dL (ref 43–76)

## 2015-03-27 LAB — PROTEIN, CSF: TOTAL PROTEIN, CSF: 63 mg/dL — AB (ref 15–45)

## 2015-03-27 NOTE — Discharge Instructions (Signed)
Lumbar Puncture Discharge Instructions ° °1. Go home and rest quietly for the next 24 hours.  It is important to lie flat for the next 24 hours.  Get up only to go to the restroom.  You may lie in the bed or on a couch on your back, your stomach, your left side or your right side.  You may have one pillow under your head.  You may have pillows between your knees while you are on your side or under your knees while you are on your back. ° °2. DO NOT drive today.  Recline the seat as far back as it will go, while still wearing your seat belt, on the way home. ° °3. You may get up to go to the bathroom as needed.  You may sit up for 10 minutes to eat.  You may resume your normal diet and medications unless otherwise indicated.  Drink lots of extra fluids today and tomorrow. ° °4. The incidence of headache, nausea, or vomiting is about 5% (one in 20 patients).  If you develop a headache, lie flat and drink plenty of fluids until the headache goes away.  Caffeinated beverages may be helpful.  If you develop severe nausea and vomiting or a headache that does not go away with flat bed rest, call the physician who sent you here.  ° °5. You may resume normal activities after your 24 hours of bed rest is over; however, do not exert yourself strongly or do any heavy lifting tomorrow. ° °6. Call your physician for a follow-up appointment.  ° °7. If you have any questions  after you arrive home, please call [phone]. ° °Discharge instructions have been explained to the patient.  The patient, or the person responsible for the patient, fully understands these instructions. ° ° °MAY RESUME PLAVIX TODAY. °

## 2015-03-28 ENCOUNTER — Telehealth: Payer: Self-pay | Admitting: Neurology

## 2015-03-28 LAB — VDRL, CSF: VDRL Quant, CSF: NONREACTIVE

## 2015-03-28 NOTE — Telephone Encounter (Signed)
Spoke to patient - he is aware of results and will keep his pending appt for further discussion. 

## 2015-03-28 NOTE — Telephone Encounter (Signed)
Please call patient, spinal fluid showed elevated total protein of 63, rest of the labs was normal  I will see him at his EMG nerve conduction study, further discuss about his workup result,

## 2015-04-10 ENCOUNTER — Encounter: Payer: Medicare Other | Admitting: Neurology

## 2015-04-12 ENCOUNTER — Ambulatory Visit: Payer: Medicare Other | Admitting: Neurology

## 2015-04-21 LAB — FUNGUS CULTURE W SMEAR: Smear Result: NONE SEEN

## 2015-05-23 ENCOUNTER — Ambulatory Visit (INDEPENDENT_AMBULATORY_CARE_PROVIDER_SITE_OTHER): Payer: Medicare Other | Admitting: Neurology

## 2015-05-23 ENCOUNTER — Ambulatory Visit (INDEPENDENT_AMBULATORY_CARE_PROVIDER_SITE_OTHER): Payer: Self-pay | Admitting: Neurology

## 2015-05-23 DIAGNOSIS — G609 Hereditary and idiopathic neuropathy, unspecified: Secondary | ICD-10-CM | POA: Diagnosis not present

## 2015-05-23 DIAGNOSIS — I776 Arteritis, unspecified: Secondary | ICD-10-CM

## 2015-05-23 DIAGNOSIS — R202 Paresthesia of skin: Secondary | ICD-10-CM

## 2015-05-23 DIAGNOSIS — Z0289 Encounter for other administrative examinations: Secondary | ICD-10-CM

## 2015-05-23 DIAGNOSIS — G250 Essential tremor: Secondary | ICD-10-CM

## 2015-05-28 ENCOUNTER — Ambulatory Visit (INDEPENDENT_AMBULATORY_CARE_PROVIDER_SITE_OTHER): Payer: Medicare Other | Admitting: Neurology

## 2015-05-28 ENCOUNTER — Encounter: Payer: Self-pay | Admitting: Neurology

## 2015-05-28 VITALS — BP 135/75 | HR 62 | Resp 20 | Ht 70.0 in | Wt 193.0 lb

## 2015-05-28 DIAGNOSIS — R202 Paresthesia of skin: Secondary | ICD-10-CM | POA: Insufficient documentation

## 2015-05-28 DIAGNOSIS — M545 Low back pain, unspecified: Secondary | ICD-10-CM | POA: Insufficient documentation

## 2015-05-28 NOTE — Procedures (Signed)
   NCS (NERVE CONDUCTION STUDY) WITH EMG (ELECTROMYOGRAPHY) REPORT   STUDY DATE: Jan 16th 2017 PATIENT NAME: David Li DOB: 28-Feb-1941 MRN: [id number]    TECHNOLOGIST: Laretta Alstrom ELECTROMYOGRAPHER: Marcial Pacas M.D.  CLINICAL INFORMATION:  75 years old male, presenting with gradual onset ascending paresthesia  On examination: He has length dependent sensory changes, decreased vibratory sensation pinprick to midshin, absent bilateral lower extremity including ankle reflexes, no weakness.  FINDINGS: NERVE CONDUCTION STUDY: Bilateral sural sensory responses were normal. Bilateral tibial, right peroneal to EDB motor response showed mildly decreased C map amplitude, with normal distal latency, conduction velocity. Bilateral tibial H reflexes were normal. Left peroneal to EDB motor response was normal.  Left median sensory response showed mildly prolonged peak latency, with preserved snap amplitude. Left median motor responses showed mild to moderately prolonged distal latency, F wave latency, was normal C map amplitude, conduction velocity.  Left ulnar sensory and motor responses were normal.  NEEDLE ELECTROMYOGRAPHY: Selected needle examinations were performed at right lower extremity muscles, right lumbosacral paraspinal muscles.  Needle examination of right tibialis anterior, tibialis posterior, peroneal longus, vastus lateralis, gluteus medius was normal  There was no spontaneous activity at right lumbar sacral paraspinal muscles.  IMPRESSION:   This is a slight abnormal study, there is electrodiagnostic evidence of slight length dependent mild axonal peripheral neuropathy, in addition there is evidence of right median neuropathy across the wrist, consistent with moderate right carpal tunnel syndromes. There is no evidence of active right lumbosacral radiculopathy.  INTERPRETING PHYSICIAN:   Marcial Pacas M.D. Ph.D. [facility] [address] [place], [place] [address] [phone]

## 2015-05-28 NOTE — Progress Notes (Signed)
Electrodiagnostic study today showed evidence of mild length dependent axonal peripheral neuropathy, moderate right carpal tunnel syndromes, there is no evidence of active right lumbosacral radiculopathy.

## 2015-05-28 NOTE — Progress Notes (Signed)
Chief Complaint  Patient presents with  . Peripheral Neuropathy    trying to figure out what works best, gabapentin or lyrica,   Chief Complaint  Patient presents with  . Peripheral Neuropathy    trying to figure out what works best, gabapentin or lyrica,      PATIENT: David Li DOB: 1941/01/31   HISTORY OF PRESENT ILLNESS: David Li, is a 75 yo white male return for follow up of idiopathic peripheral neuropathy.   He was followed by Dr. Doy Mince since April 2006, when he presented with sensory changes starting at the soles of his feet, gradually ascending to bilateral lower extremities, fairly symmetric. NCV and spine imaging were negative then.  In August 2006 he was hospitalized with fever and myalgias, and temporal artery biopsy demonstrated vasculitis. He has since been treated with methotrexate and prednisone, with good response of vasculitis. He is no longer on immunosuppressive treatment.  His neuropathy has progressed by exam and followup NCV. Symptoms have been fairly well controlled on gabapentin 600 mg 3 times a day and nortriptyline 25 mg every night. He had a few bouts of double vision a few years ago, thought likely due to vasculitis. He had a spell of dizziness in 2008, negative imaging at that time, seen in the ER by Dr. Erling Cruz and treated with Plavix for possible TIA.   Last visit was in October 2015 by nurse practitioner [name], he continued to complains of slow progression of numbness, tingling, neuropathic pain, now numbness at both mid thigh level. 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, enjoys reading and learning   He is taking titrating dose of gabapentin, currently gabapentin 600 mg 3 times a day, nortriptyline 25 mg 1 tablet at night  UPDATE May 28 2015: He continues to complains  of burning at the bottom of his feet, he is taking Lyrica, which seems to help, he is planning on to move to gabapentin 600mg  tid to see which one helps him better He has mild low back pain,no radiating pain to his legs, he has to self cath once a day since 2016, this happened following his previous TURP surgery, he has difficulty completely empty his bladder, he also complains of worsening constipation.  He feel hand swollen, tightness, he complains of worsening hand writing. CSF showed total elevated total protein 63, normal glucose, normal cells  EMG nerve conduction study January 2017 showed mild axonal peripheral neuropathy, no evidence of demyelinating features    REVIEW OF SYSTEMS: Full 14 system review of systems performed and notable only for: As above   ALLERGIES: Allergies  Allergen Reactions  . Other     Muscle relaxant medication    HOME MEDICATIONS: Outpatient Prescriptions Prior to Visit  Medication Sig Dispense Refill  . ACYCLOVIR PO Take 400 mg by mouth as needed.     . ALPRAZolam (XANAX) 0.5 MG tablet Take 0.5 mg by mouth 3 (three) times daily as needed.    . cholecalciferol (VITAMIN D) 1000 UNITS tablet Take 2,000 Units by mouth daily with lunch.     . clopidogrel (PLAVIX) 75 MG tablet Take 1 tablet (75 mg total) by mouth daily. 90 tablet 3  . gabapentin (NEURONTIN) 600 MG tablet Take 1 tablet (600 mg total) by mouth 3 (three) times daily. 270 tablet 3  . omeprazole (PRILOSEC) 20  MG capsule Take 20 mg by mouth daily.    . pregabalin (LYRICA) 100 MG capsule Take 1 capsule (100 mg total) by mouth 3 (three) times daily. 90 capsule 5  . zolpidem (AMBIEN) 10 MG tablet Take 2.5 mg by mouth at bedtime as needed.    . nortriptyline (PAMELOR) 25 MG capsule Take 2 capsules (50 mg total) by mouth at bedtime. 180 capsule 3   No facility-administered medications prior to visit.    PHYSICAL EXAM Filed Vitals:   05/28/15 1152  BP: 135/75  Pulse: 62  Resp: 20  Height: 5'  10" (1.778 m)  Weight: 193 lb (87.544 kg)   Body mass index is 27.69 kg/(m^2).   PHYSICAL EXAMNIATION:  Gen: NAD, conversant, well nourised, obese, well groomed                     Cardiovascular: Regular rate rhythm, no peripheral edema, warm, nontender. Eyes: Conjunctivae clear without exudates or hemorrhage Neck: Supple, no carotid bruise. Pulmonary: Clear to auscultation bilaterally   NEUROLOGICAL EXAM:  MENTAL STATUS: Speech:    Speech is normal; fluent and spontaneous with normal comprehension.  Cognition:     Orientation to time, place and person     Normal recent and remote memory     Normal Attention span and concentration     Normal Language, naming, repeating,spontaneous speech     Fund of knowledge   CRANIAL NERVES: CN II: Visual fields are full to confrontation. Fundoscopic exam is normal with sharp discs and no vascular changes. Pupils are round equal and briskly reactive to light. CN III, IV, VI: extraocular movement are normal. No ptosis. CN V: Facial sensation is intact to pinprick in all 3 divisions bilaterally. Corneal responses are intact.  CN VII: Face is symmetric with normal eye closure and smile. CN VIII: Hearing is normal to rubbing fingers CN IX, X: Palate elevates symmetrically. Phonation is normal. CN XI: Head turning and shoulder shrug are intact CN XII: Tongue is midline with normal movements and no atrophy.  MOTOR: There is no pronator drift of out-stretched arms. Muscle bulk and tone are normal. Muscle strength is normal.  REFLEXES: Reflexes are absent. Plantar responses are flexor.  SENSORY: Length dependent decreased to  light touch, pinprick, and vibration sense to knee level  COORDINATION: Rapid alternating movements and fine finger movements are intact. There is no dysmetria on finger-to-nose and heel-knee-shin.    GAIT/STANCE: Posture is normal. He has difficulty with tandem walking. Romberg signs were  negative.  ASSESSMENT/Plan: 75 year old right-handed Caucasian male  Progressive ascending length dependent peripheral neuropathy,   Mild length dependent axonal peripheral neuropathy no evidence of demyelinating features   Differentiation diagnosis also includes lumbar radiculopathy, he wants to hold of MRI of lumbar at this point   Neuropathic pain  Continue neuropathic pain medications, gabapentin 600 mg 3 times a day  Marcial Pacas, M.D. Ph.D.  [facility] [place], [place] [address] Phone: [phone] Fax:      [phone]

## 2015-10-11 ENCOUNTER — Other Ambulatory Visit: Payer: Self-pay | Admitting: Neurology

## 2015-10-11 ENCOUNTER — Encounter: Payer: Self-pay | Admitting: *Deleted

## 2015-11-27 ENCOUNTER — Encounter: Payer: Self-pay | Admitting: Neurology

## 2015-11-27 ENCOUNTER — Ambulatory Visit (INDEPENDENT_AMBULATORY_CARE_PROVIDER_SITE_OTHER): Payer: Medicare Other | Admitting: Neurology

## 2015-11-27 VITALS — BP 128/68 | HR 60 | Ht 70.0 in | Wt 197.8 lb

## 2015-11-27 DIAGNOSIS — G609 Hereditary and idiopathic neuropathy, unspecified: Secondary | ICD-10-CM | POA: Diagnosis not present

## 2015-11-27 MED ORDER — GABAPENTIN 800 MG PO TABS: 800.0000 mg | ORAL_TABLET | Freq: Three times a day (TID) | ORAL | Status: DC

## 2015-11-27 NOTE — Progress Notes (Signed)
Chief Complaint  Patient presents with  . Neuropathic Pain    Reports the burning, pain in his bilateral feet is much worse.  He has also developed symptoms in his hands and arms.  Lyrica has not been as helpful as gabapentin.      PATIENT: David Li DOB: [birth year]   HISTORY OF PRESENT ILLNESS: [name], is a 75 yo white male return for follow up of idiopathic peripheral neuropathy.   He was followed by Dr. Doy Mince since April 2006, when he presented with sensory changes starting at the soles of his feet, gradually ascending to bilateral lower extremities, fairly symmetric. NCV and spine imaging were negative then.  In August 2006 he was hospitalized with fever and myalgias, and temporal artery biopsy demonstrated vasculitis. He has since been treated with methotrexate and prednisone, with good response of vasculitis. He is no longer on immunosuppressive treatment.  His neuropathy has progressed by exam and followup NCV. Symptoms have been fairly well controlled on gabapentin 600 mg 3 times a day and nortriptyline 25 mg every night. He had a few bouts of double vision a few years ago, thought likely due to vasculitis. He had a spell of dizziness in 2008, negative imaging at that time, seen in the ER by Dr. Erling Cruz and treated with Plavix for possible TIA.   Last visit was in October 2015 by nurse practitioner [name], he continued to complains of slow progression of numbness, tingling, neuropathic pain, now numbness at both mid thigh level. 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, enjoys reading and learning   He is taking titrating dose of gabapentin, currently gabapentin 600 mg 3 times a day, nortriptyline 25 mg 1 tablet at night  UPDATE May 28 2015: He continues to complains of burning at the bottom of his  feet, he is taking Lyrica, which seems to help, he is planning on to move to gabapentin 600mg  tid to see which one helps him better He has mild low back pain,no radiating pain to his legs, he has to self cath once a day since 2016, this happened following his previous TURP surgery, he has difficulty completely empty his bladder, he also complains of worsening constipation.  He feel hand swollen, tightness, he complains of worsening hand writing. CSF showed total elevated total protein 63, normal glucose, normal cells  EMG nerve conduction study January 2017 showed mild axonal peripheral neuropathy, no evidence of demyelinating features  UPDATE July 18th 2017: He continue complains of intermittent bilateral feet and hand low-grade paresthesia, despite taking gabapentin 600 mg 3 times a day, denied weakness, mildly difficulty with tandem walking    REVIEW OF SYSTEMS: Full 14 system review of systems performed and notable only for: Numbness, tremor   ALLERGIES: Allergies  Allergen Reactions  . Other     Muscle relaxant medication    HOME MEDICATIONS: Outpatient Prescriptions Prior to Visit  Medication Sig Dispense Refill  . ACYCLOVIR PO Take 400 mg by mouth as needed.     . ALPRAZolam (XANAX) 0.5 MG tablet Take 0.5 mg by mouth 3 (three) times daily as needed.    . cholecalciferol (VITAMIN D) 1000 UNITS tablet Take 2,000 Units by mouth daily with lunch.     . clopidogrel (PLAVIX) 75 MG tablet Take 1 tablet (75 mg total) by mouth daily. 90 tablet  3  . LYRICA 100 MG capsule TAKE ONE CAPSULE 3 TIMES A DAY 90 capsule 1  . omeprazole (PRILOSEC) 20 MG capsule Take 20 mg by mouth daily.    Marland Kitchen zolpidem (AMBIEN) 10 MG tablet Take 2.5 mg by mouth at bedtime as needed.     No facility-administered medications prior to visit.    PHYSICAL EXAM Filed Vitals:   11/27/15 0953  BP: 128/68  Pulse: 60  Height: 5\' 10"  (1.778 m)  Weight: 197 lb 12 oz (89.699 kg)   Body mass index is 28.37  kg/(m^2).   PHYSICAL EXAMNIATION:  Gen: NAD, conversant, well nourised, obese, well groomed                     Cardiovascular: Regular rate rhythm, no peripheral edema, warm, nontender. Eyes: Conjunctivae clear without exudates or hemorrhage Neck: Supple, no carotid bruise. Pulmonary: Clear to auscultation bilaterally   NEUROLOGICAL EXAM:  MENTAL STATUS: Speech:    Speech is normal; fluent and spontaneous with normal comprehension.  Cognition:     Orientation to time, place and person     Normal recent and remote memory     Normal Attention span and concentration     Normal Language, naming, repeating,spontaneous speech     Fund of knowledge   CRANIAL NERVES: CN II: Visual fields are full to confrontation. Fundoscopic exam is normal with sharp discs and no vascular changes. Pupils are round equal and briskly reactive to light. CN III, IV, VI: extraocular movement are normal. No ptosis. CN V: Facial sensation is intact to pinprick in all 3 divisions bilaterally. Corneal responses are intact.  CN VII: Face is symmetric with normal eye closure and smile. CN VIII: Hearing is normal to rubbing fingers CN IX, X: Palate elevates symmetrically. Phonation is normal. CN XI: Head turning and shoulder shrug are intact CN XII: Tongue is midline with normal movements and no atrophy.  MOTOR: There is no pronator drift of out-stretched arms. Muscle bulk and tone are normal. Muscle strength is normal.  REFLEXES: Reflexes are absent. Plantar responses are flexor.  SENSORY: Length dependent decreased to  light touch, pinprick, and vibration sense to knee level  COORDINATION: Rapid alternating movements and fine finger movements are intact. There is no dysmetria on finger-to-nose and heel-knee-shin.    GAIT/STANCE: Posture is normal. He has difficulty with tandem walking. Romberg signs were negative.  ASSESSMENT/Plan: 75 year old right-handed Caucasian male  Progressive ascending  length dependent peripheral neuropathy,   Mild length dependent axonal peripheral neuropathy no evidence of demyelinating features   Differentiation diagnosis also includes lumbar radiculopathy, he wants to hold of MRI of lumbar at this point   Neuropathic pain  Continue neuropathic pain medications,  He was on Lyrica 100 mg 3 times a day for a while, switched to gabapentin there was no significant difference,  Once higher dose of gabapentin I refilled 800 mg 3 times a day, 90 day supply with 4 refill,  Return to clinic in one year with nurse practitioner Marcial Pacas, M.D. Ph.D.  [facility] [place], [place] [address] Phone: [phone] Fax:      [phone]

## 2015-11-28 ENCOUNTER — Ambulatory Visit: Payer: Medicare Other | Admitting: Neurology

## 2016-02-25 ENCOUNTER — Other Ambulatory Visit: Payer: Self-pay | Admitting: Dermatology

## 2016-02-27 ENCOUNTER — Emergency Department (HOSPITAL_COMMUNITY): Payer: Medicare Other

## 2016-02-27 ENCOUNTER — Encounter (HOSPITAL_COMMUNITY): Payer: Self-pay | Admitting: Emergency Medicine

## 2016-02-27 ENCOUNTER — Inpatient Hospital Stay (HOSPITAL_COMMUNITY)
Admission: EM | Admit: 2016-02-27 | Discharge: 2016-03-01 | DRG: 871 | Disposition: A | Discharge status: Home / Self Care | Payer: Medicare Other | Attending: Internal Medicine | Admitting: Internal Medicine

## 2016-02-27 DIAGNOSIS — R7989 Other specified abnormal findings of blood chemistry: Secondary | ICD-10-CM

## 2016-02-27 DIAGNOSIS — I4581 Long QT syndrome: Secondary | ICD-10-CM | POA: Diagnosis present

## 2016-02-27 DIAGNOSIS — E872 Acidosis: Secondary | ICD-10-CM | POA: Diagnosis present

## 2016-02-27 DIAGNOSIS — R0602 Shortness of breath: Secondary | ICD-10-CM | POA: Diagnosis not present

## 2016-02-27 DIAGNOSIS — Z833 Family history of diabetes mellitus: Secondary | ICD-10-CM

## 2016-02-27 DIAGNOSIS — J9601 Acute respiratory failure with hypoxia: Secondary | ICD-10-CM | POA: Diagnosis present

## 2016-02-27 DIAGNOSIS — I5030 Unspecified diastolic (congestive) heart failure: Secondary | ICD-10-CM | POA: Diagnosis present

## 2016-02-27 DIAGNOSIS — A419 Sepsis, unspecified organism: Principal | ICD-10-CM | POA: Diagnosis present

## 2016-02-27 DIAGNOSIS — Z8673 Personal history of transient ischemic attack (TIA), and cerebral infarction without residual deficits: Secondary | ICD-10-CM

## 2016-02-27 DIAGNOSIS — Z9079 Acquired absence of other genital organ(s): Secondary | ICD-10-CM

## 2016-02-27 DIAGNOSIS — Z87891 Personal history of nicotine dependence: Secondary | ICD-10-CM

## 2016-02-27 DIAGNOSIS — R110 Nausea: Secondary | ICD-10-CM | POA: Diagnosis present

## 2016-02-27 DIAGNOSIS — J189 Pneumonia, unspecified organism: Secondary | ICD-10-CM | POA: Diagnosis present

## 2016-02-27 DIAGNOSIS — Z823 Family history of stroke: Secondary | ICD-10-CM

## 2016-02-27 DIAGNOSIS — Z7902 Long term (current) use of antithrombotics/antiplatelets: Secondary | ICD-10-CM

## 2016-02-27 DIAGNOSIS — N390 Urinary tract infection, site not specified: Secondary | ICD-10-CM | POA: Diagnosis present

## 2016-02-27 DIAGNOSIS — J9600 Acute respiratory failure, unspecified whether with hypoxia or hypercapnia: Secondary | ICD-10-CM | POA: Diagnosis present

## 2016-02-27 DIAGNOSIS — R6520 Severe sepsis without septic shock: Secondary | ICD-10-CM | POA: Diagnosis present

## 2016-02-27 DIAGNOSIS — R945 Abnormal results of liver function studies: Secondary | ICD-10-CM

## 2016-02-27 DIAGNOSIS — G609 Hereditary and idiopathic neuropathy, unspecified: Secondary | ICD-10-CM | POA: Diagnosis present

## 2016-02-27 LAB — COMPREHENSIVE METABOLIC PANEL
ALBUMIN: 3.6 g/dL (ref 3.5–5.0)
ALK PHOS: 43 U/L (ref 38–126)
ALT: 27 U/L (ref 17–63)
AST: 50 U/L — AB (ref 15–41)
Anion gap: 9 (ref 5–15)
BILIRUBIN TOTAL: 2.5 mg/dL — AB (ref 0.3–1.2)
BUN: 15 mg/dL (ref 6–20)
CALCIUM: 8.3 mg/dL — AB (ref 8.9–10.3)
CO2: 24 mmol/L (ref 22–32)
Chloride: 100 mmol/L — ABNORMAL LOW (ref 101–111)
Creatinine, Ser: 0.99 mg/dL (ref 0.61–1.24)
GFR calc Af Amer: >60 mL/min (ref >60)
GFR calc non Af Amer: >60 mL/min (ref >60)
GLUCOSE: 174 mg/dL — AB (ref 65–99)
Potassium: 3.7 mmol/L (ref 3.5–5.1)
SODIUM: 133 mmol/L — AB (ref 135–145)
TOTAL PROTEIN: 7.2 g/dL (ref 6.5–8.1)

## 2016-02-27 LAB — CBC
HEMATOCRIT: 43.6 % (ref 39.0–52.0)
Hemoglobin: 15.4 g/dL (ref 13.0–17.0)
MCH: 32.8 pg (ref 26.0–34.0)
MCHC: 35.3 g/dL (ref 30.0–36.0)
MCV: 92.8 fL (ref 78.0–100.0)
Platelets: 179 10*3/uL (ref 150–400)
RBC: 4.7 MIL/uL (ref 4.22–5.81)
RDW: 12.9 % (ref 11.5–15.5)
WBC: 17.6 10*3/uL — ABNORMAL HIGH (ref 4.0–10.5)

## 2016-02-27 LAB — I-STAT TROPONIN, ED: TROPONIN I, POC: 0.05 ng/mL (ref 0.00–0.08)

## 2016-02-27 LAB — LIPASE, BLOOD: Lipase: 13 U/L (ref 11–51)

## 2016-02-27 MED ORDER — ALBUTEROL SULFATE (2.5 MG/3ML) 0.083% IN NEBU
5.0000 mg | INHALATION_SOLUTION | Freq: Once | RESPIRATORY_TRACT | Status: AC
  Administered 2016-02-27: 5 mg via RESPIRATORY_TRACT
  Filled 2016-02-27: qty 6

## 2016-02-27 MED ORDER — IOPAMIDOL (ISOVUE-370) INJECTION 76%
100.0000 mL | Freq: Once | INTRAVENOUS | Status: AC | PRN
  Administered 2016-02-28: 100 mL via INTRAVENOUS

## 2016-02-27 MED ORDER — PROMETHAZINE HCL 25 MG/ML IJ SOLN
12.5000 mg | Freq: Once | INTRAMUSCULAR | Status: AC
  Administered 2016-02-27: 12.5 mg via INTRAVENOUS
  Filled 2016-02-27: qty 1

## 2016-02-27 MED ORDER — METHYLPREDNISOLONE SODIUM SUCC 125 MG IJ SOLR: 125.0000 mg | Freq: Once | INTRAMUSCULAR | Status: DC

## 2016-02-27 MED ORDER — SODIUM CHLORIDE 0.9 % IV BOLUS (SEPSIS)
500.0000 mL | Freq: Once | INTRAVENOUS | Status: AC
  Administered 2016-02-27: 500 mL via INTRAVENOUS

## 2016-02-27 MED ORDER — SODIUM CHLORIDE 0.9 % IV BOLUS (SEPSIS)
1000.0000 mL | Freq: Once | INTRAVENOUS | Status: AC
  Administered 2016-02-27: 1000 mL via INTRAVENOUS

## 2016-02-27 NOTE — ED Triage Notes (Signed)
Pt comes to ed via ems, started having  NVD at 5am this morning, feels and weak and SOB. Lungs clear.  20 in RFA, 500 ns in, 4 mg zofran. No relief.   Still has nausea.   Comes from home, family in room . [address], [place],.  V/s on arrival 166/78, pulse 100, rr 22, sp02 94 on 4 liters/  Sinus tach,

## 2016-02-27 NOTE — ED Notes (Signed)
Bed: WA23 Expected date:  Expected time:  Means of arrival:  Comments: EMS 

## 2016-02-27 NOTE — ED Provider Notes (Signed)
[facility] DEPT Provider Note   CSN: [id number] Arrival date & time: 02/27/16  2128     History   Chief Complaint Chief Complaint  Patient presents with  . Shortness of Breath  . Nausea  . Emesis  . Diarrhea    HPI COLAN HYPPOLITE is a 75 y.o. male.  Mr. Kepp is 75 yo man with PMH vasculitis, idiopathic neuropathy, perforated ulcer (remote), urinary obstruction s/p TURP requiring self cath who presents today with acute onset nausea since 5am this morning with progressive shortness of breath. He is also experiencing small volume diarrhea, loss of appetite, and subjective fevers/chills. He was recently diagnosed and treated for UTI with Macrobid. He denies chest pain, abdominal pain, vomiting, constipation, palpitations, cough, urinary changes, or recent medication changes.    The history is provided by the patient. No language interpreter was used.    Past Medical History:  Diagnosis Date  . BPH (benign prostatic hyperplasia)    Catherize 1-2 times daily.  . Neuromuscular disorder ([place])   . Perforated ulcer ([place])   . Stroke [facility])    pt states the Plavix is preventative due to the family history of stroke    Patient Active Problem List   Diagnosis Date Noted  . Pneumonia 02/28/2016  . Acute respiratory failure with hypoxia ([place]) 02/28/2016  . Nausea without vomiting 02/28/2016  . Acute respiratory failure ([place]) 02/28/2016  . Low back pain 05/28/2015  . Paresthesia 05/28/2015  . Tremor, essential 03/02/2014  . Hereditary and idiopathic peripheral neuropathy 03/01/2013  . Vasculitis ([place]) 03/01/2013    Past Surgical History:  Procedure Laterality Date  . HERNIA REPAIR    . TONSILLECTOMY    . TRANSURETHRAL RESECTION OF PROSTATE         Home Medications    Prior to Admission medications   Medication Sig Start Date End Date Taking? Authorizing Provider  ACYCLOVIR PO Take 400 mg by mouth as needed (breakout).    Yes Historical Provider, MD    cholecalciferol (VITAMIN D) 1000 UNITS tablet Take 2,000 Units by mouth daily with lunch.    Yes Historical Provider, MD  clopidogrel (PLAVIX) 75 MG tablet Take 1 tablet (75 mg total) by mouth daily. 03/13/15  Yes Marcial Pacas, MD  gabapentin (NEURONTIN) 800 MG tablet Take 1 tablet (800 mg total) by mouth 3 (three) times daily. 11/27/15  Yes Marcial Pacas, MD  omeprazole (PRILOSEC) 20 MG capsule Take 20 mg by mouth daily.   Yes Historical Provider, MD  trimethoprim (TRIMPEX) 100 MG tablet Take 1 tablet by mouth daily. 02/06/16  Yes Historical Provider, MD  zolpidem (AMBIEN) 10 MG tablet Take 2.5 mg by mouth at bedtime as needed for sleep.    Yes Historical Provider, MD    Family History Family History  Problem Relation Age of Onset  . Stroke Mother   . Diabetes Mother   . Stroke Father   . Diabetes Father   . Diabetes Sister   . Diabetes Brother     Social History Social History  Substance Use Topics  . Smoking status: Former Research scientist (life sciences)  . Smokeless tobacco: Never Used  . Alcohol use 0.6 oz/week    1 Glasses of wine per week     Comment: daily     Allergies   Other   Review of Systems Review of Systems  Constitutional: Positive for chills, fatigue and fever.  Respiratory: Positive for shortness of breath and wheezing. Negative for cough and chest tightness.   Cardiovascular: Negative  for chest pain, palpitations and leg swelling.  Gastrointestinal: Positive for abdominal pain, diarrhea and nausea. Negative for abdominal distention, constipation and vomiting.  Genitourinary: Positive for difficulty urinating. Negative for frequency and urgency.  Neurological: Positive for tremors. Negative for dizziness and light-headedness.  All other systems reviewed and are negative.    Physical Exam Updated Vital Signs BP 134/76 (BP Location: Left Arm)   Pulse 86   Temp 98.4 F (36.9 C) (Oral)   Resp 19   Ht 5' 9.5" (1.765 m)   Wt 95 kg   SpO2 99%   BMI 30.49 kg/m   Physical Exam   Constitutional: He is oriented to person, place, and time. He appears well-developed and well-nourished. He appears distressed.  HENT:  Head: Normocephalic and atraumatic.  Mouth/Throat: Oropharynx is clear and moist.  Healing biopsy site at apex scalp  Eyes: EOM are normal. Pupils are equal, round, and reactive to light.  Neck: Normal range of motion. Neck supple.  Biopsy site left lateral neck  Cardiovascular: Regular rhythm and intact distal pulses.  Exam reveals no gallop and no friction rub.   No murmur heard. Mildly tachycardic rate  Pulmonary/Chest: He is in respiratory distress. He has wheezes (soft expiratory bilaterally). He has no rales.  Tachypneic and grunting during initial evaluation, speaking in short sentences  Abdominal: Soft. Bowel sounds are normal. He exhibits no distension. There is no tenderness. There is no rebound and no guarding.  Musculoskeletal: Normal range of motion. He exhibits no edema, tenderness or deformity.  Neurological: He is alert and oriented to person, place, and time.  Skin: Skin is warm and dry. Capillary refill takes less than 2 seconds. He is not diaphoretic. No erythema. There is pallor.  Psychiatric: He has a normal mood and affect. His behavior is normal. Thought content normal.     ED Treatments / Results  Labs (all labs ordered are listed, but only abnormal results are displayed) Labs Reviewed  COMPREHENSIVE METABOLIC PANEL - Abnormal; Notable for the following:       Result Value   Sodium 133 (*)    Chloride 100 (*)    Glucose, Bld 174 (*)    Calcium 8.3 (*)    AST 50 (*)    Total Bilirubin 2.5 (*)    All other components within normal limits  CBC - Abnormal; Notable for the following:    WBC 17.6 (*)    All other components within normal limits  URINALYSIS, ROUTINE W REFLEX MICROSCOPIC (NOT AT [facility]) - Abnormal; Notable for the following:    Color, Urine AMBER (*)    APPearance CLOUDY (*)    Specific Gravity, Urine 1.046 (*)     Hgb urine dipstick MODERATE (*)    Protein, ur 30 (*)    All other components within normal limits  URINE MICROSCOPIC-ADD ON - Abnormal; Notable for the following:    Squamous Epithelial / LPF 0-5 (*)    Bacteria, UA FEW (*)    All other components within normal limits  LACTIC ACID, PLASMA - Abnormal; Notable for the following:    Lactic Acid, Venous 2.3 (*)    All other components within normal limits  COMPREHENSIVE METABOLIC PANEL - Abnormal; Notable for the following:    Glucose, Bld 110 (*)    Calcium 7.6 (*)    Total Protein 6.1 (*)    Albumin 3.0 (*)    AST 48 (*)    Alkaline Phosphatase 36 (*)    Total Bilirubin  1.8 (*)    All other components within normal limits  CBC WITH DIFFERENTIAL/PLATELET - Abnormal; Notable for the following:    RBC 4.09 (*)    Platelets 148 (*)    Neutro Abs 8.7 (*)    All other components within normal limits  TROPONIN I - Abnormal; Notable for the following:    Troponin I 0.07 (*)    All other components within normal limits  I-STAT CG4 LACTIC ACID, ED - Abnormal; Notable for the following:    Lactic Acid, Venous 4.21 (*)    All other components within normal limits  URINE CULTURE  CULTURE, BLOOD (ROUTINE X 2)  CULTURE, BLOOD (ROUTINE X 2)  CULTURE, EXPECTORATED SPUTUM-ASSESSMENT  LIPASE, BLOOD  STREP PNEUMONIAE URINARY ANTIGEN  INFLUENZA PANEL BY PCR (TYPE A & B, H1N1)  HIV ANTIBODY (ROUTINE TESTING)  TSH  BRAIN NATRIURETIC PEPTIDE  LACTIC ACID, PLASMA  LEGIONELLA PNEUMOPHILA SEROGP 1 UR AG  CBC  BASIC METABOLIC PANEL  CBC  HEPATIC FUNCTION PANEL  HEPATITIS PANEL, ACUTE  TROPONIN I  MAGNESIUM  I-STAT TROPOININ, ED    EKG  EKG Interpretation None       Radiology Dg Chest 2 View  Result Date: 02/27/2016 CLINICAL DATA:  Acute onset of generalized weakness, shortness of breath, nausea, vomiting and diarrhea. Initial encounter. EXAM: CHEST  2 VIEW COMPARISON:  Chest radiograph performed 08/13/2009 FINDINGS: The lungs  are well-aerated. Vascular congestion is noted. Mild peribronchial thickening is seen. There is no evidence of pleural effusion or pneumothorax. The heart is normal in size; the mediastinal contour is within normal limits. No acute osseous abnormalities are seen. IMPRESSION: Vascular congestion noted.  Mild peribronchial thickening seen. Electronically Signed   By: Garald Balding M.D.   On: 02/27/2016 23:20   Ct Angio Chest Pe W And/or Wo Contrast  Result Date: 02/28/2016 CLINICAL DATA:  Nausea, vomiting and diarrhea since early this morning. Weakness and dyspnea. History of perforated gastric ulcer. EXAM: CT ANGIOGRAPHY CHEST CT ABDOMEN AND PELVIS WITH CONTRAST TECHNIQUE: Multidetector CT imaging of the chest was performed using the standard protocol during bolus administration of intravenous contrast. Multiplanar CT image reconstructions and MIPs were obtained to evaluate the vascular anatomy. Multidetector CT imaging of the abdomen and pelvis was performed using the standard protocol during bolus administration of intravenous contrast. CONTRAST:  100 cc IV Isovue 370 COMPARISON:  CT abdomen and pelvis from 12/26/2004 FINDINGS: CTA CHEST FINDINGS Cardiovascular: Satisfactory opacification of the pulmonary arteries to the segmental level. No evidence of pulmonary embolism. Normal heart size. Trace anterior pericardial effusion. No aortic aneurysm or dissection. Go bold right O all Mediastinum/Nodes: There are nonspecific mildly enlarged mediastinal lymph nodes, the largest appears to be left tracheobronchial at 15 mm short axis with 12 mm subcarinal lymph node also present. Smaller paratracheal lymph nodes are noted bilaterally as well as small prevascular and aortic pulmonary window lymph nodes. Findings could be reactive in etiology. No axillary or supraclavicular lymphadenopathy. Lungs/Pleura: Trace bilateral pleural effusions. Wall no pneumonic consolidation. Left upper lobe anterior subpleural wall  atelectasis suspected. Bibasilar dependent atelectasis is noted at each lung base. Musculoskeletal: No chest wall abnormality. No acute or significant osseous findings. Review of the MIP images confirms the above findings. CT ABDOMEN and PELVIS FINDINGS Hepatobiliary: No focal liver abnormality is seen. No gallstones, gallbladder wall thickening, or biliary dilatation. Pancreas: Unremarkable. No pancreatic ductal dilatation or surrounding inflammatory changes. Spleen: Normal in size without focal abnormality. Adrenals/Urinary Tract: No enhancing mass lesion in  either kidney. No obstructive uropathy. With adrenal glands. Nonspecific mild perinephric fat stranding. Mild circumferential wall thickening of the bladder possibly related to cystitis. Stomach/Bowel: No bowel obstruction or acute inflammation. Nodular densities involving multiple colonic haustra likely represent adherent stool, less likely polyps. Vascular/Lymphatic: No significant vascular findings are present. No enlarged abdominal or pelvic lymph nodes. Reproductive: TURP defect of the prostate Other: No ascites Musculoskeletal: Transitional lumbosacral anatomy. There appears to be L5 spondylolysis without spondylolisthesis. Review of the MIP images confirms the above findings. IMPRESSION: No pulmonary embolus. Mild nonspecific mediastinal lymphadenopathy. Trace bilateral pleural effusions. No acute intra-abdominal pelvic abnormalities. TURP defect of the prostate. L5 spondylolysis. Electronically Signed   By: Ashley Royalty M.D.   On: 02/28/2016 01:05   Ct Abdomen Pelvis W Contrast  Result Date: 02/28/2016 CLINICAL DATA:  Nausea, vomiting and diarrhea since early this morning. Weakness and dyspnea. History of perforated gastric ulcer. EXAM: CT ANGIOGRAPHY CHEST CT ABDOMEN AND PELVIS WITH CONTRAST TECHNIQUE: Multidetector CT imaging of the chest was performed using the standard protocol during bolus administration of intravenous contrast. Multiplanar CT  image reconstructions and MIPs were obtained to evaluate the vascular anatomy. Multidetector CT imaging of the abdomen and pelvis was performed using the standard protocol during bolus administration of intravenous contrast. CONTRAST:  100 cc IV Isovue 370 COMPARISON:  CT abdomen and pelvis from 12/26/2004 FINDINGS: CTA CHEST FINDINGS Cardiovascular: Satisfactory opacification of the pulmonary arteries to the segmental level. No evidence of pulmonary embolism. Normal heart size. Trace anterior pericardial effusion. No aortic aneurysm or dissection. Go bold right O all Mediastinum/Nodes: There are nonspecific mildly enlarged mediastinal lymph nodes, the largest appears to be left tracheobronchial at 15 mm short axis with 12 mm subcarinal lymph node also present. Smaller paratracheal lymph nodes are noted bilaterally as well as small prevascular and aortic pulmonary window lymph nodes. Findings could be reactive in etiology. No axillary or supraclavicular lymphadenopathy. Lungs/Pleura: Trace bilateral pleural effusions. Wall no pneumonic consolidation. Left upper lobe anterior subpleural wall atelectasis suspected. Bibasilar dependent atelectasis is noted at each lung base. Musculoskeletal: No chest wall abnormality. No acute or significant osseous findings. Review of the MIP images confirms the above findings. CT ABDOMEN and PELVIS FINDINGS Hepatobiliary: No focal liver abnormality is seen. No gallstones, gallbladder wall thickening, or biliary dilatation. Pancreas: Unremarkable. No pancreatic ductal dilatation or surrounding inflammatory changes. Spleen: Normal in size without focal abnormality. Adrenals/Urinary Tract: No enhancing mass lesion in either kidney. No obstructive uropathy. With adrenal glands. Nonspecific mild perinephric fat stranding. Mild circumferential wall thickening of the bladder possibly related to cystitis. Stomach/Bowel: No bowel obstruction or acute inflammation. Nodular densities involving  multiple colonic haustra likely represent adherent stool, less likely polyps. Vascular/Lymphatic: No significant vascular findings are present. No enlarged abdominal or pelvic lymph nodes. Reproductive: TURP defect of the prostate Other: No ascites Musculoskeletal: Transitional lumbosacral anatomy. There appears to be L5 spondylolysis without spondylolisthesis. Review of the MIP images confirms the above findings. IMPRESSION: No pulmonary embolus. Mild nonspecific mediastinal lymphadenopathy. Trace bilateral pleural effusions. No acute intra-abdominal pelvic abnormalities. TURP defect of the prostate. L5 spondylolysis. Electronically Signed   By: Ashley Royalty M.D.   On: 02/28/2016 01:05    Procedures Procedures (including critical care time)  Medications Ordered in ED Medications  ondansetron (ZOFRAN) injection 4 mg (4 mg Intravenous Not Given 02/28/16 0615)  gabapentin (NEURONTIN) capsule 800 mg (800 mg Oral Given 02/28/16 2133)  clopidogrel (PLAVIX) tablet 75 mg (75 mg Oral Given 02/28/16 1329)  cholecalciferol (  VITAMIN D) tablet 2,000 Units (2,000 Units Oral Given 02/28/16 1329)  pantoprazole (PROTONIX) EC tablet 40 mg (40 mg Oral Given 02/28/16 1328)  zolpidem (AMBIEN) tablet 2.5 mg (2.5 mg Oral Given 02/28/16 2133)  acetaminophen (TYLENOL) tablet 650 mg (not administered)    Or  acetaminophen (TYLENOL) suppository 650 mg (not administered)  enoxaparin (LOVENOX) injection 40 mg (40 mg Subcutaneous Given 02/28/16 1330)  0.9 %  sodium chloride infusion ( Intravenous New Bag/Given 02/28/16 1453)  cefTRIAXone (ROCEPHIN) 1 g in dextrose 5 % 50 mL IVPB (1 g Intravenous Given 02/28/16 2133)  promethazine (PHENERGAN) injection 12.5 mg (12.5 mg Intravenous Given 02/28/16 0708)  albuterol (PROVENTIL) (2.5 MG/3ML) 0.083% nebulizer solution 5 mg (5 mg Nebulization Given 02/27/16 2228)  promethazine (PHENERGAN) injection 12.5 mg (12.5 mg Intravenous Given 02/27/16 2224)  sodium chloride 0.9 % bolus 500  mL (0 mLs Intravenous Stopped 02/27/16 2314)  sodium chloride 0.9 % bolus 1,000 mL (0 mLs Intravenous Stopped 02/28/16 0334)  iopamidol (ISOVUE-370) 76 % injection 100 mL (100 mLs Intravenous Contrast Given 02/28/16 0002)  cefTRIAXone (ROCEPHIN) 1 g in dextrose 5 % 50 mL IVPB (0 g Intravenous Stopped 02/28/16 0150)  azithromycin (ZITHROMAX) 500 mg in dextrose 5 % 250 mL IVPB (0 mg Intravenous Stopped 02/28/16 0334)  sodium chloride 0.9 % bolus 1,000 mL (0 mLs Intravenous Stopped 02/28/16 0334)    And  sodium chloride 0.9 % bolus 1,000 mL (1,000 mLs Intravenous Not Given 02/28/16 0219)    And  sodium chloride 0.9 % bolus 1,000 mL (0 mLs Intravenous Stopped 02/28/16 0359)     Initial Impression / Assessment and Plan / ED Course  I have reviewed the triage vital signs and the nursing notes.  Pertinent labs & imaging results that were available during my care of the patient were reviewed by me and considered in my medical decision making (see chart for details).  Clinical Course    Mr. Allegro is a 75yo gentleman with PMH vasculitis, neuropathy, perforated ulcer in distant past, recent UTI presenting for acute nausea and progressive respiratory distress of unknown etiology. Patient HDS but with new oxygen requirement. Will obtain CBC, CMP, lipase, troponin, EKG, CXR, urine and blood cultures, CTA chest, CT A/P w contrast, lactic acid. Provided phenergan and albuterol nebulizer, IVF boluses, NPO except ice chips. Found to have lactate of 4.2, sepsis protocol initiated, provided extensive IVF and empiric antibiotics ceftriaxone and azithromycin begun for presumed CAP. Patient will be admitted.   Final Clinical Impressions(s) / ED Diagnoses   Final diagnoses:  Community acquired pneumonia, unspecified laterality  LFT elevation    New Prescriptions Current Discharge Medication List       Asencion Partridge, MD 02/28/16 2137    Drenda Freeze, MD 02/29/16 1459

## 2016-02-28 ENCOUNTER — Encounter (HOSPITAL_COMMUNITY): Payer: Self-pay

## 2016-02-28 ENCOUNTER — Other Ambulatory Visit (HOSPITAL_COMMUNITY): Payer: Medicare Other

## 2016-02-28 DIAGNOSIS — Z7902 Long term (current) use of antithrombotics/antiplatelets: Secondary | ICD-10-CM | POA: Diagnosis not present

## 2016-02-28 DIAGNOSIS — I5030 Unspecified diastolic (congestive) heart failure: Secondary | ICD-10-CM | POA: Diagnosis present

## 2016-02-28 DIAGNOSIS — N390 Urinary tract infection, site not specified: Secondary | ICD-10-CM | POA: Diagnosis present

## 2016-02-28 DIAGNOSIS — I4581 Long QT syndrome: Secondary | ICD-10-CM | POA: Diagnosis present

## 2016-02-28 DIAGNOSIS — J9601 Acute respiratory failure with hypoxia: Secondary | ICD-10-CM

## 2016-02-28 DIAGNOSIS — Z823 Family history of stroke: Secondary | ICD-10-CM | POA: Diagnosis not present

## 2016-02-28 DIAGNOSIS — A419 Sepsis, unspecified organism: Secondary | ICD-10-CM | POA: Diagnosis present

## 2016-02-28 DIAGNOSIS — R0600 Dyspnea, unspecified: Secondary | ICD-10-CM | POA: Diagnosis not present

## 2016-02-28 DIAGNOSIS — J9600 Acute respiratory failure, unspecified whether with hypoxia or hypercapnia: Secondary | ICD-10-CM | POA: Diagnosis present

## 2016-02-28 DIAGNOSIS — J189 Pneumonia, unspecified organism: Secondary | ICD-10-CM | POA: Diagnosis present

## 2016-02-28 DIAGNOSIS — Z8673 Personal history of transient ischemic attack (TIA), and cerebral infarction without residual deficits: Secondary | ICD-10-CM | POA: Diagnosis not present

## 2016-02-28 DIAGNOSIS — R110 Nausea: Secondary | ICD-10-CM | POA: Diagnosis not present

## 2016-02-28 DIAGNOSIS — E872 Acidosis: Secondary | ICD-10-CM | POA: Diagnosis present

## 2016-02-28 DIAGNOSIS — Z833 Family history of diabetes mellitus: Secondary | ICD-10-CM | POA: Diagnosis not present

## 2016-02-28 DIAGNOSIS — G609 Hereditary and idiopathic neuropathy, unspecified: Secondary | ICD-10-CM | POA: Diagnosis present

## 2016-02-28 DIAGNOSIS — Z9079 Acquired absence of other genital organ(s): Secondary | ICD-10-CM | POA: Diagnosis not present

## 2016-02-28 DIAGNOSIS — R0602 Shortness of breath: Secondary | ICD-10-CM | POA: Diagnosis present

## 2016-02-28 DIAGNOSIS — R6520 Severe sepsis without septic shock: Secondary | ICD-10-CM | POA: Diagnosis present

## 2016-02-28 DIAGNOSIS — Z87891 Personal history of nicotine dependence: Secondary | ICD-10-CM | POA: Diagnosis not present

## 2016-02-28 LAB — COMPREHENSIVE METABOLIC PANEL
ALK PHOS: 36 U/L — AB (ref 38–126)
ALT: 25 U/L (ref 17–63)
ANION GAP: 5 (ref 5–15)
AST: 48 U/L — ABNORMAL HIGH (ref 15–41)
Albumin: 3 g/dL — ABNORMAL LOW (ref 3.5–5.0)
BILIRUBIN TOTAL: 1.8 mg/dL — AB (ref 0.3–1.2)
BUN: 14 mg/dL (ref 6–20)
CALCIUM: 7.6 mg/dL — AB (ref 8.9–10.3)
CO2: 25 mmol/L (ref 22–32)
Chloride: 107 mmol/L (ref 101–111)
Creatinine, Ser: 0.91 mg/dL (ref 0.61–1.24)
GFR calc non Af Amer: >60 mL/min (ref >60)
Glucose, Bld: 110 mg/dL — ABNORMAL HIGH (ref 65–99)
Potassium: 4 mmol/L (ref 3.5–5.1)
SODIUM: 137 mmol/L (ref 135–145)
TOTAL PROTEIN: 6.1 g/dL — AB (ref 6.5–8.1)

## 2016-02-28 LAB — CBC WITH DIFFERENTIAL/PLATELET
BASOS ABS: 0 10*3/uL (ref 0.0–0.1)
BASOS PCT: 0 %
Eosinophils Absolute: 0.1 10*3/uL (ref 0.0–0.7)
Eosinophils Relative: 1 %
HEMATOCRIT: 39.2 % (ref 39.0–52.0)
HEMOGLOBIN: 13.1 g/dL (ref 13.0–17.0)
Lymphocytes Relative: 8 %
Lymphs Abs: 0.8 10*3/uL (ref 0.7–4.0)
MCH: 32 pg (ref 26.0–34.0)
MCHC: 33.4 g/dL (ref 30.0–36.0)
MCV: 95.8 fL (ref 78.0–100.0)
Monocytes Absolute: 0.5 10*3/uL (ref 0.1–1.0)
Monocytes Relative: 5 %
NEUTROS ABS: 8.7 10*3/uL — AB (ref 1.7–7.7)
NEUTROS PCT: 86 %
Platelets: 148 10*3/uL — ABNORMAL LOW (ref 150–400)
RBC: 4.09 MIL/uL — ABNORMAL LOW (ref 4.22–5.81)
RDW: 13.3 % (ref 11.5–15.5)
WBC: 10 10*3/uL (ref 4.0–10.5)

## 2016-02-28 LAB — URINE MICROSCOPIC-ADD ON

## 2016-02-28 LAB — URINALYSIS, ROUTINE W REFLEX MICROSCOPIC
Bilirubin Urine: NEGATIVE
Glucose, UA: NEGATIVE mg/dL
Ketones, ur: NEGATIVE mg/dL
LEUKOCYTES UA: NEGATIVE
NITRITE: NEGATIVE
Protein, ur: 30 mg/dL — AB
SPECIFIC GRAVITY, URINE: 1.046 — AB (ref 1.005–1.030)
pH: 6 (ref 5.0–8.0)

## 2016-02-28 LAB — LACTIC ACID, PLASMA
LACTIC ACID, VENOUS: 2.3 mmol/L — AB (ref 0.5–1.9)
Lactic Acid, Venous: 1.7 mmol/L (ref 0.5–1.9)

## 2016-02-28 LAB — I-STAT CG4 LACTIC ACID, ED: Lactic Acid, Venous: 4.21 mmol/L (ref 0.5–1.9)

## 2016-02-28 LAB — BRAIN NATRIURETIC PEPTIDE: B NATRIURETIC PEPTIDE 5: 57.9 pg/mL (ref 0.0–100.0)

## 2016-02-28 LAB — INFLUENZA PANEL BY PCR (TYPE A & B)
H1N1 flu by pcr: NOT DETECTED
INFLAPCR: NEGATIVE
INFLBPCR: NEGATIVE

## 2016-02-28 LAB — HIV ANTIBODY (ROUTINE TESTING W REFLEX): HIV Screen 4th Generation wRfx: NONREACTIVE

## 2016-02-28 LAB — STREP PNEUMONIAE URINARY ANTIGEN: Strep Pneumo Urinary Antigen: NEGATIVE

## 2016-02-28 LAB — TSH: TSH: 0.957 u[IU]/mL (ref 0.350–4.500)

## 2016-02-28 LAB — TROPONIN I: TROPONIN I: 0.07 ng/mL — AB (ref <0.03)

## 2016-02-28 MED ORDER — ENOXAPARIN SODIUM 40 MG/0.4ML ~~LOC~~ SOLN
40.0000 mg | SUBCUTANEOUS | Status: DC
  Administered 2016-02-28 – 2016-03-01 (×3): 40 mg via SUBCUTANEOUS
  Filled 2016-02-28 (×3): qty 0.4

## 2016-02-28 MED ORDER — DEXTROSE 5 % IV SOLN
1.0000 g | Freq: Once | INTRAVENOUS | Status: AC
  Administered 2016-02-28: 1 g via INTRAVENOUS
  Filled 2016-02-28: qty 10

## 2016-02-28 MED ORDER — VITAMIN D3 25 MCG (1000 UNIT) PO TABS
2000.0000 [IU] | ORAL_TABLET | Freq: Every day | ORAL | Status: DC
  Administered 2016-02-28 – 2016-03-01 (×3): 2000 [IU] via ORAL
  Filled 2016-02-28 (×3): qty 2

## 2016-02-28 MED ORDER — ONDANSETRON HCL 4 MG/2ML IJ SOLN
4.0000 mg | Freq: Once | INTRAMUSCULAR | Status: DC
  Filled 2016-02-28: qty 2

## 2016-02-28 MED ORDER — CLOPIDOGREL BISULFATE 75 MG PO TABS
75.0000 mg | ORAL_TABLET | Freq: Every day | ORAL | Status: DC
  Administered 2016-02-28 – 2016-03-01 (×3): 75 mg via ORAL
  Filled 2016-02-28 (×3): qty 1

## 2016-02-28 MED ORDER — ZOLPIDEM TARTRATE 5 MG PO TABS
2.5000 mg | ORAL_TABLET | Freq: Every evening | ORAL | Status: DC | PRN
Start: 2016-02-28 — End: 2016-03-01
  Administered 2016-02-28 – 2016-02-29 (×2): 2.5 mg via ORAL
  Filled 2016-02-28 (×2): qty 1

## 2016-02-28 MED ORDER — GABAPENTIN 400 MG PO CAPS
800.0000 mg | ORAL_CAPSULE | Freq: Three times a day (TID) | ORAL | Status: DC
  Administered 2016-02-28 – 2016-03-01 (×7): 800 mg via ORAL
  Filled 2016-02-28 (×7): qty 2

## 2016-02-28 MED ORDER — ONDANSETRON HCL 4 MG/2ML IJ SOLN: 4.0000 mg | Freq: Four times a day (QID) | INTRAMUSCULAR | Status: DC | PRN

## 2016-02-28 MED ORDER — ACETAMINOPHEN 650 MG RE SUPP: 650.0000 mg | Freq: Four times a day (QID) | RECTAL | Status: DC | PRN

## 2016-02-28 MED ORDER — ACETAMINOPHEN 325 MG PO TABS: 650.0000 mg | ORAL_TABLET | Freq: Four times a day (QID) | ORAL | Status: DC | PRN

## 2016-02-28 MED ORDER — DEXTROSE 5 % IV SOLN
1.0000 g | INTRAVENOUS | Status: DC
  Administered 2016-02-28 – 2016-02-29 (×2): 1 g via INTRAVENOUS
  Filled 2016-02-28 (×2): qty 10

## 2016-02-28 MED ORDER — PROMETHAZINE HCL 25 MG/ML IJ SOLN
12.5000 mg | Freq: Four times a day (QID) | INTRAMUSCULAR | Status: DC | PRN
  Administered 2016-02-28: 12.5 mg via INTRAVENOUS
  Filled 2016-02-28: qty 1

## 2016-02-28 MED ORDER — ONDANSETRON HCL 4 MG PO TABS: 4.0000 mg | ORAL_TABLET | Freq: Four times a day (QID) | ORAL | Status: DC | PRN

## 2016-02-28 MED ORDER — DEXTROSE 5 % IV SOLN
500.0000 mg | Freq: Once | INTRAVENOUS | Status: AC
  Administered 2016-02-28: 500 mg via INTRAVENOUS
  Filled 2016-02-28: qty 500

## 2016-02-28 MED ORDER — PANTOPRAZOLE SODIUM 40 MG PO TBEC
40.0000 mg | DELAYED_RELEASE_TABLET | Freq: Every day | ORAL | Status: DC
  Administered 2016-02-28 – 2016-03-01 (×3): 40 mg via ORAL
  Filled 2016-02-28 (×3): qty 1

## 2016-02-28 MED ORDER — SODIUM CHLORIDE 0.9 % IV BOLUS (SEPSIS)
1000.0000 mL | Freq: Once | INTRAVENOUS | Status: AC
  Administered 2016-02-28: 1000 mL via INTRAVENOUS

## 2016-02-28 MED ORDER — SODIUM CHLORIDE 0.9 % IV SOLN
INTRAVENOUS | Status: AC
  Administered 2016-02-28 – 2016-02-29 (×3): via INTRAVENOUS

## 2016-02-28 MED ORDER — DEXTROSE 5 % IV SOLN: 500.0000 mg | INTRAVENOUS | Status: DC

## 2016-02-28 MED ORDER — SODIUM CHLORIDE 0.9 % IV BOLUS (SEPSIS)
1000.0000 mL | Freq: Once | INTRAVENOUS | Status: AC
Start: 2016-02-28 — End: 2016-02-28
  Administered 2016-02-28: 1000 mL via INTRAVENOUS

## 2016-02-28 NOTE — H&P (Signed)
History and Physical    David Li [id number] DOB: Mar 10, 1941 DOA: 02/27/2016  PCP: Lilian Coma, MD  Patient coming from: Home.  Chief Complaint: Nausea and shortness of breath.  HPI: David Li is a 75 y.o. male with history of neuropathy presents to the ER because of persistent nausea and shortness of breath. Patient's symptoms started yesterday morning. Patient also had 2 episodes of diarrhea. In the ER and CT of the chest and abdomen was done which shows bilateral pleural effusion otherwise unremarkable. Patient denies any chest pain productive cough abdominal pain. Had some subjective feeling of chills. In ER patient had blood work done which showed leukocytosis with elevated lactate around 4. Patient also was found to be easily desaturating for which patient was placed on oxygen. Patient was given 3 L fluid bolus started on empiric antibiotics for possible pneumonia.  ED Course: Patient was given 3 L fluid bolus in the ER. Started on empiric antibiotics for possible pneumonia. Had CT angiogram of the chest and abdomen and pelvis.  Review of Systems: As per HPI, rest all negative.   Past Medical History:  Diagnosis Date  . BPH (benign prostatic hyperplasia)    Catherize 1-2 times daily.  . Neuromuscular disorder ([place])   . Perforated ulcer ([place])   . Stroke [facility])    pt states the Plavix is preventative due to the family history of stroke    Past Surgical History:  Procedure Laterality Date  . HERNIA REPAIR    . TONSILLECTOMY    . TRANSURETHRAL RESECTION OF PROSTATE       reports that he has quit smoking. He has never used smokeless tobacco. He reports that he drinks about 0.6 oz of alcohol per week . He reports that he does not use drugs.  Allergies  Allergen Reactions  . Other     Muscle relaxant medication    Family History  Problem Relation Age of Onset  . Stroke Mother   . Diabetes Mother   . Stroke Father   . Diabetes Father   . Diabetes  Sister   . Diabetes Brother     Prior to Admission medications   Medication Sig Start Date End Date Taking? Authorizing Provider  ACYCLOVIR PO Take 400 mg by mouth as needed (breakout).    Yes Historical Provider, MD  cholecalciferol (VITAMIN D) 1000 UNITS tablet Take 2,000 Units by mouth daily with lunch.    Yes Historical Provider, MD  clopidogrel (PLAVIX) 75 MG tablet Take 1 tablet (75 mg total) by mouth daily. 03/13/15  Yes Marcial Pacas, MD  gabapentin (NEURONTIN) 800 MG tablet Take 1 tablet (800 mg total) by mouth 3 (three) times daily. 11/27/15  Yes Marcial Pacas, MD  omeprazole (PRILOSEC) 20 MG capsule Take 20 mg by mouth daily.   Yes Historical Provider, MD  trimethoprim (TRIMPEX) 100 MG tablet Take 1 tablet by mouth daily. 02/06/16  Yes Historical Provider, MD  zolpidem (AMBIEN) 10 MG tablet Take 2.5 mg by mouth at bedtime as needed for sleep.    Yes Historical Provider, MD    Physical Exam: Vitals:   02/28/16 0100 02/28/16 0130 02/28/16 0401 02/28/16 0445  BP: 117/66 116/61 125/75 134/70  Pulse: 94 92 89 89  Resp: 23 (!) 28 25 (!) 22  Temp:   97.8 F (36.6 C) 98.1 F (36.7 C)  TempSrc:   Oral Oral  SpO2: 95% 97% 96% 97%  Weight:    95 kg (209 lb 8 oz)  Height:    5' 9.5" (1.765 m)      Constitutional: Moderately built and nourished. Vitals:   02/28/16 0100 02/28/16 0130 02/28/16 0401 02/28/16 0445  BP: 117/66 116/61 125/75 134/70  Pulse: 94 92 89 89  Resp: 23 (!) 28 25 (!) 22  Temp:   97.8 F (36.6 C) 98.1 F (36.7 C)  TempSrc:   Oral Oral  SpO2: 95% 97% 96% 97%  Weight:    95 kg (209 lb 8 oz)  Height:    5' 9.5" (1.765 m)   Eyes: Anicteric. No pallor. ENMT: No discharge from the ears eyes nose and mouth. Neck: No mass felt. No JVD appreciated. Respiratory: No rhonchi or crepitations. Bilateral air entry present. Cardiovascular: S1 and S2 heard. No murmur appreciated. Abdomen: Soft nontender bowel sounds present. No guarding or rigidity. Musculoskeletal: No edema.  No joint effusion. Skin: No rash. Skin appears warm. Neurologic: Alert awake oriented to time place and person. Moves all extremities. Psychiatric: Appears normal.   Labs on Admission: I have personally reviewed following labs and imaging studies  CBC:  Recent Labs Lab 02/27/16 2212  WBC 17.6*  HGB 15.4  HCT 43.6  MCV 92.8  PLT 0000000   Basic Metabolic Panel:  Recent Labs Lab 02/27/16 2212  NA 133*  K 3.7  CL 100*  CO2 24  GLUCOSE 174*  BUN 15  CREATININE 0.99  CALCIUM 8.3*   GFR: Estimated Creatinine Clearance: 76.2 mL/min (by C-G formula based on SCr of 0.99 mg/dL). Liver Function Tests:  Recent Labs Lab 02/27/16 2212  AST 50*  ALT 27  ALKPHOS 43  BILITOT 2.5*  PROT 7.2  ALBUMIN 3.6    Recent Labs Lab 02/27/16 2212  LIPASE 13   No results for input(s): AMMONIA in the last 168 hours. Coagulation Profile: No results for input(s): INR, PROTIME in the last 168 hours. Cardiac Enzymes: No results for input(s): CKTOTAL, CKMB, CKMBINDEX, TROPONINI in the last 168 hours. BNP (last 3 results) No results for input(s): PROBNP in the last 8760 hours. HbA1C: No results for input(s): HGBA1C in the last 72 hours. CBG: No results for input(s): GLUCAP in the last 168 hours. Lipid Profile: No results for input(s): CHOL, HDL, LDLCALC, TRIG, CHOLHDL, LDLDIRECT in the last 72 hours. Thyroid Function Tests: No results for input(s): TSH, T4TOTAL, FREET4, T3FREE, THYROIDAB in the last 72 hours. Anemia Panel: No results for input(s): VITAMINB12, FOLATE, FERRITIN, TIBC, IRON, RETICCTPCT in the last 72 hours. Urine analysis:    Component Value Date/Time   COLORURINE AMBER (A) 02/28/2016 0050   APPEARANCEUR CLOUDY (A) 02/28/2016 0050   LABSPEC 1.046 (H) 02/28/2016 0050   PHURINE 6.0 02/28/2016 0050   GLUCOSEU NEGATIVE 02/28/2016 0050   HGBUR MODERATE (A) 02/28/2016 0050   BILIRUBINUR NEGATIVE 02/28/2016 0050   KETONESUR NEGATIVE 02/28/2016 0050   PROTEINUR 30 (A)  02/28/2016 0050   UROBILINOGEN 0.2 02/17/2007 0752   NITRITE NEGATIVE 02/28/2016 0050   LEUKOCYTESUR NEGATIVE 02/28/2016 0050   Sepsis Labs: @LABRCNTIP (procalcitonin:4,lacticidven:4) )No results found for this or any previous visit (from the past 240 hour(s)).   Radiological Exams on Admission: Dg Chest 2 View  Result Date: 02/27/2016 CLINICAL DATA:  Acute onset of generalized weakness, shortness of breath, nausea, vomiting and diarrhea. Initial encounter. EXAM: CHEST  2 VIEW COMPARISON:  Chest radiograph performed 08/13/2009 FINDINGS: The lungs are well-aerated. Vascular congestion is noted. Mild peribronchial thickening is seen. There is no evidence of pleural effusion or pneumothorax. The heart is normal in  size; the mediastinal contour is within normal limits. No acute osseous abnormalities are seen. IMPRESSION: Vascular congestion noted.  Mild peribronchial thickening seen. Electronically Signed   By: Garald Balding M.D.   On: 02/27/2016 23:20   Ct Angio Chest Pe W And/or Wo Contrast  Result Date: 02/28/2016 CLINICAL DATA:  Nausea, vomiting and diarrhea since early this morning. Weakness and dyspnea. History of perforated gastric ulcer. EXAM: CT ANGIOGRAPHY CHEST CT ABDOMEN AND PELVIS WITH CONTRAST TECHNIQUE: Multidetector CT imaging of the chest was performed using the standard protocol during bolus administration of intravenous contrast. Multiplanar CT image reconstructions and MIPs were obtained to evaluate the vascular anatomy. Multidetector CT imaging of the abdomen and pelvis was performed using the standard protocol during bolus administration of intravenous contrast. CONTRAST:  100 cc IV Isovue 370 COMPARISON:  CT abdomen and pelvis from 12/26/2004 FINDINGS: CTA CHEST FINDINGS Cardiovascular: Satisfactory opacification of the pulmonary arteries to the segmental level. No evidence of pulmonary embolism. Normal heart size. Trace anterior pericardial effusion. No aortic aneurysm or  dissection. Go bold right O all Mediastinum/Nodes: There are nonspecific mildly enlarged mediastinal lymph nodes, the largest appears to be left tracheobronchial at 15 mm short axis with 12 mm subcarinal lymph node also present. Smaller paratracheal lymph nodes are noted bilaterally as well as small prevascular and aortic pulmonary window lymph nodes. Findings could be reactive in etiology. No axillary or supraclavicular lymphadenopathy. Lungs/Pleura: Trace bilateral pleural effusions. Wall no pneumonic consolidation. Left upper lobe anterior subpleural wall atelectasis suspected. Bibasilar dependent atelectasis is noted at each lung base. Musculoskeletal: No chest wall abnormality. No acute or significant osseous findings. Review of the MIP images confirms the above findings. CT ABDOMEN and PELVIS FINDINGS Hepatobiliary: No focal liver abnormality is seen. No gallstones, gallbladder wall thickening, or biliary dilatation. Pancreas: Unremarkable. No pancreatic ductal dilatation or surrounding inflammatory changes. Spleen: Normal in size without focal abnormality. Adrenals/Urinary Tract: No enhancing mass lesion in either kidney. No obstructive uropathy. With adrenal glands. Nonspecific mild perinephric fat stranding. Mild circumferential wall thickening of the bladder possibly related to cystitis. Stomach/Bowel: No bowel obstruction or acute inflammation. Nodular densities involving multiple colonic haustra likely represent adherent stool, less likely polyps. Vascular/Lymphatic: No significant vascular findings are present. No enlarged abdominal or pelvic lymph nodes. Reproductive: TURP defect of the prostate Other: No ascites Musculoskeletal: Transitional lumbosacral anatomy. There appears to be L5 spondylolysis without spondylolisthesis. Review of the MIP images confirms the above findings. IMPRESSION: No pulmonary embolus. Mild nonspecific mediastinal lymphadenopathy. Trace bilateral pleural effusions. No acute  intra-abdominal pelvic abnormalities. TURP defect of the prostate. L5 spondylolysis. Electronically Signed   By: Ashley Royalty M.D.   On: 02/28/2016 01:05   Ct Abdomen Pelvis W Contrast  Result Date: 02/28/2016 CLINICAL DATA:  Nausea, vomiting and diarrhea since early this morning. Weakness and dyspnea. History of perforated gastric ulcer. EXAM: CT ANGIOGRAPHY CHEST CT ABDOMEN AND PELVIS WITH CONTRAST TECHNIQUE: Multidetector CT imaging of the chest was performed using the standard protocol during bolus administration of intravenous contrast. Multiplanar CT image reconstructions and MIPs were obtained to evaluate the vascular anatomy. Multidetector CT imaging of the abdomen and pelvis was performed using the standard protocol during bolus administration of intravenous contrast. CONTRAST:  100 cc IV Isovue 370 COMPARISON:  CT abdomen and pelvis from 12/26/2004 FINDINGS: CTA CHEST FINDINGS Cardiovascular: Satisfactory opacification of the pulmonary arteries to the segmental level. No evidence of pulmonary embolism. Normal heart size. Trace anterior pericardial effusion. No aortic aneurysm or dissection. Go  bold right O all Mediastinum/Nodes: There are nonspecific mildly enlarged mediastinal lymph nodes, the largest appears to be left tracheobronchial at 15 mm short axis with 12 mm subcarinal lymph node also present. Smaller paratracheal lymph nodes are noted bilaterally as well as small prevascular and aortic pulmonary window lymph nodes. Findings could be reactive in etiology. No axillary or supraclavicular lymphadenopathy. Lungs/Pleura: Trace bilateral pleural effusions. Wall no pneumonic consolidation. Left upper lobe anterior subpleural wall atelectasis suspected. Bibasilar dependent atelectasis is noted at each lung base. Musculoskeletal: No chest wall abnormality. No acute or significant osseous findings. Review of the MIP images confirms the above findings. CT ABDOMEN and PELVIS FINDINGS Hepatobiliary: No  focal liver abnormality is seen. No gallstones, gallbladder wall thickening, or biliary dilatation. Pancreas: Unremarkable. No pancreatic ductal dilatation or surrounding inflammatory changes. Spleen: Normal in size without focal abnormality. Adrenals/Urinary Tract: No enhancing mass lesion in either kidney. No obstructive uropathy. With adrenal glands. Nonspecific mild perinephric fat stranding. Mild circumferential wall thickening of the bladder possibly related to cystitis. Stomach/Bowel: No bowel obstruction or acute inflammation. Nodular densities involving multiple colonic haustra likely represent adherent stool, less likely polyps. Vascular/Lymphatic: No significant vascular findings are present. No enlarged abdominal or pelvic lymph nodes. Reproductive: TURP defect of the prostate Other: No ascites Musculoskeletal: Transitional lumbosacral anatomy. There appears to be L5 spondylolysis without spondylolisthesis. Review of the MIP images confirms the above findings. IMPRESSION: No pulmonary embolus. Mild nonspecific mediastinal lymphadenopathy. Trace bilateral pleural effusions. No acute intra-abdominal pelvic abnormalities. TURP defect of the prostate. L5 spondylolysis. Electronically Signed   By: Ashley Royalty M.D.   On: 02/28/2016 01:05    EKG: Independently reviewed. Normal sinus rhythm.  Assessment/Plan Principal Problem:   Acute respiratory failure with hypoxia (HCC) Active Problems:   Hereditary and idiopathic peripheral neuropathy   Pneumonia   Nausea without vomiting   Acute respiratory failure ([place])    1. Acute respiratory failure with hypoxia - cause not clear. Lactate levels were markedly elevated and CT shows bilateral effusion. Patient at this time is empirically placed on antibiotics. Check BNP troponin 2-D echo and will continue antibiotics for now and check urine Legionella strep antigen and blood cultures. 2. Persistent nausea - CT abdomen does not show any definite cause for  patient's nausea. Continue with when necessary Phenergan. If there is any further episodes of diarrhea will need to check stool studies. Has elevated bilirubin, follow LFTs. 3. History of neuropathy on gabapentin. 4. History of vasculitis - was treated on steroids for 2 years as per the patient, more than 10 years ago. 5. Patient on Plavix for stroke prevention.   DVT prophylaxis: Lovenox. Code Status: Full code.  Family Communication: Discussed with patient.  Disposition Plan: Home.  Consults called: None.  Admission status: Inpatient. Telemetry.    Rise Patience MD [facility] Pager [phone].  If 7PM-7AM, please contact night-coverage [email or web address] Password [email or web address]  02/28/2016, 6:12 AM

## 2016-02-28 NOTE — Progress Notes (Signed)
Oxygen saturations 90% on RA while ambulating

## 2016-02-28 NOTE — ED Notes (Signed)
Called floor timer started pt up by 4:26

## 2016-02-28 NOTE — ED Notes (Signed)
Lab called lactic 2.3

## 2016-02-28 NOTE — ED Notes (Signed)
Informed Dr. Darl Householder of lactic acid of 4.21 @ 00:20

## 2016-02-28 NOTE — Progress Notes (Addendum)
PROGRESS NOTE  David Li [id number] DOB: 12-Sep-1940 DOA: 02/27/2016 PCP: Lilian Coma, MD  HPI/Recap of past 24 hours:  Feeling better, eating lunch, last required meds for nausea was around 10am, denies pain, no cough, on 2liter oxygen, denies sob,  Wife in room  Assessment/Plan: Principal Problem:   Acute respiratory failure with hypoxia ([place]) Active Problems:   Hereditary and idiopathic peripheral neuropathy   Pneumonia   Nausea without vomiting   Acute respiratory failure (HCC)  Acute respiratory failure with hypoxia, leukocytosis, lactic acidosis , possibly from undertreated uti, and patient likely came in with severe sepsis from under treated uti. CTA no PE, no pneumonia. Echo pending he actually responded to current treatment very quick, he looks much better today, leukocytosis /lactic acidosis resolved, will continue iv abx, ivf for today, culture sent on admission, not sure will yield anything, since he has already been on abx, will try to get urine culture obtained from pmd's office.   N/V from undertreated uti? CT Ab/pel no acute findings, Seems has resolved, now tolerating diet. No diarrhea, no abdominal pain  Elevation of lft: ct ab liver and gallbladder unremarkable, lipase wnl. Will get abdominal US, hepatitis panel. He does drink one glass of wine daily,though lft elevation likely from sepsis from undertreated uti.  QTc prolongation: QTc 558 , d/c zithromax,. Repeat ekg in am  Neurogenic bladder vs chronic urinary retention from prostate issues, patient has been doing self cath daily for a  Year.   H/o neuropathy on neurontin, remote h/o vasculitis ( more than 18yrs ago) was on steroids for 2 years  On plavix for stroke prevention, no personal h/o stroke but does has family h/o stroke per patient.   Code Status: full  Family Communication: patient and wife at bedside  Disposition Plan: home in 1-2  days   Consultants:  none  Procedures:  none  Antibiotics:  Rocephin from admission  zithro from admission to 10/19   Objective: BP 134/76 (BP Location: Left Arm)   Pulse 86   Temp 98.4 F (36.9 C) (Oral)   Resp 19   Ht 5' 9.5" (1.765 m)   Wt 95 kg (209 lb 8 oz)   SpO2 99%   BMI 30.49 kg/m   Intake/Output Summary (Last 24 hours) at 02/28/16 1541 Last data filed at 02/28/16 1100  Gross per 24 hour  Intake              240 ml  Output                0 ml  Net              240 ml   Filed Weights   02/28/16 0445  Weight: 95 kg (209 lb 8 oz)    Exam:   General:  NAD  Cardiovascular: RRR  Respiratory: CTABL  Abdomen: Soft/ND/NT, positive BS  Musculoskeletal: No Edema  Neuro: aaox3  Data Reviewed: Basic Metabolic Panel:  Recent Labs Lab 02/27/16 2212 02/28/16 0721  NA 133* 137  K 3.7 4.0  CL 100* 107  CO2 24 25  GLUCOSE 174* 110*  BUN 15 14  CREATININE 0.99 0.91  CALCIUM 8.3* 7.6*   Liver Function Tests:  Recent Labs Lab 02/27/16 2212 02/28/16 0721  AST 50* 48*  ALT 27 25  ALKPHOS 43 36*  BILITOT 2.5* 1.8*  PROT 7.2 6.1*  ALBUMIN 3.6 3.0*    Recent Labs Lab 02/27/16 2212  LIPASE 13   No results  for input(s): AMMONIA in the last 168 hours. CBC:  Recent Labs Lab 02/27/16 2212 02/28/16 0721  WBC 17.6* 10.0  NEUTROABS  --  8.7*  HGB 15.4 13.1  HCT 43.6 39.2  MCV 92.8 95.8  PLT 179 148*   Cardiac Enzymes:    Recent Labs Lab 02/28/16 0721  TROPONINI 0.07*   BNP (last 3 results)  Recent Labs  02/28/16 0721  BNP 57.9    ProBNP (last 3 results) No results for input(s): PROBNP in the last 8760 hours.  CBG: No results for input(s): GLUCAP in the last 168 hours.  No results found for this or any previous visit (from the past 240 hour(s)).   Studies: Dg Chest 2 View  Result Date: 02/27/2016 CLINICAL DATA:  Acute onset of generalized weakness, shortness of breath, nausea, vomiting and diarrhea. Initial  encounter. EXAM: CHEST  2 VIEW COMPARISON:  Chest radiograph performed 08/13/2009 FINDINGS: The lungs are well-aerated. Vascular congestion is noted. Mild peribronchial thickening is seen. There is no evidence of pleural effusion or pneumothorax. The heart is normal in size; the mediastinal contour is within normal limits. No acute osseous abnormalities are seen. IMPRESSION: Vascular congestion noted.  Mild peribronchial thickening seen. Electronically Signed   By: Garald Balding M.D.   On: 02/27/2016 23:20   Ct Angio Chest Pe W And/or Wo Contrast  Result Date: 02/28/2016 CLINICAL DATA:  Nausea, vomiting and diarrhea since early this morning. Weakness and dyspnea. History of perforated gastric ulcer. EXAM: CT ANGIOGRAPHY CHEST CT ABDOMEN AND PELVIS WITH CONTRAST TECHNIQUE: Multidetector CT imaging of the chest was performed using the standard protocol during bolus administration of intravenous contrast. Multiplanar CT image reconstructions and MIPs were obtained to evaluate the vascular anatomy. Multidetector CT imaging of the abdomen and pelvis was performed using the standard protocol during bolus administration of intravenous contrast. CONTRAST:  100 cc IV Isovue 370 COMPARISON:  CT abdomen and pelvis from 12/26/2004 FINDINGS: CTA CHEST FINDINGS Cardiovascular: Satisfactory opacification of the pulmonary arteries to the segmental level. No evidence of pulmonary embolism. Normal heart size. Trace anterior pericardial effusion. No aortic aneurysm or dissection. Go bold right O all Mediastinum/Nodes: There are nonspecific mildly enlarged mediastinal lymph nodes, the largest appears to be left tracheobronchial at 15 mm short axis with 12 mm subcarinal lymph node also present. Smaller paratracheal lymph nodes are noted bilaterally as well as small prevascular and aortic pulmonary window lymph nodes. Findings could be reactive in etiology. No axillary or supraclavicular lymphadenopathy. Lungs/Pleura: Trace  bilateral pleural effusions. Wall no pneumonic consolidation. Left upper lobe anterior subpleural wall atelectasis suspected. Bibasilar dependent atelectasis is noted at each lung base. Musculoskeletal: No chest wall abnormality. No acute or significant osseous findings. Review of the MIP images confirms the above findings. CT ABDOMEN and PELVIS FINDINGS Hepatobiliary: No focal liver abnormality is seen. No gallstones, gallbladder wall thickening, or biliary dilatation. Pancreas: Unremarkable. No pancreatic ductal dilatation or surrounding inflammatory changes. Spleen: Normal in size without focal abnormality. Adrenals/Urinary Tract: No enhancing mass lesion in either kidney. No obstructive uropathy. With adrenal glands. Nonspecific mild perinephric fat stranding. Mild circumferential wall thickening of the bladder possibly related to cystitis. Stomach/Bowel: No bowel obstruction or acute inflammation. Nodular densities involving multiple colonic haustra likely represent adherent stool, less likely polyps. Vascular/Lymphatic: No significant vascular findings are present. No enlarged abdominal or pelvic lymph nodes. Reproductive: TURP defect of the prostate Other: No ascites Musculoskeletal: Transitional lumbosacral anatomy. There appears to be L5 spondylolysis without spondylolisthesis. Review of the  MIP images confirms the above findings. IMPRESSION: No pulmonary embolus. Mild nonspecific mediastinal lymphadenopathy. Trace bilateral pleural effusions. No acute intra-abdominal pelvic abnormalities. TURP defect of the prostate. L5 spondylolysis. Electronically Signed   By: Ashley Royalty M.D.   On: 02/28/2016 01:05   Ct Abdomen Pelvis W Contrast  Result Date: 02/28/2016 CLINICAL DATA:  Nausea, vomiting and diarrhea since early this morning. Weakness and dyspnea. History of perforated gastric ulcer. EXAM: CT ANGIOGRAPHY CHEST CT ABDOMEN AND PELVIS WITH CONTRAST TECHNIQUE: Multidetector CT imaging of the chest was  performed using the standard protocol during bolus administration of intravenous contrast. Multiplanar CT image reconstructions and MIPs were obtained to evaluate the vascular anatomy. Multidetector CT imaging of the abdomen and pelvis was performed using the standard protocol during bolus administration of intravenous contrast. CONTRAST:  100 cc IV Isovue 370 COMPARISON:  CT abdomen and pelvis from 12/26/2004 FINDINGS: CTA CHEST FINDINGS Cardiovascular: Satisfactory opacification of the pulmonary arteries to the segmental level. No evidence of pulmonary embolism. Normal heart size. Trace anterior pericardial effusion. No aortic aneurysm or dissection. Go bold right O all Mediastinum/Nodes: There are nonspecific mildly enlarged mediastinal lymph nodes, the largest appears to be left tracheobronchial at 15 mm short axis with 12 mm subcarinal lymph node also present. Smaller paratracheal lymph nodes are noted bilaterally as well as small prevascular and aortic pulmonary window lymph nodes. Findings could be reactive in etiology. No axillary or supraclavicular lymphadenopathy. Lungs/Pleura: Trace bilateral pleural effusions. Wall no pneumonic consolidation. Left upper lobe anterior subpleural wall atelectasis suspected. Bibasilar dependent atelectasis is noted at each lung base. Musculoskeletal: No chest wall abnormality. No acute or significant osseous findings. Review of the MIP images confirms the above findings. CT ABDOMEN and PELVIS FINDINGS Hepatobiliary: No focal liver abnormality is seen. No gallstones, gallbladder wall thickening, or biliary dilatation. Pancreas: Unremarkable. No pancreatic ductal dilatation or surrounding inflammatory changes. Spleen: Normal in size without focal abnormality. Adrenals/Urinary Tract: No enhancing mass lesion in either kidney. No obstructive uropathy. With adrenal glands. Nonspecific mild perinephric fat stranding. Mild circumferential wall thickening of the bladder possibly  related to cystitis. Stomach/Bowel: No bowel obstruction or acute inflammation. Nodular densities involving multiple colonic haustra likely represent adherent stool, less likely polyps. Vascular/Lymphatic: No significant vascular findings are present. No enlarged abdominal or pelvic lymph nodes. Reproductive: TURP defect of the prostate Other: No ascites Musculoskeletal: Transitional lumbosacral anatomy. There appears to be L5 spondylolysis without spondylolisthesis. Review of the MIP images confirms the above findings. IMPRESSION: No pulmonary embolus. Mild nonspecific mediastinal lymphadenopathy. Trace bilateral pleural effusions. No acute intra-abdominal pelvic abnormalities. TURP defect of the prostate. L5 spondylolysis. Electronically Signed   By: Ashley Royalty M.D.   On: 02/28/2016 01:05    Scheduled Meds: . azithromycin  500 mg Intravenous Q24H  . cefTRIAXone (ROCEPHIN)  IV  1 g Intravenous Q24H  . cholecalciferol  2,000 Units Oral Q lunch  . clopidogrel  75 mg Oral Daily  . enoxaparin (LOVENOX) injection  40 mg Subcutaneous Q24H  . gabapentin  800 mg Oral TID  . ondansetron (ZOFRAN) IV  4 mg Intravenous Once  . pantoprazole  40 mg Oral Daily    Continuous Infusions: . sodium chloride 125 mL/hr at 02/28/16 1453       Khriz Liddy MD, PhD  [facility] Pager [phone]. If 7PM-7AM, please contact night-coverage at [email or web address], password [email or web address] 02/28/2016, 3:41 PM  LOS: 0 days

## 2016-02-28 NOTE — Progress Notes (Signed)
CRITICAL VALUE ALERT  Critical value received:  Troponin 0.07  Date of notification:  02/28/2016  Time of notification:  0834  Critical value read back:Yes.    Nurse who received alert:  Ranae Pila RN  MD notified (1st page):  Erlinda Hong  Time of first page:  [phone]  MD notified (2nd page):  Time of second page:  Responding MD:  Erlinda Hong  Time MD responded:  [phone]

## 2016-02-28 NOTE — ED Notes (Signed)
Provider called wants a lactic acid plasma in 23 done asap

## 2016-02-28 NOTE — ED Notes (Signed)
Wife wants a call when pt has a room [phone]

## 2016-02-29 ENCOUNTER — Inpatient Hospital Stay (HOSPITAL_COMMUNITY): Payer: Medicare Other

## 2016-02-29 DIAGNOSIS — R0600 Dyspnea, unspecified: Secondary | ICD-10-CM

## 2016-02-29 LAB — BASIC METABOLIC PANEL
Anion gap: 5 (ref 5–15)
BUN: 12 mg/dL (ref 6–20)
CO2: 27 mmol/L (ref 22–32)
CREATININE: 0.75 mg/dL (ref 0.61–1.24)
Calcium: 8.2 mg/dL — ABNORMAL LOW (ref 8.9–10.3)
Chloride: 111 mmol/L (ref 101–111)
Glucose, Bld: 96 mg/dL (ref 65–99)
POTASSIUM: 3.6 mmol/L (ref 3.5–5.1)
SODIUM: 143 mmol/L (ref 135–145)

## 2016-02-29 LAB — MAGNESIUM: MAGNESIUM: 1.9 mg/dL (ref 1.7–2.4)

## 2016-02-29 LAB — ECHOCARDIOGRAM COMPLETE
Height: 69.5 in
WEIGHTICAEL: 3352 [oz_av]

## 2016-02-29 LAB — CBC
HCT: 37 % — ABNORMAL LOW (ref 39.0–52.0)
Hemoglobin: 12.3 g/dL — ABNORMAL LOW (ref 13.0–17.0)
MCH: 31.9 pg (ref 26.0–34.0)
MCHC: 33.2 g/dL (ref 30.0–36.0)
MCV: 96.1 fL (ref 78.0–100.0)
PLATELETS: 154 10*3/uL (ref 150–400)
RBC: 3.85 MIL/uL — ABNORMAL LOW (ref 4.22–5.81)
RDW: 13.5 % (ref 11.5–15.5)
WBC: 5.9 10*3/uL (ref 4.0–10.5)

## 2016-02-29 LAB — HEPATIC FUNCTION PANEL
ALBUMIN: 2.9 g/dL — AB (ref 3.5–5.0)
ALT: 28 U/L (ref 17–63)
AST: 38 U/L (ref 15–41)
Alkaline Phosphatase: 30 U/L — ABNORMAL LOW (ref 38–126)
Bilirubin, Direct: <0.1 mg/dL — ABNORMAL LOW (ref 0.1–0.5)
Total Bilirubin: 1.1 mg/dL (ref 0.3–1.2)
Total Protein: 5.8 g/dL — ABNORMAL LOW (ref 6.5–8.1)

## 2016-02-29 LAB — URINE CULTURE: Culture: NO GROWTH

## 2016-02-29 LAB — TROPONIN I: Troponin I: 0.04 ng/mL (ref <0.03)

## 2016-02-29 MED ORDER — SALINE SPRAY 0.65 % NA SOLN
1.0000 | Freq: Two times a day (BID) | NASAL | Status: DC
  Administered 2016-02-29: 1 via NASAL
  Filled 2016-02-29: qty 44

## 2016-02-29 MED ORDER — GUAIFENESIN ER 600 MG PO TB12
600.0000 mg | ORAL_TABLET | Freq: Two times a day (BID) | ORAL | Status: DC
  Administered 2016-02-29 – 2016-03-01 (×3): 600 mg via ORAL
  Filled 2016-02-29 (×3): qty 1

## 2016-02-29 NOTE — Progress Notes (Signed)
PROGRESS NOTE  David Li [id number] DOB: Dec 04, 1940 DOA: 02/27/2016 PCP: Lilian Coma, MD  HPI/Recap of past 24 hours:  Feeling better, now on room air, still feel weak, no appetite Wife in room  Assessment/Plan: Principal Problem:   Acute respiratory failure with hypoxia ([place]) Active Problems:   Hereditary and idiopathic peripheral neuropathy   Pneumonia   Nausea without vomiting   Acute respiratory failure ([place])  Acute respiratory failure with hypoxia, leukocytosis, lactic acidosis , possibly from undertreated uti, and patient likely came in with severe sepsis from under treated uti. CTA no PE, no pneumonia. Echo pending he actually responded to current treatment very quick, he looks much better today, leukocytosis /lactic acidosis resolved, will continue iv abx, ivf for today, culture sent on admission, not sure will yield anything, since he has already been on abx,  urine culture obtained from pmd's office, no significant growth.   N/V from undertreated uti? CT Ab/pel no acute findings, Seems has resolved, now tolerating diet. No diarrhea, no abdominal pain  Elevation of lft: ct ab liver and gallbladder unremarkable, lipase wnl. , hepatitis panel. He does drink one glass of wine daily,though lft elevation likely from sepsis from undertreated uti. abdominal US did reveal multiple gallstones, I have discussed with patient and wife who decided to follow up with GI Dr Cristina Gong for this.  QTc prolongation: QTc 558 , d/c zithromax,. Repeat ekg in am QTc 435  Neurogenic bladder vs chronic urinary retention from prostate issues, patient has been doing self cath daily for a  Year.   H/o neuropathy on neurontin, remote h/o vasculitis ( more than 4yrs ago) was on steroids for 2 years  On plavix for stroke prevention, no personal h/o stroke but does has family h/o stroke per patient.   Code Status: full  Family Communication: patient and wife at bedside  Disposition  Plan: home, likely 10/21   Consultants:  none  Procedures:  none  Antibiotics:  Rocephin from admission  zithro from admission to 10/19   Objective: BP (!) 141/76 (BP Location: Left Arm)   Pulse 69   Temp 97.8 F (36.6 C) (Oral)   Resp 16   Ht 5' 9.5" (1.765 m)   Wt 95 kg (209 lb 8 oz)   SpO2 98%   BMI 30.49 kg/m   Intake/Output Summary (Last 24 hours) at 02/29/16 1809 Last data filed at 02/29/16 1423  Gross per 24 hour  Intake             2270 ml  Output              500 ml  Net             1770 ml   Filed Weights   02/28/16 0445  Weight: 95 kg (209 lb 8 oz)    Exam:   General:  NAD  Cardiovascular: RRR  Respiratory: CTABL  Abdomen: Soft/ND/NT, positive BS  Musculoskeletal: No Edema  Neuro: aaox3  Data Reviewed: Basic Metabolic Panel:  Recent Labs Lab 02/27/16 2212 02/28/16 0721 02/29/16 0502  NA 133* 137 143  K 3.7 4.0 3.6  CL 100* 107 111  CO2 24 25 27   GLUCOSE 174* 110* 96  BUN 15 14 12   CREATININE 0.99 0.91 0.75  CALCIUM 8.3* 7.6* 8.2*  MG  --   --  1.9   Liver Function Tests:  Recent Labs Lab 02/27/16 2212 02/28/16 0721 02/29/16 0502  AST 50* 48* 38  ALT 27 25 28  ALKPHOS 43 36* 30*  BILITOT 2.5* 1.8* 1.1  PROT 7.2 6.1* 5.8*  ALBUMIN 3.6 3.0* 2.9*    Recent Labs Lab 02/27/16 2212  LIPASE 13   No results for input(s): AMMONIA in the last 168 hours. CBC:  Recent Labs Lab 02/27/16 2212 02/28/16 0721 02/29/16 0502  WBC 17.6* 10.0 5.9  NEUTROABS  --  8.7*  --   HGB 15.4 13.1 12.3*  HCT 43.6 39.2 37.0*  MCV 92.8 95.8 96.1  PLT 179 148* 154   Cardiac Enzymes:    Recent Labs Lab 02/28/16 0721 02/29/16 0502  TROPONINI 0.07* 0.04*   BNP (last 3 results)  Recent Labs  02/28/16 0721  BNP 57.9    ProBNP (last 3 results) No results for input(s): PROBNP in the last 8760 hours.  CBG: No results for input(s): GLUCAP in the last 168 hours.  Recent Results (from the past 240 hour(s))  Blood culture  (routine x 2)     Status: None (Preliminary result)   Collection Time: 02/27/16 11:59 PM  Result Value Ref Range Status   Specimen Description BLOOD LEFT ANTECUBITAL  Final   Special Requests IN PEDIATRIC BOTTLE  5CC AND ANA 5CC  Final   Culture   Final    NO GROWTH < 24 HOURS Performed at [facility]    Report Status PENDING  Incomplete  Urine culture     Status: None   Collection Time: 02/28/16 12:50 AM  Result Value Ref Range Status   Specimen Description URINE, RANDOM  Final   Special Requests NONE  Final   Culture   Final    NO GROWTH 1 DAY Performed at [facility]    Report Status 02/29/2016 FINAL  Final  Blood culture (routine x 2)     Status: None (Preliminary result)   Collection Time: 02/28/16  2:14 AM  Result Value Ref Range Status   Specimen Description BLOOD LEFT ANTECUBITAL  Final   Special Requests IN PEDIATRIC BOTTLE 5CC  Final   Culture   Final    NO GROWTH 1 DAY Performed at [facility]    Report Status PENDING  Incomplete     Studies: US Abdomen Limited Ruq  Result Date: 02/29/2016 CLINICAL DATA:  Elevated liver enzymes EXAM: US ABDOMEN LIMITED - RIGHT UPPER QUADRANT COMPARISON:  CT abdomen and pelvis February 27, 2016 FINDINGS: Gallbladder: Within the gallbladder, there are echogenic foci which move and shadow consistent with cholelithiasis. Largest individual gallstone measures 1.2 cm in length. There is no gallbladder wall thickening or pericholecystic fluid. No sonographic Murphy sign noted by sonographer. Common bile duct: Diameter: 4 mm. There is no intrahepatic or extrahepatic biliary duct dilatation. Liver: No focal lesion identified. Within normal limits in parenchymal echogenicity. IMPRESSION: Cholelithiasis.  Study otherwise unremarkable. Electronically Signed   By: Lowella Grip III M.D.   On: 02/29/2016 09:02    Scheduled Meds: . cefTRIAXone (ROCEPHIN)  IV  1 g Intravenous Q24H  . cholecalciferol  2,000 Units Oral Q  lunch  . clopidogrel  75 mg Oral Daily  . enoxaparin (LOVENOX) injection  40 mg Subcutaneous Q24H  . gabapentin  800 mg Oral TID  . guaiFENesin  600 mg Oral BID  . ondansetron (ZOFRAN) IV  4 mg Intravenous Once  . pantoprazole  40 mg Oral Daily  . sodium chloride  1 spray Each Nare BID    Continuous Infusions:    Time spent: 75mins   Inola Lisle MD, PhD  [facility] Pager [phone]. If 7PM-7AM, please contact night-coverage at [email or web address], password [facility] 02/29/2016, 6:09 PM  LOS: 1 day

## 2016-02-29 NOTE — Progress Notes (Signed)
  Echocardiogram 2D Echocardiogram has been performed.  Tresa Res 02/29/2016, 1:15 PM

## 2016-03-01 LAB — COMPREHENSIVE METABOLIC PANEL
ALBUMIN: 2.9 g/dL — AB (ref 3.5–5.0)
ALK PHOS: 32 U/L — AB (ref 38–126)
ALT: 26 U/L (ref 17–63)
ANION GAP: 9 (ref 5–15)
AST: 30 U/L (ref 15–41)
BUN: 9 mg/dL (ref 6–20)
CALCIUM: 8.2 mg/dL — AB (ref 8.9–10.3)
CHLORIDE: 110 mmol/L (ref 101–111)
CO2: 24 mmol/L (ref 22–32)
CREATININE: 0.75 mg/dL (ref 0.61–1.24)
GFR calc Af Amer: >60 mL/min (ref >60)
GFR calc non Af Amer: >60 mL/min (ref >60)
GLUCOSE: 88 mg/dL (ref 65–99)
Potassium: 4 mmol/L (ref 3.5–5.1)
SODIUM: 143 mmol/L (ref 135–145)
Total Bilirubin: 1.1 mg/dL (ref 0.3–1.2)
Total Protein: 5.5 g/dL — ABNORMAL LOW (ref 6.5–8.1)

## 2016-03-01 LAB — HEPATITIS PANEL, ACUTE
HCV Ab: 0.4 s/co ratio (ref 0.0–0.9)
HEP B C IGM: NEGATIVE
Hep A IgM: NEGATIVE
Hepatitis B Surface Ag: NEGATIVE

## 2016-03-01 LAB — MAGNESIUM: Magnesium: 1.9 mg/dL (ref 1.7–2.4)

## 2016-03-01 MED ORDER — FUROSEMIDE 10 MG/ML IJ SOLN
40.0000 mg | Freq: Once | INTRAMUSCULAR | Status: AC
  Administered 2016-03-01: 40 mg via INTRAVENOUS
  Filled 2016-03-01: qty 4

## 2016-03-01 MED ORDER — CEPHALEXIN 500 MG PO CAPS: 500.0000 mg | ORAL_CAPSULE | Freq: Three times a day (TID) | ORAL | 0 refills | Status: DC

## 2016-03-01 NOTE — Discharge Summary (Signed)
Discharge Summary  David Li [id number] DOB: 1940-07-23  PCP: Lilian Coma, MD  Admit date: 02/27/2016 Discharge date: 03/01/2016  Time spent: <79mins  Recommendations for Outpatient Follow-up:  1. F/u with PMD within a week  for hospital discharge follow up, repeat cbc/bmp at follow up 2. F/u with GI for gallstone 3. F/u with urology for recurrent uti  Discharge Diagnoses:  Active Hospital Problems   Diagnosis Date Noted  . Acute respiratory failure with hypoxia ([place]) 02/28/2016  . Pneumonia 02/28/2016  . Nausea without vomiting 02/28/2016  . Acute respiratory failure ([place]) 02/28/2016  . Hereditary and idiopathic peripheral neuropathy 03/01/2013    Resolved Hospital Problems   Diagnosis Date Noted Date Resolved  No resolved problems to display.    Discharge Condition: stable  Diet recommendation: heart healthy  Filed Weights   02/28/16 0445  Weight: 95 kg (209 lb 8 oz)    History of present illness:  Patient coming from: Home.  Chief Complaint: Nausea and shortness of breath.  HPI: David Li is a 75 y.o. male with history of neuropathy presents to the ER because of persistent nausea and shortness of breath. Patient's symptoms started yesterday morning. Patient also had 2 episodes of diarrhea. In the ER and CT of the chest and abdomen was done which shows bilateral pleural effusion otherwise unremarkable. Patient denies any chest pain productive cough abdominal pain. Had some subjective feeling of chills. In ER patient had blood work done which showed leukocytosis with elevated lactate around 4. Patient also was found to be easily desaturating for which patient was placed on oxygen. Patient was given 3 L fluid bolus started on empiric antibiotics for possible pneumonia.  ED Course: Patient was given 3 L fluid bolus in the ER. Started on empiric antibiotics for possible pneumonia. Had CT angiogram of the chest and abdomen and pelvis.  Hospital  Course:  Principal Problem:   Acute respiratory failure with hypoxia (HCC) Active Problems:   Hereditary and idiopathic peripheral neuropathy   Pneumonia   Nausea without vomiting   Acute respiratory failure (H[facility])   Acute respiratory failure with hypoxia, leukocytosis, lactic acidosis , possibly from undertreated uti, and patient likely came in with severe sepsis from undertreated uti. CTA no PE, no pneumonia. Echo lvef wnl, does has grade ii diastolic dysfunction he actually responded to current treatment very quick, he looks much better the next day after being admitted, leukocytosis /lactic acidosis resolved, he is continue on  iv abx, culture sent on admission no growth, urine culture obtained from pmd's office, no significant growth. He is discharged home with keflex for another 5 days.   N/V from undertreated uti? Vs gallstone, CT Ab/pel no acute findings, Seems has resolved, now tolerating diet. No diarrhea, no abdominal pain  Elevation of lft: ct ab liver and gallbladder unremarkable, lipase wnl. , hepatitis panel negative. He does drink one glass of wine daily,though lft elevation possibly from sepsis from undertreated uti or from gallstone. abdominal US did reveal multiple gallstones, I have discussed with patient and wife who decided to follow up with GI Dr Cristina Gong for this. lft normalized at discharge, he does not have n/v, no abdominal pain, he tolerate regular diet.  QTc prolongation: QTc 558 , d/c zithromax,. Repeat ekg in am QTc 435  Neurogenic bladder vs chronic urinary retention from prostate issues, patient has been doing self cath daily for a  Year.   H/o neuropathy on neurontin, remote h/o vasculitis ( more than 76yrs ago) was  on steroids for 2 years  On plavix for stroke prevention, no personal h/o stroke but does has family h/o stroke per patient.  diastolic chf, new diagnosis as evidence by echocardiogram, patient does report intermittent lower extremity  edema for the last few years.  He does has some cough which I think he has some mild fluids overload, he is given one dose of lasix prior to discharge. I have discussed with patient about prn lasix at home for edema, he prefer to use compression stocking for now. He is largely euvolemic at time of discharge, no edema, no hypoxia, lung exam unremarkable.  Code Status: full  Family Communication: patient   Disposition Plan: home on 10/21   Consultants:  none  Procedures:  none  Antibiotics:  Rocephin from admission  zithro from admission to 10/19   Discharge Exam: BP (!) 155/83 (BP Location: Left Arm)   Pulse 61   Temp 98 F (36.7 C) (Oral)   Resp 18   Ht 5' 9.5" (1.765 m)   Wt 95 kg (209 lb 8 oz)   SpO2 97%   BMI 30.49 kg/m     General:  NAD  Cardiovascular: RRR  Respiratory: CTABL  Abdomen: Soft/ND/NT, positive BS  Musculoskeletal: No Edema  Neuro: aaox3   Discharge Instructions You were cared for by a hospitalist during your hospital stay. If you have any questions about your discharge medications or the care you received while you were in the hospital after you are discharged, you can call the unit and asked to speak with the hospitalist on call if the hospitalist that took care of you is not available. Once you are discharged, your primary care physician will handle any further medical issues. Please note that NO REFILLS for any discharge medications will be authorized once you are discharged, as it is imperative that you return to your primary care physician (or establish a relationship with a primary care physician if you do not have one) for your aftercare needs so that they can reassess your need for medications and monitor your lab values.  Discharge Instructions    Diet - low sodium heart healthy    Complete by:  As directed    Low fat diet   Increase activity slowly    Complete by:  As directed        Medication List    TAKE these  medications   ACYCLOVIR PO Take 400 mg by mouth as needed (breakout).   cephALEXin 500 MG capsule Commonly known as:  KEFLEX Take 1 capsule (500 mg total) by mouth 3 (three) times daily.   cholecalciferol 1000 units tablet Commonly known as:  VITAMIN D Take 2,000 Units by mouth daily with lunch.   clopidogrel 75 MG tablet Commonly known as:  PLAVIX Take 1 tablet (75 mg total) by mouth daily.   gabapentin 800 MG tablet Commonly known as:  NEURONTIN Take 1 tablet (800 mg total) by mouth 3 (three) times daily.   omeprazole 20 MG capsule Commonly known as:  PRILOSEC Take 20 mg by mouth daily.   trimethoprim 100 MG tablet Commonly known as:  TRIMPEX Take 1 tablet by mouth daily.   zolpidem 10 MG tablet Commonly known as:  AMBIEN Take 2.5 mg by mouth at bedtime as needed for sleep.      Allergies  Allergen Reactions  . Other     Muscle relaxant medication   Follow-up Information    WOLTERS,SHARON A, MD Follow up in 1 week(s).  Specialty:  Family Medicine Why:  hospital discharge follow up, repeat cbc/bmp at follow up Contact information: [place] [address] [phone]        BUCCINI,ROBERT V, MD Follow up in 3 week(s).   Specialty:  Gastroenterology Why:  gallstones Contact information: [address] [place] [address] [phone]            The results of significant diagnostics from this hospitalization (including imaging, microbiology, ancillary and laboratory) are listed below for reference.    Significant Diagnostic Studies: Dg Chest 2 View  Result Date: 02/27/2016 CLINICAL DATA:  Acute onset of generalized weakness, shortness of breath, nausea, vomiting and diarrhea. Initial encounter. EXAM: CHEST  2 VIEW COMPARISON:  Chest radiograph performed 08/13/2009 FINDINGS: The lungs are well-aerated. Vascular congestion is noted. Mild peribronchial thickening is seen. There is no evidence of  pleural effusion or pneumothorax. The heart is normal in size; the mediastinal contour is within normal limits. No acute osseous abnormalities are seen. IMPRESSION: Vascular congestion noted.  Mild peribronchial thickening seen. Electronically Signed   By: Garald Balding M.D.   On: 02/27/2016 23:20   Ct Angio Chest Pe W And/or Wo Contrast  Result Date: 02/28/2016 CLINICAL DATA:  Nausea, vomiting and diarrhea since early this morning. Weakness and dyspnea. History of perforated gastric ulcer. EXAM: CT ANGIOGRAPHY CHEST CT ABDOMEN AND PELVIS WITH CONTRAST TECHNIQUE: Multidetector CT imaging of the chest was performed using the standard protocol during bolus administration of intravenous contrast. Multiplanar CT image reconstructions and MIPs were obtained to evaluate the vascular anatomy. Multidetector CT imaging of the abdomen and pelvis was performed using the standard protocol during bolus administration of intravenous contrast. CONTRAST:  100 cc IV Isovue 370 COMPARISON:  CT abdomen and pelvis from 12/26/2004 FINDINGS: CTA CHEST FINDINGS Cardiovascular: Satisfactory opacification of the pulmonary arteries to the segmental level. No evidence of pulmonary embolism. Normal heart size. Trace anterior pericardial effusion. No aortic aneurysm or dissection. Go bold right O all Mediastinum/Nodes: There are nonspecific mildly enlarged mediastinal lymph nodes, the largest appears to be left tracheobronchial at 15 mm short axis with 12 mm subcarinal lymph node also present. Smaller paratracheal lymph nodes are noted bilaterally as well as small prevascular and aortic pulmonary window lymph nodes. Findings could be reactive in etiology. No axillary or supraclavicular lymphadenopathy. Lungs/Pleura: Trace bilateral pleural effusions. Wall no pneumonic consolidation. Left upper lobe anterior subpleural wall atelectasis suspected. Bibasilar dependent atelectasis is noted at each lung base. Musculoskeletal: No chest wall  abnormality. No acute or significant osseous findings. Review of the MIP images confirms the above findings. CT ABDOMEN and PELVIS FINDINGS Hepatobiliary: No focal liver abnormality is seen. No gallstones, gallbladder wall thickening, or biliary dilatation. Pancreas: Unremarkable. No pancreatic ductal dilatation or surrounding inflammatory changes. Spleen: Normal in size without focal abnormality. Adrenals/Urinary Tract: No enhancing mass lesion in either kidney. No obstructive uropathy. With adrenal glands. Nonspecific mild perinephric fat stranding. Mild circumferential wall thickening of the bladder possibly related to cystitis. Stomach/Bowel: No bowel obstruction or acute inflammation. Nodular densities involving multiple colonic haustra likely represent adherent stool, less likely polyps. Vascular/Lymphatic: No significant vascular findings are present. No enlarged abdominal or pelvic lymph nodes. Reproductive: TURP defect of the prostate Other: No ascites Musculoskeletal: Transitional lumbosacral anatomy. There appears to be L5 spondylolysis without spondylolisthesis. Review of the MIP images confirms the above findings. IMPRESSION: No pulmonary embolus. Mild nonspecific mediastinal lymphadenopathy. Trace bilateral pleural effusions. No acute intra-abdominal pelvic  abnormalities. TURP defect of the prostate. L5 spondylolysis. Electronically Signed   By: Ashley Royalty M.D.   On: 02/28/2016 01:05   Ct Abdomen Pelvis W Contrast  Result Date: 02/28/2016 CLINICAL DATA:  Nausea, vomiting and diarrhea since early this morning. Weakness and dyspnea. History of perforated gastric ulcer. EXAM: CT ANGIOGRAPHY CHEST CT ABDOMEN AND PELVIS WITH CONTRAST TECHNIQUE: Multidetector CT imaging of the chest was performed using the standard protocol during bolus administration of intravenous contrast. Multiplanar CT image reconstructions and MIPs were obtained to evaluate the vascular anatomy. Multidetector CT imaging of the  abdomen and pelvis was performed using the standard protocol during bolus administration of intravenous contrast. CONTRAST:  100 cc IV Isovue 370 COMPARISON:  CT abdomen and pelvis from 12/26/2004 FINDINGS: CTA CHEST FINDINGS Cardiovascular: Satisfactory opacification of the pulmonary arteries to the segmental level. No evidence of pulmonary embolism. Normal heart size. Trace anterior pericardial effusion. No aortic aneurysm or dissection. Go bold right O all Mediastinum/Nodes: There are nonspecific mildly enlarged mediastinal lymph nodes, the largest appears to be left tracheobronchial at 15 mm short axis with 12 mm subcarinal lymph node also present. Smaller paratracheal lymph nodes are noted bilaterally as well as small prevascular and aortic pulmonary window lymph nodes. Findings could be reactive in etiology. No axillary or supraclavicular lymphadenopathy. Lungs/Pleura: Trace bilateral pleural effusions. Wall no pneumonic consolidation. Left upper lobe anterior subpleural wall atelectasis suspected. Bibasilar dependent atelectasis is noted at each lung base. Musculoskeletal: No chest wall abnormality. No acute or significant osseous findings. Review of the MIP images confirms the above findings. CT ABDOMEN and PELVIS FINDINGS Hepatobiliary: No focal liver abnormality is seen. No gallstones, gallbladder wall thickening, or biliary dilatation. Pancreas: Unremarkable. No pancreatic ductal dilatation or surrounding inflammatory changes. Spleen: Normal in size without focal abnormality. Adrenals/Urinary Tract: No enhancing mass lesion in either kidney. No obstructive uropathy. With adrenal glands. Nonspecific mild perinephric fat stranding. Mild circumferential wall thickening of the bladder possibly related to cystitis. Stomach/Bowel: No bowel obstruction or acute inflammation. Nodular densities involving multiple colonic haustra likely represent adherent stool, less likely polyps. Vascular/Lymphatic: No  significant vascular findings are present. No enlarged abdominal or pelvic lymph nodes. Reproductive: TURP defect of the prostate Other: No ascites Musculoskeletal: Transitional lumbosacral anatomy. There appears to be L5 spondylolysis without spondylolisthesis. Review of the MIP images confirms the above findings. IMPRESSION: No pulmonary embolus. Mild nonspecific mediastinal lymphadenopathy. Trace bilateral pleural effusions. No acute intra-abdominal pelvic abnormalities. TURP defect of the prostate. L5 spondylolysis. Electronically Signed   By: Ashley Royalty M.D.   On: 02/28/2016 01:05   US Abdomen Limited Ruq  Result Date: 02/29/2016 CLINICAL DATA:  Elevated liver enzymes EXAM: US ABDOMEN LIMITED - RIGHT UPPER QUADRANT COMPARISON:  CT abdomen and pelvis February 27, 2016 FINDINGS: Gallbladder: Within the gallbladder, there are echogenic foci which move and shadow consistent with cholelithiasis. Largest individual gallstone measures 1.2 cm in length. There is no gallbladder wall thickening or pericholecystic fluid. No sonographic Murphy sign noted by sonographer. Common bile duct: Diameter: 4 mm. There is no intrahepatic or extrahepatic biliary duct dilatation. Liver: No focal lesion identified. Within normal limits in parenchymal echogenicity. IMPRESSION: Cholelithiasis.  Study otherwise unremarkable. Electronically Signed   By: Lowella Grip III M.D.   On: 02/29/2016 09:02    Microbiology: Recent Results (from the past 240 hour(s))  Blood culture (routine x 2)     Status: None (Preliminary result)   Collection Time: 02/27/16 11:59 PM  Result Value Ref Range Status  Specimen Description BLOOD LEFT ANTECUBITAL  Final   Special Requests IN PEDIATRIC BOTTLE  5CC AND ANA 5CC  Final   Culture   Final    NO GROWTH < 24 HOURS Performed at [facility]    Report Status PENDING  Incomplete  Urine culture     Status: None   Collection Time: 02/28/16 12:50 AM  Result Value Ref Range Status    Specimen Description URINE, RANDOM  Final   Special Requests NONE  Final   Culture   Final    NO GROWTH 1 DAY Performed at [facility]    Report Status 02/29/2016 FINAL  Final  Blood culture (routine x 2)     Status: None (Preliminary result)   Collection Time: 02/28/16  2:14 AM  Result Value Ref Range Status   Specimen Description BLOOD LEFT ANTECUBITAL  Final   Special Requests IN PEDIATRIC BOTTLE 5CC  Final   Culture   Final    NO GROWTH 1 DAY Performed at [facility]    Report Status PENDING  Incomplete     Labs: Basic Metabolic Panel:  Recent Labs Lab 02/27/16 2212 02/28/16 0721 02/29/16 0502 03/01/16 0536  NA 133* 137 143 143  K 3.7 4.0 3.6 4.0  CL 100* 107 111 110  CO2 24 25 27 24   GLUCOSE 174* 110* 96 88  BUN 15 14 12 9   CREATININE 0.99 0.91 0.75 0.75  CALCIUM 8.3* 7.6* 8.2* 8.2*  MG  --   --  1.9 1.9   Liver Function Tests:  Recent Labs Lab 02/27/16 2212 02/28/16 0721 02/29/16 0502 03/01/16 0536  AST 50* 48* 38 30  ALT 27 25 28 26   ALKPHOS 43 36* 30* 32*  BILITOT 2.5* 1.8* 1.1 1.1  PROT 7.2 6.1* 5.8* 5.5*  ALBUMIN 3.6 3.0* 2.9* 2.9*    Recent Labs Lab 02/27/16 2212  LIPASE 13   No results for input(s): AMMONIA in the last 168 hours. CBC:  Recent Labs Lab 02/27/16 2212 02/28/16 0721 02/29/16 0502  WBC 17.6* 10.0 5.9  NEUTROABS  --  8.7*  --   HGB 15.4 13.1 12.3*  HCT 43.6 39.2 37.0*  MCV 92.8 95.8 96.1  PLT 179 148* 154   Cardiac Enzymes:  Recent Labs Lab 02/28/16 0721 02/29/16 0502  TROPONINI 0.07* 0.04*   BNP: BNP (last 3 results)  Recent Labs  02/28/16 0721  BNP 57.9    ProBNP (last 3 results) No results for input(s): PROBNP in the last 8760 hours.  CBG: No results for input(s): GLUCAP in the last 168 hours.     SignedFlorencia Reasons MD, PhD  [facility] 03/01/2016, 10:13 AM

## 2016-03-02 ENCOUNTER — Encounter (HOSPITAL_COMMUNITY): Payer: Self-pay | Admitting: Emergency Medicine

## 2016-03-02 ENCOUNTER — Inpatient Hospital Stay (HOSPITAL_COMMUNITY)
Admission: EM | Admit: 2016-03-02 | Discharge: 2016-03-05 | DRG: 871 | Disposition: A | Discharge status: Home / Self Care | Payer: Medicare Other | Attending: Family Medicine | Admitting: Family Medicine

## 2016-03-02 ENCOUNTER — Emergency Department (HOSPITAL_COMMUNITY): Payer: Medicare Other

## 2016-03-02 DIAGNOSIS — E876 Hypokalemia: Secondary | ICD-10-CM

## 2016-03-02 DIAGNOSIS — J9601 Acute respiratory failure with hypoxia: Secondary | ICD-10-CM | POA: Diagnosis present

## 2016-03-02 DIAGNOSIS — Z7902 Long term (current) use of antithrombotics/antiplatelets: Secondary | ICD-10-CM

## 2016-03-02 DIAGNOSIS — N319 Neuromuscular dysfunction of bladder, unspecified: Secondary | ICD-10-CM | POA: Diagnosis present

## 2016-03-02 DIAGNOSIS — Z833 Family history of diabetes mellitus: Secondary | ICD-10-CM

## 2016-03-02 DIAGNOSIS — Z8673 Personal history of transient ischemic attack (TIA), and cerebral infarction without residual deficits: Secondary | ICD-10-CM

## 2016-03-02 DIAGNOSIS — R197 Diarrhea, unspecified: Secondary | ICD-10-CM | POA: Diagnosis present

## 2016-03-02 DIAGNOSIS — Z87440 Personal history of urinary (tract) infections: Secondary | ICD-10-CM | POA: Diagnosis not present

## 2016-03-02 DIAGNOSIS — G609 Hereditary and idiopathic neuropathy, unspecified: Secondary | ICD-10-CM | POA: Diagnosis present

## 2016-03-02 DIAGNOSIS — R9431 Abnormal electrocardiogram [ECG] [EKG]: Secondary | ICD-10-CM | POA: Diagnosis not present

## 2016-03-02 DIAGNOSIS — N320 Bladder-neck obstruction: Secondary | ICD-10-CM | POA: Diagnosis present

## 2016-03-02 DIAGNOSIS — K8020 Calculus of gallbladder without cholecystitis without obstruction: Secondary | ICD-10-CM

## 2016-03-02 DIAGNOSIS — A419 Sepsis, unspecified organism: Secondary | ICD-10-CM

## 2016-03-02 DIAGNOSIS — Z87891 Personal history of nicotine dependence: Secondary | ICD-10-CM

## 2016-03-02 DIAGNOSIS — D649 Anemia, unspecified: Secondary | ICD-10-CM | POA: Diagnosis present

## 2016-03-02 DIAGNOSIS — R0902 Hypoxemia: Secondary | ICD-10-CM | POA: Diagnosis present

## 2016-03-02 DIAGNOSIS — I5030 Unspecified diastolic (congestive) heart failure: Secondary | ICD-10-CM | POA: Diagnosis present

## 2016-03-02 DIAGNOSIS — Z8711 Personal history of peptic ulcer disease: Secondary | ICD-10-CM | POA: Diagnosis not present

## 2016-03-02 DIAGNOSIS — Z9079 Acquired absence of other genital organ(s): Secondary | ICD-10-CM | POA: Diagnosis not present

## 2016-03-02 DIAGNOSIS — Z823 Family history of stroke: Secondary | ICD-10-CM

## 2016-03-02 DIAGNOSIS — R110 Nausea: Secondary | ICD-10-CM | POA: Diagnosis present

## 2016-03-02 DIAGNOSIS — K8080 Other cholelithiasis without obstruction: Secondary | ICD-10-CM | POA: Diagnosis not present

## 2016-03-02 LAB — COMPREHENSIVE METABOLIC PANEL
ALT: 29 U/L (ref 17–63)
AST: 35 U/L (ref 15–41)
Albumin: 3.6 g/dL (ref 3.5–5.0)
Alkaline Phosphatase: 35 U/L — ABNORMAL LOW (ref 38–126)
Anion gap: 10 (ref 5–15)
BUN: 13 mg/dL (ref 6–20)
CHLORIDE: 102 mmol/L (ref 101–111)
CO2: 25 mmol/L (ref 22–32)
Calcium: 8.7 mg/dL — ABNORMAL LOW (ref 8.9–10.3)
Creatinine, Ser: 1.09 mg/dL (ref 0.61–1.24)
GFR calc Af Amer: >60 mL/min (ref >60)
Glucose, Bld: 136 mg/dL — ABNORMAL HIGH (ref 65–99)
POTASSIUM: 2.8 mmol/L — AB (ref 3.5–5.1)
Sodium: 137 mmol/L (ref 135–145)
Total Bilirubin: 2.2 mg/dL — ABNORMAL HIGH (ref 0.3–1.2)
Total Protein: 6.7 g/dL (ref 6.5–8.1)

## 2016-03-02 LAB — CBC WITH DIFFERENTIAL/PLATELET
BASOS ABS: 0 10*3/uL (ref 0.0–0.1)
BASOS PCT: 0 %
EOS PCT: 1 %
Eosinophils Absolute: 0.2 10*3/uL (ref 0.0–0.7)
HCT: 42.3 % (ref 39.0–52.0)
Hemoglobin: 14.3 g/dL (ref 13.0–17.0)
LYMPHS PCT: 2 %
Lymphs Abs: 0.3 10*3/uL — ABNORMAL LOW (ref 0.7–4.0)
MCH: 32.1 pg (ref 26.0–34.0)
MCHC: 33.8 g/dL (ref 30.0–36.0)
MCV: 94.8 fL (ref 78.0–100.0)
MONO ABS: 0.5 10*3/uL (ref 0.1–1.0)
Monocytes Relative: 3 %
Neutro Abs: 12.9 10*3/uL — ABNORMAL HIGH (ref 1.7–7.7)
Neutrophils Relative %: 93 %
PLATELETS: 186 10*3/uL (ref 150–400)
RBC: 4.46 MIL/uL (ref 4.22–5.81)
RDW: 13 % (ref 11.5–15.5)
WBC: 13.8 10*3/uL — ABNORMAL HIGH (ref 4.0–10.5)

## 2016-03-02 LAB — URINALYSIS, ROUTINE W REFLEX MICROSCOPIC
BILIRUBIN URINE: NEGATIVE
GLUCOSE, UA: NEGATIVE mg/dL
HGB URINE DIPSTICK: NEGATIVE
KETONES UR: NEGATIVE mg/dL
Leukocytes, UA: NEGATIVE
NITRITE: NEGATIVE
PH: 6 (ref 5.0–8.0)
Protein, ur: NEGATIVE mg/dL
Specific Gravity, Urine: 1.009 (ref 1.005–1.030)

## 2016-03-02 LAB — PROTIME-INR
INR: 1.03
PROTHROMBIN TIME: 13.5 s (ref 11.4–15.2)

## 2016-03-02 LAB — MRSA PCR SCREENING: MRSA BY PCR: NEGATIVE

## 2016-03-02 LAB — PROCALCITONIN: PROCALCITONIN: 17.51 ng/mL

## 2016-03-02 LAB — BLOOD GAS, ARTERIAL
ACID-BASE EXCESS: 1.7 mmol/L (ref 0.0–2.0)
BICARBONATE: 26.5 mmol/L (ref 20.0–28.0)
FIO2: 1
O2 Saturation: 97.5 %
PCO2 ART: 50.1 mmHg — AB (ref 32.0–48.0)
PH ART: 7.357 (ref 7.350–7.450)
PO2 ART: 119 mmHg — AB (ref 83.0–108.0)
Patient temperature: 103

## 2016-03-02 LAB — APTT: APTT: 26 s (ref 24–36)

## 2016-03-02 LAB — BASIC METABOLIC PANEL
ANION GAP: 6 (ref 5–15)
BUN: 15 mg/dL (ref 6–20)
CALCIUM: 7.6 mg/dL — AB (ref 8.9–10.3)
CHLORIDE: 108 mmol/L (ref 101–111)
CO2: 24 mmol/L (ref 22–32)
Creatinine, Ser: 0.95 mg/dL (ref 0.61–1.24)
GFR calc non Af Amer: >60 mL/min (ref >60)
GLUCOSE: 110 mg/dL — AB (ref 65–99)
POTASSIUM: 3.8 mmol/L (ref 3.5–5.1)
Sodium: 138 mmol/L (ref 135–145)

## 2016-03-02 LAB — I-STAT CG4 LACTIC ACID, ED
LACTIC ACID, VENOUS: 4.11 mmol/L — AB (ref 0.5–1.9)
LACTIC ACID, VENOUS: 2.55 mmol/L — AB (ref 0.5–1.9)

## 2016-03-02 LAB — INFLUENZA PANEL BY PCR (TYPE A & B)
H1N1 flu by pcr: NOT DETECTED
Influenza A By PCR: NEGATIVE
Influenza B By PCR: NEGATIVE

## 2016-03-02 LAB — MAGNESIUM
Magnesium: 1.4 mg/dL — ABNORMAL LOW (ref 1.7–2.4)
Magnesium: 2.1 mg/dL (ref 1.7–2.4)

## 2016-03-02 LAB — LACTIC ACID, PLASMA
LACTIC ACID, VENOUS: 2.1 mmol/L — AB (ref 0.5–1.9)
Lactic Acid, Venous: 2.2 mmol/L (ref 0.5–1.9)

## 2016-03-02 LAB — BRAIN NATRIURETIC PEPTIDE: B NATRIURETIC PEPTIDE 5: 26.5 pg/mL (ref 0.0–100.0)

## 2016-03-02 LAB — I-STAT TROPONIN, ED: Troponin i, poc: 0.01 ng/mL (ref 0.00–0.08)

## 2016-03-02 LAB — LIPASE, BLOOD: LIPASE: 19 U/L (ref 11–51)

## 2016-03-02 MED ORDER — SODIUM CHLORIDE 0.9 % IV BOLUS (SEPSIS)
1000.0000 mL | Freq: Once | INTRAVENOUS | Status: AC
  Administered 2016-03-02: 1000 mL via INTRAVENOUS

## 2016-03-02 MED ORDER — ACETAMINOPHEN 650 MG RE SUPP: 650.0000 mg | Freq: Four times a day (QID) | RECTAL | Status: DC | PRN

## 2016-03-02 MED ORDER — ONDANSETRON HCL 4 MG PO TABS: 4.0000 mg | ORAL_TABLET | Freq: Four times a day (QID) | ORAL | Status: DC | PRN

## 2016-03-02 MED ORDER — ZOLPIDEM TARTRATE 5 MG PO TABS: 2.5000 mg | ORAL_TABLET | Freq: Every evening | ORAL | Status: DC | PRN

## 2016-03-02 MED ORDER — VANCOMYCIN HCL IN DEXTROSE 750-5 MG/150ML-% IV SOLN
750.0000 mg | Freq: Two times a day (BID) | INTRAVENOUS | Status: DC
  Administered 2016-03-03: 750 mg via INTRAVENOUS
  Filled 2016-03-02 (×2): qty 150

## 2016-03-02 MED ORDER — LORAZEPAM 2 MG/ML IJ SOLN
1.0000 mg | Freq: Once | INTRAMUSCULAR | Status: AC
  Administered 2016-03-02: 1 mg via INTRAVENOUS
  Filled 2016-03-02: qty 1

## 2016-03-02 MED ORDER — ENOXAPARIN SODIUM 40 MG/0.4ML ~~LOC~~ SOLN
40.0000 mg | SUBCUTANEOUS | Status: DC
  Administered 2016-03-02 – 2016-03-04 (×3): 40 mg via SUBCUTANEOUS
  Filled 2016-03-02 (×3): qty 0.4

## 2016-03-02 MED ORDER — ACETAMINOPHEN 325 MG PO TABS
650.0000 mg | ORAL_TABLET | Freq: Four times a day (QID) | ORAL | Status: DC | PRN
Start: 2016-03-02 — End: 2016-03-05
  Administered 2016-03-02: 650 mg via ORAL
  Filled 2016-03-02: qty 2

## 2016-03-02 MED ORDER — SODIUM CHLORIDE 0.9 % IV SOLN
INTRAVENOUS | Status: AC
  Administered 2016-03-02: 16:00:00 via INTRAVENOUS

## 2016-03-02 MED ORDER — SENNOSIDES-DOCUSATE SODIUM 8.6-50 MG PO TABS: 1.0000 | ORAL_TABLET | Freq: Every evening | ORAL | Status: DC | PRN

## 2016-03-02 MED ORDER — VANCOMYCIN HCL 10 G IV SOLR
1500.0000 mg | INTRAVENOUS | Status: AC
  Administered 2016-03-02: 1500 mg via INTRAVENOUS
  Filled 2016-03-02: qty 1500

## 2016-03-02 MED ORDER — PROMETHAZINE HCL 25 MG/ML IJ SOLN
25.0000 mg | Freq: Once | INTRAMUSCULAR | Status: AC
  Administered 2016-03-02: 25 mg via INTRAVENOUS
  Filled 2016-03-02 (×2): qty 1

## 2016-03-02 MED ORDER — PIPERACILLIN-TAZOBACTAM 3.375 G IVPB
3.3750 g | Freq: Three times a day (TID) | INTRAVENOUS | Status: DC
  Administered 2016-03-02 – 2016-03-05 (×8): 3.375 g via INTRAVENOUS
  Filled 2016-03-02 (×8): qty 50

## 2016-03-02 MED ORDER — ONDANSETRON HCL 4 MG/2ML IJ SOLN
4.0000 mg | Freq: Four times a day (QID) | INTRAMUSCULAR | Status: DC | PRN
Start: 2016-03-02 — End: 2016-03-02

## 2016-03-02 MED ORDER — PANTOPRAZOLE SODIUM 40 MG PO TBEC
40.0000 mg | DELAYED_RELEASE_TABLET | Freq: Every day | ORAL | Status: DC
  Administered 2016-03-02 – 2016-03-04 (×2): 40 mg via ORAL
  Filled 2016-03-02 (×2): qty 1

## 2016-03-02 MED ORDER — FUROSEMIDE 10 MG/ML IJ SOLN
60.0000 mg | Freq: Once | INTRAMUSCULAR | Status: AC
  Administered 2016-03-02: 60 mg via INTRAVENOUS
  Filled 2016-03-02: qty 8

## 2016-03-02 MED ORDER — ONDANSETRON HCL 4 MG PO TABS
4.0000 mg | ORAL_TABLET | Freq: Three times a day (TID) | ORAL | Status: DC | PRN
  Administered 2016-03-04: 4 mg via ORAL
  Filled 2016-03-02: qty 1

## 2016-03-02 MED ORDER — SODIUM CHLORIDE 0.9 % IV SOLN
INTRAVENOUS | Status: AC
  Administered 2016-03-02: 13:00:00 via INTRAVENOUS

## 2016-03-02 MED ORDER — MAGNESIUM SULFATE 2 GM/50ML IV SOLN
2.0000 g | Freq: Once | INTRAVENOUS | Status: AC
  Administered 2016-03-02: 2 g via INTRAVENOUS
  Filled 2016-03-02: qty 50

## 2016-03-02 MED ORDER — ONDANSETRON HCL 4 MG/2ML IJ SOLN
4.0000 mg | Freq: Four times a day (QID) | INTRAMUSCULAR | Status: DC | PRN
  Administered 2016-03-04: 4 mg via INTRAVENOUS
  Filled 2016-03-02: qty 2

## 2016-03-02 MED ORDER — PIPERACILLIN-TAZOBACTAM 3.375 G IVPB 30 MIN
3.3750 g | INTRAVENOUS | Status: AC
  Administered 2016-03-02: 3.375 g via INTRAVENOUS
  Filled 2016-03-02: qty 50

## 2016-03-02 MED ORDER — VITAMIN D3 25 MCG (1000 UNIT) PO TABS
2000.0000 [IU] | ORAL_TABLET | Freq: Every day | ORAL | Status: DC
  Administered 2016-03-02 – 2016-03-04 (×3): 2000 [IU] via ORAL
  Filled 2016-03-02 (×3): qty 2

## 2016-03-02 MED ORDER — GABAPENTIN 400 MG PO CAPS
800.0000 mg | ORAL_CAPSULE | Freq: Three times a day (TID) | ORAL | Status: DC
  Administered 2016-03-02 – 2016-03-04 (×7): 800 mg via ORAL
  Filled 2016-03-02 (×8): qty 2

## 2016-03-02 MED ORDER — CLOPIDOGREL BISULFATE 75 MG PO TABS
75.0000 mg | ORAL_TABLET | Freq: Every day | ORAL | Status: DC
  Administered 2016-03-02: 75 mg via ORAL
  Filled 2016-03-02: qty 1

## 2016-03-02 MED ORDER — POTASSIUM CHLORIDE 10 MEQ/100ML IV SOLN
10.0000 meq | INTRAVENOUS | Status: AC
  Administered 2016-03-02 (×5): 10 meq via INTRAVENOUS
  Filled 2016-03-02 (×5): qty 100

## 2016-03-02 MED ORDER — DEXTROSE 5 % IV SOLN
100.0000 mg | Freq: Two times a day (BID) | INTRAVENOUS | Status: DC
  Administered 2016-03-02 – 2016-03-04 (×4): 100 mg via INTRAVENOUS
  Filled 2016-03-02 (×5): qty 100

## 2016-03-02 MED ORDER — POTASSIUM CHLORIDE 10 MEQ/100ML IV SOLN
10.0000 meq | Freq: Once | INTRAVENOUS | Status: AC
Start: 2016-03-02 — End: 2016-03-02
  Administered 2016-03-02: 10 meq via INTRAVENOUS
  Filled 2016-03-02: qty 100

## 2016-03-02 MED ORDER — ONDANSETRON HCL 4 MG/2ML IJ SOLN: 4.0000 mg | Freq: Once | INTRAMUSCULAR | Status: DC

## 2016-03-02 MED ORDER — POTASSIUM CHLORIDE 10 MEQ/100ML IV SOLN
10.0000 meq | INTRAVENOUS | Status: DC
Start: 2016-03-02 — End: 2016-03-02

## 2016-03-02 MED ORDER — MAGNESIUM SULFATE 50 % IJ SOLN: 1.0000 g | Freq: Once | INTRAMUSCULAR | Status: DC

## 2016-03-02 MED ORDER — MAGNESIUM SULFATE IN D5W 1-5 GM/100ML-% IV SOLN
1.0000 g | Freq: Once | INTRAVENOUS | Status: AC
  Administered 2016-03-02: 1 g via INTRAVENOUS
  Filled 2016-03-02: qty 100

## 2016-03-02 NOTE — Consult Note (Signed)
Reason for Consult: [place]  Referring Physician: Evangelos Paulino is an 75 y.o. male.   History of present illness: I was asked by Dr. Wynelle Cleveland to evaluate this patient for possible cholecystitis. He was actually just discharged yesterday after a several-day admission when he presented with nausea and vomiting, diarrhea and weakness. He has low oxygen sats and was treated for possible early sepsis, possibly secondary to pneumonia or undertreated urinary tract infection although it does not appear he had a definite source identified. The patient reports an today with similar symptoms of nausea and dry heaves with weakness. He was found to be somewhat hypoxic and hypotensive and was admitted to the ICU with an impression of early sepsis. Patient is currently stable. He is somewhat drowsy but responsive. He states he has nausea localized in the right side of his abdomen which I could really not get him to clarify. He actually denies pain. He does have a history of perforated gastric ulcer remotely in the 1980s. Denies fever or chills or jaundice. He however had fever to 103 after presentation to the hospital. Has had some diarrhea. He does self catheterize following TURP and has had previous UTIs.  Past Medical History:  Diagnosis Date  . BPH (benign prostatic hyperplasia)    Catherize 1-2 times daily.  . Neuromuscular disorder ([place])   . Perforated ulcer ([place])   . Stroke [facility])    pt states the Plavix is preventative due to the family history of stroke    Past Surgical History:  Procedure Laterality Date  . HERNIA REPAIR    . TONSILLECTOMY    . TRANSURETHRAL RESECTION OF PROSTATE      Family History  Problem Relation Age of Onset  . Stroke Mother   . Diabetes Mother   . Stroke Father   . Diabetes Father   . Diabetes Sister   . Diabetes Brother     Social History:  reports that he has quit smoking. He has never used smokeless tobacco. He reports that he drinks about 0.6 oz of  alcohol per week . He reports that he does not use drugs.  Allergies:  Allergies  Allergen Reactions  . Other     Muscle relaxant medication    Current Facility-Administered Medications  Medication Dose Route Frequency Provider Last Rate Last Dose  . 0.9 %  sodium chloride infusion   Intravenous Continuous Debbe Odea, MD 100 mL/hr at 03/02/16 1313    . acetaminophen (TYLENOL) tablet 650 mg  650 mg Oral Q6H PRN Debbe Odea, MD   650 mg at 03/02/16 1146   Or  . acetaminophen (TYLENOL) suppository 650 mg  650 mg Rectal Q6H PRN Debbe Odea, MD      . cholecalciferol (VITAMIN D) tablet 2,000 Units  2,000 Units Oral Q lunch Debbe Odea, MD   2,000 Units at 03/02/16 1534  . clopidogrel (PLAVIX) tablet 75 mg  75 mg Oral Daily Debbe Odea, MD   75 mg at 03/02/16 1533  . doxycycline (VIBRAMYCIN) 100 mg in dextrose 5 % 250 mL IVPB  100 mg Intravenous Q12H Debbe Odea, MD 125 mL/hr at 03/02/16 1246 100 mg at 03/02/16 1246  . enoxaparin (LOVENOX) injection 40 mg  40 mg Subcutaneous Q24H Saima Rizwan, MD      . gabapentin (NEURONTIN) capsule 800 mg  800 mg Oral TID Debbe Odea, MD   800 mg at 03/02/16 1534  . ondansetron (ZOFRAN) tablet 4 mg  4 mg Oral Q8H PRN Saima  Rizwan, MD       Or  . ondansetron (ZOFRAN) injection 4 mg  4 mg Intravenous Q6H PRN Debbe Odea, MD      . pantoprazole (PROTONIX) EC tablet 40 mg  40 mg Oral Daily Debbe Odea, MD   40 mg at 03/02/16 1534  . piperacillin-tazobactam (ZOSYN) IVPB 3.375 g  3.375 g Intravenous Q8H Leonard Schwartz, MD      . potassium chloride 10 mEq in 100 mL IVPB  10 mEq Intravenous Q1 Hr x 5 Saima Rizwan, MD 100 mL/hr at 03/02/16 1735 10 mEq at 03/02/16 1735  . senna-docusate (Senokot-S) tablet 1 tablet  1 tablet Oral QHS PRN Debbe Odea, MD      . Derrill Memo ON 03/03/2016] vancomycin (VANCOCIN) IVPB 750 mg/150 ml premix  750 mg Intravenous Q12H Leonard Schwartz, MD      . zolpidem [facility]) tablet 2.5 mg  2.5 mg Oral QHS PRN Debbe Odea, MD          Results for orders placed or performed during the hospital encounter of 03/02/16 (from the past 48 hour(s))  CBC with Differential     Status: Abnormal   Collection Time: 03/02/16  8:54 AM  Result Value Ref Range   WBC 13.8 (H) 4.0 - 10.5 K/uL   RBC 4.46 4.22 - 5.81 MIL/uL   Hemoglobin 14.3 13.0 - 17.0 g/dL   HCT 42.3 39.0 - 52.0 %   MCV 94.8 78.0 - 100.0 fL   MCH 32.1 26.0 - 34.0 pg   MCHC 33.8 30.0 - 36.0 g/dL   RDW 13.0 11.5 - 15.5 %   Platelets 186 150 - 400 K/uL   Neutrophils Relative % 93 %   Neutro Abs 12.9 (H) 1.7 - 7.7 K/uL   Lymphocytes Relative 2 %   Lymphs Abs 0.3 (L) 0.7 - 4.0 K/uL   Monocytes Relative 3 %   Monocytes Absolute 0.5 0.1 - 1.0 K/uL   Eosinophils Relative 1 %   Eosinophils Absolute 0.2 0.0 - 0.7 K/uL   Basophils Relative 0 %   Basophils Absolute 0.0 0.0 - 0.1 K/uL  Comprehensive metabolic panel     Status: Abnormal   Collection Time: 03/02/16  8:54 AM  Result Value Ref Range   Sodium 137 135 - 145 mmol/L   Potassium 2.8 (L) 3.5 - 5.1 mmol/L    Comment: RESULT REPEATED AND VERIFIED DELTA CHECK NOTED    Chloride 102 101 - 111 mmol/L   CO2 25 22 - 32 mmol/L   Glucose, Bld 136 (H) 65 - 99 mg/dL   BUN 13 6 - 20 mg/dL   Creatinine, Ser 1.09 0.61 - 1.24 mg/dL   Calcium 8.7 (L) 8.9 - 10.3 mg/dL   Total Protein 6.7 6.5 - 8.1 g/dL   Albumin 3.6 3.5 - 5.0 g/dL   AST 35 15 - 41 U/L   ALT 29 17 - 63 U/L   Alkaline Phosphatase 35 (L) 38 - 126 U/L   Total Bilirubin 2.2 (H) 0.3 - 1.2 mg/dL   GFR calc non Af Amer >60 >60 mL/min   GFR calc Af Amer >60 >60 mL/min    Comment: (NOTE) The eGFR has been calculated using the CKD EPI equation. This calculation has not been validated in all clinical situations. eGFR's persistently <60 mL/min signify possible Chronic Kidney Disease.    Anion gap 10 5 - 15  Lipase, blood     Status: None   Collection Time: 03/02/16  8:54 AM  Result  Value Ref Range   Lipase 19 11 - 51 U/L  Brain natriuretic peptide      Status: None   Collection Time: 03/02/16  8:55 AM  Result Value Ref Range   B Natriuretic Peptide 26.5 0.0 - 100.0 pg/mL  I-Stat Troponin, ED (not at [facility])     Status: None   Collection Time: 03/02/16  8:58 AM  Result Value Ref Range   Troponin i, poc 0.01 0.00 - 0.08 ng/mL   Comment 3            Comment: Due to the release kinetics of cTnI, a negative result within the first hours of the onset of symptoms does not rule out myocardial infarction with certainty. If myocardial infarction is still suspected, repeat the test at appropriate intervals.   I-Stat CG4 Lactic Acid, ED     Status: Abnormal   Collection Time: 03/02/16  9:17 AM  Result Value Ref Range   Lactic Acid, Venous 4.11 (HH) 0.5 - 1.9 mmol/L   Comment NOTIFIED PHYSICIAN   Urinalysis, Routine w reflex microscopic (not at [facility])     Status: None   Collection Time: 03/02/16 10:42 AM  Result Value Ref Range   Color, Urine YELLOW YELLOW   APPearance CLEAR CLEAR   Specific Gravity, Urine 1.009 1.005 - 1.030   pH 6.0 5.0 - 8.0   Glucose, UA NEGATIVE NEGATIVE mg/dL   Hgb urine dipstick NEGATIVE NEGATIVE   Bilirubin Urine NEGATIVE NEGATIVE   Ketones, ur NEGATIVE NEGATIVE mg/dL   Protein, ur NEGATIVE NEGATIVE mg/dL   Nitrite NEGATIVE NEGATIVE   Leukocytes, UA NEGATIVE NEGATIVE    Comment: MICROSCOPIC NOT DONE ON URINES WITH NEGATIVE PROTEIN, BLOOD, LEUKOCYTES, NITRITE, OR GLUCOSE <1000 mg/dL.  Magnesium     Status: Abnormal   Collection Time: 03/02/16 11:19 AM  Result Value Ref Range   Magnesium 1.4 (L) 1.7 - 2.4 mg/dL  Lactic acid, plasma     Status: Abnormal   Collection Time: 03/02/16 11:19 AM  Result Value Ref Range   Lactic Acid, Venous 2.2 (HH) 0.5 - 1.9 mmol/L    Comment: CRITICAL RESULT CALLED TO, READ BACK BY AND VERIFIED WITH: RAND,K AT 1230 ON 102217 BY HOOKER,B   Procalcitonin     Status: None   Collection Time: 03/02/16 11:19 AM  Result Value Ref Range   Procalcitonin 17.51 ng/mL    Comment:         Interpretation: PCT >= 10 ng/mL: Important systemic inflammatory response, almost exclusively due to severe bacterial sepsis or septic shock. (NOTE)         ICU PCT Algorithm               Non ICU PCT Algorithm    ----------------------------     ------------------------------         PCT < 0.25 ng/mL                 PCT < 0.1 ng/mL     Stopping of antibiotics            Stopping of antibiotics       strongly encouraged.               strongly encouraged.    ----------------------------     ------------------------------       PCT level decrease by               PCT < 0.25 ng/mL       >= 80% from peak  PCT       OR PCT 0.25 - 0.5 ng/mL          Stopping of antibiotics                                             encouraged.     Stopping of antibiotics           encouraged.    ----------------------------     ------------------------------       PCT level decrease by              PCT >= 0.25 ng/mL       < 80% from peak PCT        AND PCT >= 0.5 ng/mL             Continuing antibiotics                                              encouraged.       Continuing antibiotics            encouraged.    ----------------------------     ------------------------------     PCT level increase compared          PCT > 0.5 ng/mL         with peak PCT AND          PCT >= 0.5 ng/mL             Escalation of antibiotics                                          strongly encouraged.      Escalation of antibiotics        strongly encouraged.   Protime-INR     Status: None   Collection Time: 03/02/16 11:19 AM  Result Value Ref Range   Prothrombin Time 13.5 11.4 - 15.2 seconds   INR 1.03   APTT     Status: None   Collection Time: 03/02/16 11:19 AM  Result Value Ref Range   aPTT 26 24 - 36 seconds  I-Stat CG4 Lactic Acid, ED     Status: Abnormal   Collection Time: 03/02/16 11:51 AM  Result Value Ref Range   Lactic Acid, Venous 2.55 (HH) 0.5 - 1.9 mmol/L   Comment NOTIFIED PHYSICIAN   Blood gas,  arterial     Status: Abnormal   Collection Time: 03/02/16 12:10 PM  Result Value Ref Range   FIO2 1.00    Delivery systems OXYGEN MASK    pH, Arterial 7.357 7.350 - 7.450   pCO2 arterial 50.1 (H) 32.0 - 48.0 mmHg   pO2, Arterial 119 (H) 83.0 - 108.0 mmHg   Bicarbonate 26.5 20.0 - 28.0 mmol/L   Acid-Base Excess 1.7 0.0 - 2.0 mmol/L   O2 Saturation 97.5 %   Patient temperature 103.0    Collection site LEFT RADIAL    Drawn by COLLECTED BY RT    Sample type ARTERIAL DRAW    Allens test (pass/fail) PASS PASS  MRSA PCR Screening     Status: None   Collection Time: 03/02/16  2:04 PM  Result Value Ref Range   MRSA by PCR NEGATIVE NEGATIVE    Comment:        The GeneXpert MRSA Assay (FDA approved for NASAL specimens only), is one component of a comprehensive MRSA colonization surveillance program. It is not intended to diagnose MRSA infection nor to guide or monitor treatment for MRSA infections.   Lactic acid, plasma     Status: Abnormal   Collection Time: 03/02/16  2:23 PM  Result Value Ref Range   Lactic Acid, Venous 2.1 (HH) 0.5 - 1.9 mmol/L    Comment: CRITICAL RESULT CALLED TO, READ BACK BY AND VERIFIED WITH: DILLON,S AT 1510 ON 102217 BY HOOKER,B     Dg Chest Portable 1 View  Result Date: 03/02/2016 CLINICAL DATA:  Hypoxia EXAM: PORTABLE CHEST 1 VIEW COMPARISON:  02/27/2016 FINDINGS: Cardiac shadow is within normal limits. The lungs are well aerated bilaterally. Mild interstitial changes are noted without focal confluent infiltrate. No sizable effusion is seen. No bony abnormality is noted. IMPRESSION: Mild interstitial changes without acute abnormality. Electronically Signed   By: Inez Catalina M.D.   On: 03/02/2016 09:18   I personally reviewed CT scan and ultrasound from his recent hospitalization. CT scan was unremarkable with mild mediastinal lymphadenopathy but no acute abdominal process.    Gallbladder ultrasound showed cholelithiasis but no gallbladder wall  thickening or pericholecystic fluid or Murphy sign.  Review of Systems  Constitutional: Positive for malaise/fatigue. Negative for chills and fever.  Respiratory: Negative.   Cardiovascular: Negative.   Gastrointestinal: Positive for diarrhea and nausea. Negative for abdominal pain, blood in stool, constipation and vomiting.  Genitourinary: Positive for frequency and urgency.  Neurological: Positive for weakness.   Blood pressure (!) 112/53, pulse 79, temperature 99.7 F (37.6 C), temperature source Core (Comment), resp. rate 19, height _0  (1.753 m), weight 93.1 kg (205 lb 4 oz), SpO2 91 %. Physical Exam General: Drowsy but responsive Caucasian male, in no distress Skin: Warm and dry without rash or infection. HEENT: No palpable masses or thyromegaly. Sclera nonicteric. Pupils equal round and reactive.  Lymph nodes: No cervical, supraclavicular, or inguinal nodes palpable. Lungs: Breath sounds clear and equal without increased work of breathing Cardiovascular: Regular rate and rhythm without murmur. No JVD or edema. Peripheral pulses intact. Abdomen: Well-healed upper midline incision. Nondistended. Possible minimal right upper quadrant tenderness to deep palpation but no guarding. No masses palpable. No organomegaly. No palpable hernias. Extremities: No edema or joint swelling or deformity. No chronic venous stasis changes. Neurologic: Drowsy but oriented to person place and situation. No gross motor or sensory deficits  Assessment/Plan: Apparent recurrent septic episode with major symptom being nausea and vomiting, leukocytosis with tachycardia and tachypnea which is improved after resuscitation and mild lactic acidosis.  Abdominal exam is not very remarkable. Ultrasound did show cholelithiasis but no obvious evidence of acute cholecystitis. He does have mildly elevated bilirubin. His gallbladder could potentially be the source of his illness but this is not completely clear. I'm going  to order a HIDA scan to further evaluate the gallbladder. Hold Plavix now for possible surgery. If HIDA is negative might consider upper endoscopy with his history of gastric ulcer and repeated episodes of nausea and vomiting.  Anil Havard T 03/02/2016, 5:40 PM

## 2016-03-02 NOTE — ED Triage Notes (Signed)
Pt c/o nausea and dry heaves onset this morning, some drowsiness. Started Cephalexin tx for UTI 2 days ago. Hypoxic, 88% on 6L/min. Denies SOB, pain.

## 2016-03-02 NOTE — Progress Notes (Signed)
Pharmacy Antibiotic Note  David Li is a 75 y.o. male with PMHx significant for idiopathic progressive peripheral neuropathy, possible TIA, and BPH with neurogenic bladder vs chronic urinary retention who self-caths 1-2x/day, recently hospitalized for sepsis, respiratory failure and N/V, discharged on 10/21, readmitted on 03/02/2016 with nausea. Pharmacy has been consulted for Vancomycin and Zosyn dosing for sepsis.    10/22:  SCr 1.09, increased from baseline.  CrCl ~69 ml/min (N61) WBC and LA elevated.  Plan: Zosyn 3.375g IV q8h (infuse over 4 hours) Vancomycin 1500mg  IV x1, then 750mg  IV q12h Watch renal function closely F/u cultures, VT at Css, clinical course   Temp (24hrs), Avg:98.5 F (36.9 C), Min:98.5 F (36.9 C), Max:98.5 F (36.9 C)   Recent Labs Lab 02/27/16 2212 02/28/16 0018 02/28/16 0249 02/28/16 0721 02/29/16 0502 03/01/16 0536 03/02/16 0854 03/02/16 0917  WBC 17.6*  --   --  10.0 5.9  --  13.8*  --   CREATININE 0.99  --   --  0.91 0.75 0.75 1.09  --   LATICACIDVEN  --  4.21* 2.3* 1.7  --   --   --  4.11*    Estimated Creatinine Clearance: 69.2 mL/min (by C-G formula based on SCr of 1.09 mg/dL).    Allergies  Allergen Reactions  . Other     Muscle relaxant medication    Antimicrobials this admission: 10/22 Vancomycin >>  10/22 Zosyn >>   Dose adjustments this admission:  Microbiology results: 10/19 BCx: NGTD 10/19 UCx: NGF  Thank you for allowing pharmacy to be a part of this patient's care.  Ralene Bathe, PharmD, BCPS 03/02/2016, 11:20 AM  Pager: [phone]

## 2016-03-02 NOTE — ED Notes (Signed)
PT have urinal

## 2016-03-02 NOTE — ED Notes (Signed)
Questioned MD regarding lactic result and sepsis.  Per MD, pt is not a sepsis patient.  No antibiotics at this time.

## 2016-03-02 NOTE — H&P (Addendum)
History and Physical    David Li RAQ:[id number] DOB: Feb 08, 1941 DOA: 03/02/2016    PCP: Lilian Coma, MD  Patient coming from: home   Chief Complaint: nausea  HPI: David Li is a 75 y.o. male with medical history significant of  BPH s/p TURP who has to self cath 1-2 times a day for 7  Months now, idiopathic progressive ascending peripheral neuropathy, possible TIA in the past placed on Plavix by Dr Erling Cruz (2008). He states he urinates but only self caths 1 or 2 times a day to get out the residual urine. Urologist is Dr Allen Kell.  He was discharged from the hospital yesterday after being acute resp failure, nausea/ vomiting. He was treated with Rocephin and Zithromax and discharged on Keflex. He states he awoke around 2 AM with severe nausea. He is very short of breath right now and moaning and, therefore, history is difficult to obtain. He has not had any vomiting or abdominal pain.  RUQ ultrasound on 10/20 showed multiple gallstones. He has had a non-productive cough for about 1-2 wks. No change in urination- continues to self cath. I have asked him about antibiotic use prior to last admission. He tells me that he was on Macrobid for 5 days and presented to the hospital prior to finishing this. He takes Trimethoprim routinely, No new rash. No diarrhea.  Addendum: wife is here now to give history- he has been having frequent UTIs for the 7 months that he has had to self cath and has been on frequent courses of antibiotics as outpt, the most recent one was Macrobid.    ED Course:  Appeared cyanotic on arrival, pulse ox low 80s on room air Lactic acid 4.11, T bili 2.2, Alk ph 35, K 2.8, Mg pending, WBC 13.8- 12.9 Neutros BNP 26.5, TN I 0.01 10/19 CTA chest, abd pelvis - Mild nonspecific mediastinal lymphadenopathy. Trace bilateral pleural effusions. CXR today: mild interstitial changes  Given 60 mg IV Lasix and Phnergan   Review of Systems:  Difficult to obtain Noted to  have left leg swelling on exam- he states he often gets this when he has to stand for long periods All other systems reviewed and apart from HPI, are negative.  Past Medical History:  Diagnosis Date  . BPH (benign prostatic hyperplasia)    Catherize 1-2 times daily.  . Neuromuscular disorder ([place])   . Perforated ulcer ([place])   . Stroke [facility])    pt states the Plavix is preventative due to the family history of stroke    Past Surgical History:  Procedure Laterality Date  . HERNIA REPAIR    . TONSILLECTOMY    . TRANSURETHRAL RESECTION OF PROSTATE      Social History:   reports that he has quit smoking. He has never used smokeless tobacco. He reports that he drinks about 0.6 oz of alcohol per week . He reports that he does not use drugs.  Allergies  Allergen Reactions  . Other     Muscle relaxant medication    Family History  Problem Relation Age of Onset  . Stroke Mother   . Diabetes Mother   . Stroke Father   . Diabetes Father   . Diabetes Sister   . Diabetes Brother      Prior to Admission medications   Medication Sig Start Date End Date Taking? Authorizing Provider  ACYCLOVIR PO Take 400 mg by mouth as needed (breakout).     Historical Provider, MD  cephALEXin [facility])  500 MG capsule Take 1 capsule (500 mg total) by mouth 3 (three) times daily. 03/01/16   Florencia Reasons, MD  cholecalciferol (VITAMIN D) 1000 UNITS tablet Take 2,000 Units by mouth daily with lunch.     Historical Provider, MD  clopidogrel (PLAVIX) 75 MG tablet Take 1 tablet (75 mg total) by mouth daily. 03/13/15   Marcial Pacas, MD  gabapentin (NEURONTIN) 800 MG tablet Take 1 tablet (800 mg total) by mouth 3 (three) times daily. 11/27/15   Marcial Pacas, MD  omeprazole (PRILOSEC) 20 MG capsule Take 20 mg by mouth daily.    Historical Provider, MD  trimethoprim (TRIMPEX) 100 MG tablet Take 1 tablet by mouth daily. 02/06/16   Historical Provider, MD  zolpidem (AMBIEN) 10 MG tablet Take 2.5 mg by mouth at bedtime as needed  for sleep.     Historical Provider, MD    Physical Exam: Vitals:   03/02/16 0831 03/02/16 0951 03/02/16 1030  BP:  138/58 142/75  Pulse: 107 91 89  Resp:  (!) 32 (!) 33  Temp: 98.5 F (36.9 C)    SpO2:  100% 97%      Constitutional: appears uncomfortable, moaning, restless Eyes: PERTLA, lids and conjunctivae normal ENMT: Mucous membranes are dry Posterior pharynx clear of any exudate or lesions. Normal dentition.  Neck: normal, supple, no masses, no thyromegaly Respiratory: clear to auscultation bilaterally, no wheezing, no crackles. Normal respiratory effort. No accessory muscle use.  Cardiovascular: S1 & S2 heard, regular rate and rhythm, no murmurs / rubs / gallops.  2+ pedal pulses. No carotid bruits. Edema of left leg/calf- non-pitting Abdomen: No distension, epigastric tenderness, no masses palpated. No hepatosplenomegaly. Bowel sounds normal.  Musculoskeletal: no clubbing / cyanosis. No joint deformity upper and lower extremities. Good ROM, no contractures. Normal muscle tone.  Skin: no rashes, lesions, ulcers. No induration Neurologic: CN 2-12 grossly intact. Sensation intact, DTR normal. Strength 5/5 in all 4 limbs.  Psychiatric: Normal judgment and insight. Alert and oriented x 3. Normal mood.     Labs on Admission: I have personally reviewed following labs and imaging studies  CBC:  Recent Labs Lab 02/27/16 2212 02/28/16 0721 02/29/16 0502 03/02/16 0854  WBC 17.6* 10.0 5.9 13.8*  NEUTROABS  --  8.7*  --  12.9*  HGB 15.4 13.1 12.3* 14.3  HCT 43.6 39.2 37.0* 42.3  MCV 92.8 95.8 96.1 94.8  PLT 179 148* 154 831   Basic Metabolic Panel:  Recent Labs Lab 02/27/16 2212 02/28/16 0721 02/29/16 0502 03/01/16 0536 03/02/16 0854  NA 133* 137 143 143 137  K 3.7 4.0 3.6 4.0 2.8*  CL 100* 107 111 110 102  CO2 _0 GLUCOSE 174* 110* 96 88 136*  BUN _1 CREATININE 0.99 0.91 0.75 0.75 1.09  CALCIUM 8.3* 7.6* 8.2* 8.2* 8.7*  MG  --   --   1.9 1.9  --    GFR: Estimated Creatinine Clearance: 69.2 mL/min (by C-G formula based on SCr of 1.09 mg/dL). Liver Function Tests:  Recent Labs Lab 02/27/16 2212 02/28/16 0721 02/29/16 0502 03/01/16 0536 03/02/16 0854  AST 50* 48* 38 30 35  ALT _2 ALKPHOS 43 36* 30* 32* 35*  BILITOT 2.5* 1.8* 1.1 1.1 2.2*  PROT 7.2 6.1* 5.8* 5.5* 6.7  ALBUMIN 3.6 3.0* 2.9* 2.9* 3.6    Recent Labs Lab 02/27/16 2212 03/02/16 0854  LIPASE 13 19   No results for input(s):  AMMONIA in the last 168 hours. Coagulation Profile: No results for input(s): INR, PROTIME in the last 168 hours. Cardiac Enzymes:  Recent Labs Lab 02/28/16 0721 02/29/16 0502  TROPONINI 0.07* 0.04*   BNP (last 3 results) No results for input(s): PROBNP in the last 8760 hours. HbA1C: No results for input(s): HGBA1C in the last 72 hours. CBG: No results for input(s): GLUCAP in the last 168 hours. Lipid Profile: No results for input(s): CHOL, HDL, LDLCALC, TRIG, CHOLHDL, LDLDIRECT in the last 72 hours. Thyroid Function Tests: No results for input(s): TSH, T4TOTAL, FREET4, T3FREE, THYROIDAB in the last 72 hours. Anemia Panel: No results for input(s): VITAMINB12, FOLATE, FERRITIN, TIBC, IRON, RETICCTPCT in the last 72 hours. Urine analysis:    Component Value Date/Time   COLORURINE AMBER (A) 02/28/2016 0050   APPEARANCEUR CLOUDY (A) 02/28/2016 0050   LABSPEC 1.046 (H) 02/28/2016 0050   PHURINE 6.0 02/28/2016 0050   GLUCOSEU NEGATIVE 02/28/2016 0050   HGBUR MODERATE (A) 02/28/2016 0050   BILIRUBINUR NEGATIVE 02/28/2016 0050   KETONESUR NEGATIVE 02/28/2016 0050   PROTEINUR 30 (A) 02/28/2016 0050   UROBILINOGEN 0.2 02/17/2007 0752   NITRITE NEGATIVE 02/28/2016 0050   LEUKOCYTESUR NEGATIVE 02/28/2016 0050   Sepsis Labs: _0 (procalcitonin:4,lacticidven:4) ) Recent Results (from the past 240 hour(s))  Blood culture (routine x 2)     Status: None (Preliminary result)   Collection Time:  02/27/16 11:59 PM  Result Value Ref Range Status   Specimen Description BLOOD LEFT ANTECUBITAL  Final   Special Requests IN PEDIATRIC BOTTLE  5CC AND ANA 5CC  Final   Culture   Final    NO GROWTH 2 DAYS Performed at [facility]    Report Status PENDING  Incomplete  Urine culture     Status: None   Collection Time: 02/28/16 12:50 AM  Result Value Ref Range Status   Specimen Description URINE, RANDOM  Final   Special Requests NONE  Final   Culture   Final    NO GROWTH 1 DAY Performed at [facility]    Report Status 02/29/2016 FINAL  Final  Blood culture (routine x 2)     Status: None (Preliminary result)   Collection Time: 02/28/16  2:14 AM  Result Value Ref Range Status   Specimen Description BLOOD LEFT ANTECUBITAL  Final   Special Requests IN PEDIATRIC BOTTLE 5CC  Final   Culture   Final    NO GROWTH 2 DAYS Performed at [facility]    Report Status PENDING  Incomplete     Radiological Exams on Admission: Dg Chest Portable 1 View  Result Date: 03/02/2016 CLINICAL DATA:  Hypoxia EXAM: PORTABLE CHEST 1 VIEW COMPARISON:  02/27/2016 FINDINGS: Cardiac shadow is within normal limits. The lungs are well aerated bilaterally. Mild interstitial changes are noted without focal confluent infiltrate. No sizable effusion is seen. No bony abnormality is noted. IMPRESSION: Mild interstitial changes without acute abnormality. Electronically Signed   By: Inez Catalina M.D.   On: 03/02/2016 09:18    EKG: Independently reviewed. NSR at 95 bpm, RAD, QTc 537  Assessment/Plan Principal Problem:   Sepsis (HCC)/   Acute respiratory failure with hypoxia  -- tachycardia, tachypnea, leukocytosis, lactic acidosis - after sepsis protocol started, he developed a temp of 103.5 - abd ultrasound showing gallstones, nauseated, epigastric pain >> Zosyn - cough - mild mediastinal lymphadenopathy on recent CT- no pneumonia-   - have placed sepsis order set- will give total 2 L NS bolus-  place  foley- O2 as needed- breathing through his mouth so using face mask-  bld cx x2, UA and U culture, - check resp viral panel & Influenza - Vanc and Zosyn & Doxy (long QT) - Addendum - UA negative-   Active Problems:  Neurogenic bladder - foley  Hypokalemia/ hypomagnesemia/ prolonged QT - replace with 6 runs IV- checked Mg+- 1.4- given 1 Gm- will give 2 more as he may lose more with lasix - follow on tele  Grade 2 diastolic CHF - received 60 mg of Lasix in ER - per ECHO 10/20/ 17 - follow pOx closely- resp distress better after 2 L NS    Nausea without vomiting - judicious use of Zofran due to prolonged QT    Hereditary and idiopathic peripheral neuropathy - follows with Neuro - neurontin   DVT prophylaxis: Lovenox Code Status: Full code  Family Communication:   Disposition Plan: SDU  Consults called:   Admission status: inpatient    [facility] MD [facility] Pager: [email or web address] Password [email or web address] 7PM-7AM, please contact night-coverage   03/02/2016, 11:18 AM

## 2016-03-02 NOTE — ED Provider Notes (Signed)
[place] DEPT Provider Note   CSN: [place]:[id number] Arrival date & time: 03/02/16  0820     History   Chief Complaint Chief Complaint  Patient presents with  . Nausea    hypoxia    HPI David Li is a 75 y.o. male.  HPI Pt c/o nausea and dry heaves onset this morning, some drowsiness. Started Cephalexin tx for UTI 2 days ago. Hypoxic, 88% on 6L/min. Denies SOB, pain. Past Medical History:  Diagnosis Date  . BPH (benign prostatic hyperplasia)    Catherize 1-2 times daily.  . Neuromuscular disorder ([place])   . Perforated ulcer ([place])   . Stroke [facility])    pt states the Plavix is preventative due to the family history of stroke    Patient Active Problem List   Diagnosis Date Noted  . Hypoxia 03/02/2016  . Pneumonia 02/28/2016  . Acute respiratory failure with hypoxia ([place]) 02/28/2016  . Nausea without vomiting 02/28/2016  . Acute respiratory failure ([place]) 02/28/2016  . Low back pain 05/28/2015  . Paresthesia 05/28/2015  . Tremor, essential 03/02/2014  . Hereditary and idiopathic peripheral neuropathy 03/01/2013  . Vasculitis ([place]) 03/01/2013    Past Surgical History:  Procedure Laterality Date  . HERNIA REPAIR    . TONSILLECTOMY    . TRANSURETHRAL RESECTION OF PROSTATE         Home Medications    Prior to Admission medications   Medication Sig Start Date End Date Taking? Authorizing Provider  ACYCLOVIR PO Take 400 mg by mouth as needed (breakout).     Historical Provider, MD  cephALEXin (KEFLEX) 500 MG capsule Take 1 capsule (500 mg total) by mouth 3 (three) times daily. 03/01/16   Florencia Reasons, MD  cholecalciferol (VITAMIN D) 1000 UNITS tablet Take 2,000 Units by mouth daily with lunch.     Historical Provider, MD  clopidogrel (PLAVIX) 75 MG tablet Take 1 tablet (75 mg total) by mouth daily. 03/13/15   Marcial Pacas, MD  gabapentin (NEURONTIN) 800 MG tablet Take 1 tablet (800 mg total) by mouth 3 (three) times daily. 11/27/15   Marcial Pacas, MD  omeprazole (PRILOSEC)  20 MG capsule Take 20 mg by mouth daily.    Historical Provider, MD  trimethoprim (TRIMPEX) 100 MG tablet Take 1 tablet by mouth daily. 02/06/16   Historical Provider, MD  zolpidem (AMBIEN) 10 MG tablet Take 2.5 mg by mouth at bedtime as needed for sleep.     Historical Provider, MD    Family History Family History  Problem Relation Age of Onset  . Stroke Mother   . Diabetes Mother   . Stroke Father   . Diabetes Father   . Diabetes Sister   . Diabetes Brother     Social History Social History  Substance Use Topics  . Smoking status: Former Research scientist (life sciences)  . Smokeless tobacco: Never Used  . Alcohol use 0.6 oz/week    1 Glasses of wine per week     Comment: daily     Allergies   Other   Review of Systems Review of Systems  Unable to perform ROS: Acuity of condition     Physical Exam Updated Vital Signs BP 142/75 (BP Location: Left Arm)   Pulse 89   Temp 98.5 F (36.9 C)   Resp (!) 33   SpO2 97%   Physical Exam  Constitutional: He is oriented to person, place, and time. He appears well-developed and well-nourished. He appears distressed.  HENT:  Head: Normocephalic and atraumatic.  Eyes:  Pupils are equal, round, and reactive to light.  Neck: Normal range of motion.  Cardiovascular: Normal rate and intact distal pulses.   Pulmonary/Chest: He is in respiratory distress.  Abdominal: Soft. Normal appearance and bowel sounds are normal. He exhibits no distension. There is no tenderness.  Musculoskeletal: Normal range of motion.  Neurological: He is alert and oriented to person, place, and time. No cranial nerve deficit.  Skin: Skin is warm and dry. No rash noted.  Psychiatric: He has a normal mood and affect. His behavior is normal.  Nursing note and vitals reviewed.    ED Treatments / Results  Labs (all labs ordered are listed, but only abnormal results are displayed) Labs Reviewed  CBC WITH DIFFERENTIAL/PLATELET - Abnormal; Notable for the following:       Result  Value   WBC 13.8 (*)    Neutro Abs 12.9 (*)    Lymphs Abs 0.3 (*)    All other components within normal limits  COMPREHENSIVE METABOLIC PANEL - Abnormal; Notable for the following:    Potassium 2.8 (*)    Glucose, Bld 136 (*)    Calcium 8.7 (*)    Alkaline Phosphatase 35 (*)    Total Bilirubin 2.2 (*)    All other components within normal limits  I-STAT CG4 LACTIC ACID, ED - Abnormal; Notable for the following:    Lactic Acid, Venous 4.11 (*)    All other components within normal limits  BRAIN NATRIURETIC PEPTIDE  LIPASE, BLOOD  URINALYSIS, ROUTINE W REFLEX MICROSCOPIC (NOT AT [facility])  MAGNESIUM  I-STAT TROPOININ, ED    EKG  EKG Interpretation  Date/Time:  Sunday March 02 2016 08:40:53 EDT Ventricular Rate:  95 PR Interval:    QRS Duration: 90 QT Interval:  427 QTC Calculation: 537 R Axis:   103 Text Interpretation:  Right and left arm electrode reversal, interpretation assumes no reversal Sinus rhythm Probable left atrial enlargement Right axis deviation Nonspecific T abnormalities, lateral leads Prolonged QT interval Confirmed by Audie Pinto  MD, Derrian Poli ([id number]) on 03/02/2016 9:16:35 AM       Radiology Dg Chest Portable 1 View  Result Date: 03/02/2016 CLINICAL DATA:  Hypoxia EXAM: PORTABLE CHEST 1 VIEW COMPARISON:  02/27/2016 FINDINGS: Cardiac shadow is within normal limits. The lungs are well aerated bilaterally. Mild interstitial changes are noted without focal confluent infiltrate. No sizable effusion is seen. No bony abnormality is noted. IMPRESSION: Mild interstitial changes without acute abnormality. Electronically Signed   By: Inez Catalina M.D.   On: 03/02/2016 09:18    Procedures Procedures (including critical care time)  Medications Ordered in ED Medications  potassium chloride 10 mEq in 100 mL IVPB (not administered)  magnesium sulfate IVPB 1 g 100 mL (not administered)  potassium chloride 10 mEq in 100 mL IVPB (not administered)  promethazine (PHENERGAN)  injection 25 mg (25 mg Intravenous Given 03/02/16 0906)  furosemide (LASIX) injection 60 mg (60 mg Intravenous Given 03/02/16 0943)     Initial Impression / Assessment and Plan / ED Course  I have reviewed the triage vital signs and the nursing notes.  Pertinent labs & imaging results that were available during my care of the patient were reviewed by me and considered in my medical decision making (see chart for details).  Clinical Course  The patient is noted to have a lactate>4. With the current information available to me, I don't think the patient is in septic shock. The lactate>4, is related to Hypoxemia.  He had similar episode during  his recent hospital stay.  CRITICAL CARE Performed by: Dot Lanes Total critical care time: 30 minutes Critical care time was exclusive of separately billable procedures and treating other patients. Critical care was necessary to treat or prevent imminent or life-threatening deterioration. Critical care was time spent personally by me on the following activities: development of treatment plan with patient and/or surrogate as well as nursing, discussions with consultants, evaluation of patient's response to treatment, examination of patient, obtaining history from patient or surrogate, ordering and performing treatments and interventions, ordering and review of laboratory studies, ordering and review of radiographic studies, pulse oximetry and re-evaluation of patient's condition.    Final Clinical Impressions(s) / ED Diagnoses   Final diagnoses:  Nausea  Hypoxia    New Prescriptions New Prescriptions   No medications on file     Leonard Schwartz, MD 03/02/16 1047

## 2016-03-02 NOTE — ED Notes (Signed)
Lactic: 4.11 MD and RN made aware.

## 2016-03-03 ENCOUNTER — Inpatient Hospital Stay (HOSPITAL_COMMUNITY): Payer: Medicare Other

## 2016-03-03 ENCOUNTER — Encounter (HOSPITAL_COMMUNITY): Payer: Self-pay

## 2016-03-03 DIAGNOSIS — K8020 Calculus of gallbladder without cholecystitis without obstruction: Secondary | ICD-10-CM

## 2016-03-03 DIAGNOSIS — K8080 Other cholelithiasis without obstruction: Secondary | ICD-10-CM

## 2016-03-03 LAB — BASIC METABOLIC PANEL
ANION GAP: 6 (ref 5–15)
BUN: 14 mg/dL (ref 6–20)
CO2: 25 mmol/L (ref 22–32)
Calcium: 7.6 mg/dL — ABNORMAL LOW (ref 8.9–10.3)
Chloride: 107 mmol/L (ref 101–111)
Creatinine, Ser: 1.01 mg/dL (ref 0.61–1.24)
GFR calc Af Amer: >60 mL/min (ref >60)
GFR calc non Af Amer: >60 mL/min (ref >60)
GLUCOSE: 111 mg/dL — AB (ref 65–99)
POTASSIUM: 3.4 mmol/L — AB (ref 3.5–5.1)
Sodium: 138 mmol/L (ref 135–145)

## 2016-03-03 LAB — CBC
HEMATOCRIT: 33.4 % — AB (ref 39.0–52.0)
HEMOGLOBIN: 11.3 g/dL — AB (ref 13.0–17.0)
MCH: 32.6 pg (ref 26.0–34.0)
MCHC: 33.8 g/dL (ref 30.0–36.0)
MCV: 96.3 fL (ref 78.0–100.0)
Platelets: 160 10*3/uL (ref 150–400)
RBC: 3.47 MIL/uL — ABNORMAL LOW (ref 4.22–5.81)
RDW: 13.5 % (ref 11.5–15.5)
WBC: 10.2 10*3/uL (ref 4.0–10.5)

## 2016-03-03 LAB — LEGIONELLA PNEUMOPHILA SEROGP 1 UR AG: L. PNEUMOPHILA SEROGP 1 UR AG: NEGATIVE

## 2016-03-03 MED ORDER — SALINE SPRAY 0.65 % NA SOLN
1.0000 | NASAL | Status: DC | PRN
  Filled 2016-03-03: qty 44

## 2016-03-03 MED ORDER — SINCALIDE 5 MCG IJ SOLR
0.0200 ug/kg | Freq: Once | INTRAMUSCULAR | Status: AC
  Administered 2016-03-03: 1.9 ug via INTRAVENOUS
  Filled 2016-03-03: qty 5

## 2016-03-03 MED ORDER — POTASSIUM CHLORIDE CRYS ER 20 MEQ PO TBCR: 40.0000 meq | EXTENDED_RELEASE_TABLET | Freq: Once | ORAL | Status: DC

## 2016-03-03 MED ORDER — TECHNETIUM TC 99M MEBROFENIN IV KIT
7.7000 | PACK | Freq: Once | INTRAVENOUS | Status: AC | PRN
  Administered 2016-03-03: 7.7 via INTRAVENOUS

## 2016-03-03 NOTE — Progress Notes (Addendum)
PROGRESS NOTE  David Li  ERX:[id number] DOB: 01-15-1941 DOA: 03/02/2016 PCP: Lilian Coma, MD  Urology: Dr. McDiarmid GI: Dr. Cristina Gong  Brief Narrative: David Li is a 75 y.o. male with medical history significant of  BPH s/p TURP with urine retention requiring self-cath 1-2x daily, recurrent UTIs on trimethoprim ppx, perforated PUD, idiopathic progressive ascending peripheral neuropathy, possible TIA in the past placed on Plavix by Dr Erling Cruz (2008).   He was discharged from the hospital yesterday after being acute resp failure, nausea/ vomiting. He was treated with Rocephin and Zithromax and discharged on Keflex though he never took this. He states he awoke around 2 AM with severe nausea. He denied abdominal pain and vomiting. Endorses diarrhea without blood/mucous. RUQ ultrasound on 10/20 showed multiple gallstones. He also endorses nonproductive cough for 2 weeks without fever. On arrival he was very short of breath, cyanotic w/SpO2 in low 80%'s.   Lactic acid 4.11, T bili 2.2, Alk ph 35, K 2.8, Mg pending, WBC 13.8- 12.9 Neutros BNP 26.5, TN I 0.01 10/19 CTA chest, abd pelvis - Mild nonspecific mediastinal lymphadenopathy. Trace bilateral pleural effusions. CXR today: mild interstitial changes  He was given 60 mg IV Lasix and Phnergan and admitted 10/22 to SDU. General surgery plans for HIDA scan.   Assessment & Plan: Principal Problem:   Sepsis ([place]) Active Problems:   Hereditary and idiopathic peripheral neuropathy   Acute respiratory failure with hypoxia (HCC)   Nausea without vomiting   Prolonged Q-T interval on ECG   Hypokalemia   Neurogenic bladder   Acute respiratory failure with hypoxia due to sepsis syndrome from unknown source: LA 4.11 > 2.1, PCT 17.51. Presumptive source at recent admission was UTI vs. pneumonia. No evidence of either at this time.  - Flu negative: Started on vanc for healthcare exposure, but will DC w/neg MRSA - Continue zosyn and  doxycycline for ?intra-abdominal source - Continue IVF, hold lasix - ?ALI due to sepsis, consider repeat CXR in AM or with worsening.   Cholelithiasis: Without convincing evidence of inflammation on imaging.  - Plan for HIDA today per general surgery.  - Consult GI (Buccini) if HIDA negative, with h/o perforated ulcer on PPI, no NSAIDs.  - Follow blood and urine cultures - Zosyn & Doxy (long QT) - Hold plavix for possible surgery  History of perforated ulcer: No abd pain. Hgb dropped with IV resuscitation without melena or other bleeding. - Continue PPI - Monitor CBC  Chronic BOO s/p TURP: UA completely negative.  - Foley  Hypokalemia/ hypomagnesemia/ prolonged QT - Repleted, continue K repletion and recheck - continue telemetry  Grade 2 diastolic CHF on echo 95/09: BNP 26.5, trop neg. Received lasix in ED.  - Appears euvolemic on exam.   Nausea without vomiting - judicious use of Zofran due to prolonged QT    Hereditary and idiopathic peripheral neuropathy: follows with Neuro - Continue home neurontin  DVT prophylaxis: Lovenox Code Status: Full code Family Communication: None at bedside Disposition Plan: SDU > telemetry  Consultants:   Gen surgery  Procedures:   None  Antimicrobials:  Vanc, zosyn, doxy (10/22 >> )  Subjective: Pt feels much better this AM. Slept well. No pain, nausea controlled, no emesis. Some diarrhea 1-2 times daily. +cough without sputum. No other complaints.   Objective: Vitals:   03/03/16 0300 03/03/16 0400 03/03/16 0408 03/03/16 0500  BP: (!) 93/41 (!) 120/59  102/70  Pulse: 72 75  (!) 49  Resp: 20 (!) 24  (!)  26  Temp:   98.5 F (36.9 C)   TempSrc:   Oral   SpO2: 96% 93%  (!) 83%  Weight:      Height:        Intake/Output Summary (Last 24 hours) at 03/03/16 0849 Last data filed at 03/03/16 0500  Gross per 24 hour  Intake          4914.58 ml  Output             3095 ml  Net          1819.58 ml   Filed Weights    03/02/16 1400  Weight: 93.1 kg (205 lb 4 oz)    Examination: General exam: 75 y.o. male in no distress, sleeping soundly upon entering room. Respiratory system: Non-labored breathing on 2L by [place], drops to 89% when talking with me without oxygen on. Clear to auscultation. Cardiovascular system: Regular rate and rhythm. No murmur, rub, or gallop. No JVD, and no pedal edema. Gastrointestinal system: Abdomen soft, non-tender, non-distended, with normoactive bowel sounds. No organomegaly or masses felt. Midline surgical incision.  Central nervous system: Alert and oriented. No focal neurological deficits. Extremities: Warm, no deformities Skin: No rashes, lesions no ulcers Psychiatry: Judgement and insight appear normal. Mood & affect appropriate.   Data Reviewed: I have personally reviewed following labs and imaging studies  CBC:  Recent Labs Lab 02/27/16 2212 02/28/16 0721 02/29/16 0502 03/02/16 0854 03/03/16 0316  WBC 17.6* 10.0 5.9 13.8* 10.2  NEUTROABS  --  8.7*  --  12.9*  --   HGB 15.4 13.1 12.3* 14.3 11.3*  HCT 43.6 39.2 37.0* 42.3 33.4*  MCV 92.8 95.8 96.1 94.8 96.3  PLT 179 148* 154 186 423   Basic Metabolic Panel:  Recent Labs Lab 02/29/16 0502 03/01/16 0536 03/02/16 0854 03/02/16 1119 03/02/16 2000 03/03/16 0316  NA 143 143 137  --  138 138  K 3.6 4.0 2.8*  --  3.8 3.4*  CL 111 110 102  --  108 107  CO2 '27 24 25  ' --  24 25  GLUCOSE 96 88 136*  --  110* 111*  BUN '12 9 13  ' --  15 14  CREATININE 0.75 0.75 1.09  --  0.95 1.01  CALCIUM 8.2* 8.2* 8.7*  --  7.6* 7.6*  MG 1.9 1.9  --  1.4* 2.1  --    GFR: Estimated Creatinine Clearance: 73.4 mL/min (by C-G formula based on SCr of 1.01 mg/dL). Liver Function Tests:  Recent Labs Lab 02/27/16 2212 02/28/16 0721 02/29/16 0502 03/01/16 0536 03/02/16 0854  AST 50* 48* 38 30 35  ALT '27 25 28 26 29  ' ALKPHOS 43 36* 30* 32* 35*  BILITOT 2.5* 1.8* 1.1 1.1 2.2*  PROT 7.2 6.1* 5.8* 5.5* 6.7  ALBUMIN 3.6 3.0* 2.9*  2.9* 3.6    Recent Labs Lab 02/27/16 2212 03/02/16 0854  LIPASE 13 19   No results for input(s): AMMONIA in the last 168 hours. Coagulation Profile:  Recent Labs Lab 03/02/16 1119  INR 1.03   Cardiac Enzymes:  Recent Labs Lab 02/28/16 0721 02/29/16 0502  TROPONINI 0.07* 0.04*   BNP (last 3 results) No results for input(s): PROBNP in the last 8760 hours. HbA1C: No results for input(s): HGBA1C in the last 72 hours. CBG: No results for input(s): GLUCAP in the last 168 hours. Lipid Profile: No results for input(s): CHOL, HDL, LDLCALC, TRIG, CHOLHDL, LDLDIRECT in the last 72 hours. Thyroid Function Tests: No results  for input(s): TSH, T4TOTAL, FREET4, T3FREE, THYROIDAB in the last 72 hours. Anemia Panel: No results for input(s): VITAMINB12, FOLATE, FERRITIN, TIBC, IRON, RETICCTPCT in the last 72 hours. Urine analysis:    Component Value Date/Time   COLORURINE YELLOW 03/02/2016 [place] 03/02/2016 1042   LABSPEC 1.009 03/02/2016 1042   PHURINE 6.0 03/02/2016 1042   GLUCOSEU NEGATIVE 03/02/2016 1042   HGBUR NEGATIVE 03/02/2016 1042   BILIRUBINUR NEGATIVE 03/02/2016 1042   KETONESUR NEGATIVE 03/02/2016 1042   PROTEINUR NEGATIVE 03/02/2016 1042   UROBILINOGEN 0.2 02/17/2007 0752   NITRITE NEGATIVE 03/02/2016 1042   LEUKOCYTESUR NEGATIVE 03/02/2016 1042   Sepsis Labs: '@LABRCNTIP' (procalcitonin:4,lacticidven:4)  ) Recent Results (from the past 240 hour(s))  Blood culture (routine x 2)     Status: None (Preliminary result)   Collection Time: 02/27/16 11:59 PM  Result Value Ref Range Status   Specimen Description BLOOD LEFT ANTECUBITAL  Final   Special Requests IN PEDIATRIC BOTTLE  5CC AND ANA 5CC  Final   Culture   Final    NO GROWTH 3 DAYS Performed at [facility]    Report Status PENDING  Incomplete  Urine culture     Status: None   Collection Time: 02/28/16 12:50 AM  Result Value Ref Range Status   Specimen Description URINE,  RANDOM  Final   Special Requests NONE  Final   Culture   Final    NO GROWTH 1 DAY Performed at [facility]    Report Status 02/29/2016 FINAL  Final  Blood culture (routine x 2)     Status: None (Preliminary result)   Collection Time: 02/28/16  2:14 AM  Result Value Ref Range Status   Specimen Description BLOOD LEFT ANTECUBITAL  Final   Special Requests IN PEDIATRIC BOTTLE 5CC  Final   Culture   Final    NO GROWTH 3 DAYS Performed at [facility]    Report Status PENDING  Incomplete  MRSA PCR Screening     Status: None   Collection Time: 03/02/16  2:04 PM  Result Value Ref Range Status   MRSA by PCR NEGATIVE NEGATIVE Final    Comment:        The GeneXpert MRSA Assay (FDA approved for NASAL specimens only), is one component of a comprehensive MRSA colonization surveillance program. It is not intended to diagnose MRSA infection nor to guide or monitor treatment for MRSA infections.      Radiology Studies: Dg Chest Portable 1 View  Result Date: 03/02/2016 CLINICAL DATA:  Hypoxia EXAM: PORTABLE CHEST 1 VIEW COMPARISON:  02/27/2016 FINDINGS: Cardiac shadow is within normal limits. The lungs are well aerated bilaterally. Mild interstitial changes are noted without focal confluent infiltrate. No sizable effusion is seen. No bony abnormality is noted. IMPRESSION: Mild interstitial changes without acute abnormality. Electronically Signed   By: Inez Catalina M.D.   On: 03/02/2016 09:18    Scheduled Meds: . cholecalciferol  2,000 Units Oral Q lunch  . doxycycline (VIBRAMYCIN) IV  100 mg Intravenous Q12H  . enoxaparin (LOVENOX) injection  40 mg Subcutaneous Q24H  . gabapentin  800 mg Oral TID  . pantoprazole  40 mg Oral Daily  . piperacillin-tazobactam (ZOSYN)  IV  3.375 g Intravenous Q8H  . vancomycin  750 mg Intravenous Q12H   Continuous Infusions: . sodium chloride 100 mL/hr at 03/02/16 1313     LOS: 1 day   Time spent: 35 minutes.  Vance Gather, MD [facility] Pager [phone]  If 7PM-7AM, please contact night-coverage [email or web address] Password [facility] 03/03/2016, 8:49 AM

## 2016-03-03 NOTE — Progress Notes (Signed)
Report received from [facility]. No changes in previous assessment.

## 2016-03-03 NOTE — Progress Notes (Signed)
Progress Note: General Surgery Service   Subjective: Pain much improved  Objective: Vital signs in last 24 hours: Temp:  [98.5 F (36.9 C)-99.5 F (37.5 C)] 98.5 F (36.9 C) (10/23 0408) Pulse Rate:  [49-79] 75 (10/23 1530) Resp:  [14-26] 21 (10/23 1530) BP: (89-148)/(24-70) 141/67 (10/23 1500) SpO2:  [83 %-96 %] 90 % (10/23 1530) Weight:  [93 kg (205 lb)] 93 kg (205 lb) (10/23 0300)    Intake/Output from previous day: 10/22 0701 - 10/23 0700 In: 4914.6 [I.V.:1614.6; IV Piggyback:3300] Out: 3095 [Urine:3095] Intake/Output this shift: Total I/O In: 996.7 [I.V.:696.7; IV Piggyback:300] Out: 1650 [Urine:1650]  Lungs: CTAB  Cardiovascular: RRR  Abd: soft, Nt, ND  Extremities: no edema  Neuro: AOx4  Lab Results: CBC   Recent Labs  03/02/16 0854 03/03/16 0316  WBC 13.8* 10.2  HGB 14.3 11.3*  HCT 42.3 33.4*  PLT 186 160   BMET  Recent Labs  03/02/16 2000 03/03/16 0316  NA 138 138  K 3.8 3.4*  CL 108 107  CO2 24 25  GLUCOSE 110* 111*  BUN 15 14  CREATININE 0.95 1.01  CALCIUM 7.6* 7.6*   PT/INR  Recent Labs  03/02/16 1119  LABPROT 13.5  INR 1.03   ABG  Recent Labs  03/02/16 1210  PHART 7.357  HCO3 26.5    Studies/Results:  Anti-infectives: Anti-infectives    Start     Dose/Rate Route Frequency Ordered Stop   03/03/16 0000  vancomycin (VANCOCIN) IVPB 750 mg/150 ml premix  Status:  Discontinued     750 mg 150 mL/hr over 60 Minutes Intravenous Every 12 hours 03/02/16 1126 03/03/16 1120   03/02/16 2000  piperacillin-tazobactam (ZOSYN) IVPB 3.375 g     3.375 g 12.5 mL/hr over 240 Minutes Intravenous Every 8 hours 03/02/16 1124     03/02/16 1200  doxycycline (VIBRAMYCIN) 100 mg in dextrose 5 % 250 mL IVPB     100 mg 125 mL/hr over 120 Minutes Intravenous Every 12 hours 03/02/16 1132     03/02/16 1130  piperacillin-tazobactam (ZOSYN) IVPB 3.375 g     3.375 g 100 mL/hr over 30 Minutes Intravenous STAT 03/02/16 1124 03/02/16 1234   03/02/16 1130  vancomycin (VANCOCIN) 1,500 mg in sodium chloride 0.9 % 500 mL IVPB     1,500 mg 250 mL/hr over 120 Minutes Intravenous STAT 03/02/16 1126 03/02/16 1348      Medications: Scheduled Meds: . cholecalciferol  2,000 Units Oral Q lunch  . doxycycline (VIBRAMYCIN) IV  100 mg Intravenous Q12H  . enoxaparin (LOVENOX) injection  40 mg Subcutaneous Q24H  . gabapentin  800 mg Oral TID  . pantoprazole  40 mg Oral Daily  . piperacillin-tazobactam (ZOSYN)  IV  3.375 g Intravenous Q8H  . potassium chloride  40 mEq Oral Once   Continuous Infusions: . sodium chloride 100 mL/hr at 03/03/16 1444   PRN Meds:.acetaminophen **OR** acetaminophen, ondansetron **OR** ondansetron (ZOFRAN) IV, senna-docusate, sodium chloride  Assessment/Plan: Patient Active Problem List   Diagnosis Date Noted  . Cholelithiasis   . Sepsis ([place]) 03/02/2016  . Prolonged Q-T interval on ECG 03/02/2016  . Hypokalemia 03/02/2016  . Neurogenic bladder 03/02/2016  . Pneumonia 02/28/2016  . Acute respiratory failure with hypoxia ([place]) 02/28/2016  . Nausea without vomiting 02/28/2016  . Acute respiratory failure ([place]) 02/28/2016  . Low back pain 05/28/2015  . Paresthesia 05/28/2015  . Tremor, essential 03/02/2014  . Hereditary and idiopathic peripheral neuropathy 03/01/2013  . Vasculitis ([place]) 03/01/2013  Concern for acute cholecystitis -  HIDA negative for obstruction or biliary dyskinesia -recommend GI consult for this process and HCV >0 -no need for surgical intervention at this time, please call for new questions    LOS: 1 day   Mickeal Skinner, MD Pg# [phone] [facility]

## 2016-03-03 NOTE — Progress Notes (Signed)
Pt having hyperactive bowel sound, denies nausea. He did feel like ambulating to bathroom and stated relieved a lot of gas. He stated it did make him feel better. Pt had restful night able to sleep for multiple hrs and stated it made him feel better also.

## 2016-03-04 ENCOUNTER — Inpatient Hospital Stay (HOSPITAL_COMMUNITY): Payer: Medicare Other

## 2016-03-04 LAB — COMPREHENSIVE METABOLIC PANEL
ALT: 20 U/L (ref 17–63)
ANION GAP: 5 (ref 5–15)
AST: 17 U/L (ref 15–41)
Albumin: 2.6 g/dL — ABNORMAL LOW (ref 3.5–5.0)
Alkaline Phosphatase: 27 U/L — ABNORMAL LOW (ref 38–126)
BILIRUBIN TOTAL: 1.2 mg/dL (ref 0.3–1.2)
BUN: 10 mg/dL (ref 6–20)
CHLORIDE: 111 mmol/L (ref 101–111)
CO2: 25 mmol/L (ref 22–32)
Calcium: 8 mg/dL — ABNORMAL LOW (ref 8.9–10.3)
Creatinine, Ser: 0.89 mg/dL (ref 0.61–1.24)
GFR calc Af Amer: >60 mL/min (ref >60)
Glucose, Bld: 99 mg/dL (ref 65–99)
POTASSIUM: 3.6 mmol/L (ref 3.5–5.1)
Sodium: 141 mmol/L (ref 135–145)
TOTAL PROTEIN: 5.3 g/dL — AB (ref 6.5–8.1)

## 2016-03-04 LAB — CBC
HEMATOCRIT: 36.2 % — AB (ref 39.0–52.0)
HEMOGLOBIN: 12 g/dL — AB (ref 13.0–17.0)
MCH: 31.9 pg (ref 26.0–34.0)
MCHC: 33.1 g/dL (ref 30.0–36.0)
MCV: 96.3 fL (ref 78.0–100.0)
Platelets: 177 10*3/uL (ref 150–400)
RBC: 3.76 MIL/uL — AB (ref 4.22–5.81)
RDW: 13.5 % (ref 11.5–15.5)
WBC: 7.1 10*3/uL (ref 4.0–10.5)

## 2016-03-04 LAB — MAGNESIUM: Magnesium: 2 mg/dL (ref 1.7–2.4)

## 2016-03-04 LAB — URINE CULTURE: Culture: NO GROWTH

## 2016-03-04 LAB — CULTURE, BLOOD (ROUTINE X 2)
CULTURE: NO GROWTH
Culture: NO GROWTH

## 2016-03-04 MED ORDER — SODIUM CHLORIDE 0.9 % IV SOLN
INTRAVENOUS | Status: DC
  Administered 2016-03-04 – 2016-03-05 (×2): via INTRAVENOUS

## 2016-03-04 NOTE — Progress Notes (Signed)
PROGRESS NOTE  LYNCOLN LEDGERWOOD  ZOX:[id number] DOB: Feb 07, 1941 DOA: 03/02/2016 PCP: Lilian Coma, MD  Urology: Dr. McDiarmid GI: Dr. Cristina Gong  Brief Narrative: David Li is a 75 y.o. male with medical history significant of  BPH s/p TURP with urine retention requiring self-cath 1-2x daily, recurrent UTIs on trimethoprim ppx, perforated PUD, idiopathic progressive ascending peripheral neuropathy, possible TIA in the past placed on plavix by Dr Erling Cruz (2008).   He was discharged from the hospital 10/21 after having acute resp failure, nausea/ vomiting. He was treated with Rocephin and Zithromax and discharged on Keflex though he never took this. He states he awoke around 2 AM with severe nausea. He denied abdominal pain and vomiting. Endorses diarrhea without blood/mucous. RUQ ultrasound on 10/20 showed multiple gallstones. He also endorses nonproductive cough for 2 weeks without fever. On arrival he was very short of breath, cyanotic w/SpO2 in low 80%'s.   Lactic acid 4.11, T bili 2.2, Alk ph 35, K 2.8, WBC 13.8. BNP 26.5, Tn neg. 10/19 CTA chest, abd pelvis - Mild nonspecific mediastinal lymphadenopathy. Trace bilateral pleural effusions. CXR 10/22: mild interstitial changes  He was given 60 mg IV Lasix and Phenergan and admitted 10/22 to SDU. Respiratory status has improved, and he was transferred to the floor 10/23. HIDA scan was negative. GI was consulted for persistent nausea, dry heaves, and history of perforated peptic ulcer. UGI series being undertaken 10/24 with consideration for EGD 10/25.   Assessment & Plan: Principal Problem:   Sepsis ([place]) Active Problems:   Hereditary and idiopathic peripheral neuropathy   Acute respiratory failure with hypoxia (HCC)   Nausea without vomiting   Prolonged Q-T interval on ECG   Hypokalemia   Neurogenic bladder   Cholelithiasis  Acute respiratory failure with hypoxia due to sepsis syndrome from unknown source: LA 4.11 > 2.1, PCT  17.51. Presumptive source at recent admission was UTI vs. pneumonia. No evidence of either at this time. ?ALI due to sepsis from intra-abdominal source  - Flu negative: Started on vanc for healthcare exposure, but was DC w/neg MRSA - Continue zosyn  - D/C IVF, taking good PO.  -   Cholelithiasis: Without convincing evidence of inflammation on imaging, negative HIDA.  - GI consulted with h/o perforated ulcer on PPI, no NSAIDs: UGI w/ SB today, considering EGD 10/25.   - Follow blood and urine cultures - Zosyn - Hold plavix for possible surgery  History of perforated ulcer: No abd pain. Hgb dropped with IV resuscitation without melena or other bleeding. - Continue PPI - Monitor CBC  Chronic BOO s/p TURP: UA completely negative.  - Foley  Hypokalemia/ hypomagnesemia/ prolonged QT - Repleted, continue K repletion and recheck - continue telemetry - Recheck ECG  Grade 2 diastolic CHF on echo 81/19: BNP 26.5, trop neg. Received lasix in ED.  - Appears euvolemic on exam. - Holding lasix, consider restarting 10/25  Nausea without vomiting - judicious use of zofran due to prolonged QT    Hereditary and idiopathic peripheral neuropathy: follows with Neuro - Continue home neurontin  DVT prophylaxis: Lovenox Code Status: Full code Family Communication: None at bedside Disposition Plan: Telemetry >> Discharge back to home when stable.   Consultants:   Gen surgery  GI  Procedures:   None  Antimicrobials: Blood culture 10/22 NGTD  Vancomycin 10/22  Doxycycline 10/22 - 10/23  Zosyn 10/22 >>  PREVIOUS ADMISSION: Cultures from that admission no growth x5 days  CTX 10/18 - 10/20  Azithromycin 10/18  Subjective: Continues to have nausea not bad enough for antiemetics this AM, has been NPO since midnight. No pain or emesis. Some diarrhea 1-2 times daily. +cough without sputum. All around feels much better since admission.   Objective: Vitals:   03/03/16 1630 03/03/16  1658 03/03/16 2123 03/04/16 0614  BP:  (!) 152/74 138/72 140/65  Pulse:  82 73 69  Resp:  '16 18 18  ' Temp: 98.6 F (37 C) 98.6 F (37 C) 99.3 F (37.4 C) 98.1 F (36.7 C)  TempSrc: Core (Comment) Oral Oral Oral  SpO2:  95% 94% 95%  Weight:      Height:        Intake/Output Summary (Last 24 hours) at 03/04/16 1052 Last data filed at 03/04/16 1035  Gross per 24 hour  Intake           996.67 ml  Output             4200 ml  Net         -3203.33 ml   Filed Weights   03/02/16 1400 03/03/16 0300  Weight: 93.1 kg (205 lb 4 oz) 93 kg (205 lb)    Examination: General exam: 75 y.o. male in no distress  Respiratory system: Non-labored breathing ambient air. Clear to auscultation. Cardiovascular system: Regular rate and rhythm. No murmur, rub, or gallop. No JVD, and no pedal edema. Gastrointestinal system: Abdomen soft, non-tender, non-distended, with normoactive bowel sounds. No organomegaly or masses felt. Midline surgical incision.  Central nervous system: Alert and oriented. No focal neurological deficits. Extremities: Warm, no deformities Skin: No rashes, lesions no ulcers Psychiatry: Judgement and insight appear normal. Mood & affect appropriate.   Data Reviewed: I have personally reviewed following labs and imaging studies  CBC:  Recent Labs Lab 02/28/16 0721 02/29/16 0502 03/02/16 0854 03/03/16 0316 03/04/16 0514  WBC 10.0 5.9 13.8* 10.2 7.1  NEUTROABS 8.7*  --  12.9*  --   --   HGB 13.1 12.3* 14.3 11.3* 12.0*  HCT 39.2 37.0* 42.3 33.4* 36.2*  MCV 95.8 96.1 94.8 96.3 96.3  PLT 148* 154 186 160 562   Basic Metabolic Panel:  Recent Labs Lab 02/29/16 0502 03/01/16 0536 03/02/16 0854 03/02/16 1119 03/02/16 2000 03/03/16 0316 03/04/16 0514  NA 143 143 137  --  138 138 141  K 3.6 4.0 2.8*  --  3.8 3.4* 3.6  CL 111 110 102  --  108 107 111  CO2 '27 24 25  ' --  '24 25 25  ' GLUCOSE 96 88 136*  --  110* 111* 99  BUN '12 9 13  ' --  '15 14 10  ' CREATININE 0.75 0.75 1.09   --  0.95 1.01 0.89  CALCIUM 8.2* 8.2* 8.7*  --  7.6* 7.6* 8.0*  MG 1.9 1.9  --  1.4* 2.1  --  2.0   GFR: Estimated Creatinine Clearance: 83.2 mL/min (by C-G formula based on SCr of 0.89 mg/dL). Liver Function Tests:  Recent Labs Lab 02/28/16 0721 02/29/16 0502 03/01/16 0536 03/02/16 0854 03/04/16 0514  AST 48* 38 30 35 17  ALT '25 28 26 29 20  ' ALKPHOS 36* 30* 32* 35* 27*  BILITOT 1.8* 1.1 1.1 2.2* 1.2  PROT 6.1* 5.8* 5.5* 6.7 5.3*  ALBUMIN 3.0* 2.9* 2.9* 3.6 2.6*    Recent Labs Lab 02/27/16 2212 03/02/16 0854  LIPASE 13 19   No results for input(s): AMMONIA in the last 168 hours. Coagulation Profile:  Recent Labs Lab 03/02/16 1119  INR 1.03   Cardiac Enzymes:  Recent Labs Lab 02/28/16 0721 02/29/16 0502  TROPONINI 0.07* 0.04*   BNP (last 3 results) No results for input(s): PROBNP in the last 8760 hours. HbA1C: No results for input(s): HGBA1C in the last 72 hours. CBG: No results for input(s): GLUCAP in the last 168 hours. Lipid Profile: No results for input(s): CHOL, HDL, LDLCALC, TRIG, CHOLHDL, LDLDIRECT in the last 72 hours. Thyroid Function Tests: No results for input(s): TSH, T4TOTAL, FREET4, T3FREE, THYROIDAB in the last 72 hours. Anemia Panel: No results for input(s): VITAMINB12, FOLATE, FERRITIN, TIBC, IRON, RETICCTPCT in the last 72 hours. Urine analysis:    Component Value Date/Time   COLORURINE YELLOW 03/02/2016 [place] 03/02/2016 1042   LABSPEC 1.009 03/02/2016 1042   PHURINE 6.0 03/02/2016 1042   GLUCOSEU NEGATIVE 03/02/2016 1042   HGBUR NEGATIVE 03/02/2016 1042   BILIRUBINUR NEGATIVE 03/02/2016 1042   KETONESUR NEGATIVE 03/02/2016 1042   PROTEINUR NEGATIVE 03/02/2016 1042   UROBILINOGEN 0.2 02/17/2007 0752   NITRITE NEGATIVE 03/02/2016 1042   LEUKOCYTESUR NEGATIVE 03/02/2016 1042   Sepsis Labs: '@LABRCNTIP' (procalcitonin:4,lacticidven:4)  ) Recent Results (from the past 240 hour(s))  Blood culture (routine x 2)      Status: None (Preliminary result)   Collection Time: 02/27/16 11:59 PM  Result Value Ref Range Status   Specimen Description BLOOD LEFT ANTECUBITAL  Final   Special Requests IN PEDIATRIC BOTTLE  5CC AND ANA 5CC  Final   Culture   Final    NO GROWTH 4 DAYS Performed at [facility]    Report Status PENDING  Incomplete  Urine culture     Status: None   Collection Time: 02/28/16 12:50 AM  Result Value Ref Range Status   Specimen Description URINE, RANDOM  Final   Special Requests NONE  Final   Culture   Final    NO GROWTH 1 DAY Performed at [facility]    Report Status 02/29/2016 FINAL  Final  Blood culture (routine x 2)     Status: None (Preliminary result)   Collection Time: 02/28/16  2:14 AM  Result Value Ref Range Status   Specimen Description BLOOD LEFT ANTECUBITAL  Final   Special Requests IN PEDIATRIC BOTTLE 5CC  Final   Culture   Final    NO GROWTH 4 DAYS Performed at [facility]    Report Status PENDING  Incomplete  Culture, blood (x 2)     Status: None (Preliminary result)   Collection Time: 03/02/16 11:19 AM  Result Value Ref Range Status   Specimen Description BLOOD RIGHT FOREARM  Final   Special Requests BOTTLES DRAWN AEROBIC AND ANAEROBIC 5CC  Final   Culture   Final    NO GROWTH < 24 HOURS Performed at [facility]    Report Status PENDING  Incomplete  Culture, blood (x 2)     Status: None (Preliminary result)   Collection Time: 03/02/16 11:19 AM  Result Value Ref Range Status   Specimen Description BLOOD RIGHT HAND  Final   Special Requests BOTTLES DRAWN AEROBIC AND ANAEROBIC 5CC  Final   Culture   Final    NO GROWTH < 24 HOURS Performed at [facility]    Report Status PENDING  Incomplete  MRSA PCR Screening     Status: None   Collection Time: 03/02/16  2:04 PM  Result Value Ref Range Status   MRSA by PCR NEGATIVE NEGATIVE Final    Comment:  The GeneXpert MRSA Assay (FDA approved for NASAL  specimens only), is one component of a comprehensive MRSA colonization surveillance program. It is not intended to diagnose MRSA infection nor to guide or monitor treatment for MRSA infections.      Radiology Studies: Nm Hepato W/eject Fract  Result Date: 03/03/2016 CLINICAL DATA:  Cholelithiasis. Abdominal pain. Elevated liver function test. Sepsis. EXAM: NUCLEAR MEDICINE HEPATOBILIARY IMAGING WITH GALLBLADDER EF TECHNIQUE: Sequential images of the abdomen were obtained out to 60 minutes following intravenous administration of radiopharmaceutical. After slow intravenous infusion of 1.9 micrograms Cholecystokinin, gallbladder ejection fraction was determined. RADIOPHARMACEUTICALS:  7.7 mCi Tc-54mCholetec IV COMPARISON:  None. FINDINGS: Prompt uptake and biliary excretion of activity by the liver is seen. Gallbladder activity is visualized, consistent with patency of cystic duct. Biliary activity passes into small bowel, consistent with patent common bile duct. Calculated gallbladder ejection fraction is 77%. (At 60 min, normal ejection fraction is greater than 40%.) IMPRESSION: Normal hepatobiliary imaging. Patency of both cystic and common bile ducts demonstrated. Normal gallbladder ejection fraction of 77%. Electronically Signed   By: JEarle GellM.D.   On: 03/03/2016 15:09    Scheduled Meds: . cholecalciferol  2,000 Units Oral Q lunch  . enoxaparin (LOVENOX) injection  40 mg Subcutaneous Q24H  . gabapentin  800 mg Oral TID  . pantoprazole  40 mg Oral Daily  . piperacillin-tazobactam (ZOSYN)  IV  3.375 g Intravenous Q8H  . potassium chloride  40 mEq Oral Once   Continuous Infusions: . sodium chloride       LOS: 2 days   Time spent: 25 minutes.  RVance Gather MD [facility] Pager [phone] If 7PM-7AM, please contact night-coverage [email or web address] Password [email or web address] 03/04/2016, 10:52 AM

## 2016-03-04 NOTE — Care Management Note (Signed)
Case Management Note  Patient Details  Name: David Li MRN: [id number] Date of Birth: 1941-03-24  Subjective/Objective: 75 y/o m admitted w/Sepsis. From home.                   Action/Plan:d/c home.   Expected Discharge Date:                  Expected Discharge Plan:  Home/Self Care  In-House Referral:     Discharge planning Services  CM Consult  Post Acute Care Choice:    Choice offered to:     DME Arranged:    DME Agency:     HH Arranged:    HH Agency:     Status of Service:  In process, will continue to follow  If discussed at Long Length of Stay Meetings, dates discussed:    Additional Comments:  Dessa Phi, RN 03/04/2016, 3:16 PM

## 2016-03-04 NOTE — Consult Note (Signed)
Referring Provider: Dr. Bonner Puna Primary Care Physician:  Lilian Coma, MD Primary Gastroenterologist:  Dr. Cristina Gong  Reason for Consultation:  Recurrent nausea . Rule out ulcer disease  HPI: David Li is a 75 y.o. male Was recently discharged from the hospital after being treated for acute respiratory failure/nausea and vomiting came back to the hospital with worsening nausea. Patient was found to have significant shortness of breath while in the ER Was admitted for acute respiratory failure with hypoxemia and possible sepsis. Given his ongoing nausea and vomiting and history of prior ulcer disease GI is consulted for further evaluation.  Patient seen and examined. According to patient he started noticing worsening nausea around a week ago. patient described as dry heaves with worsening nausea particularly at night. Not sure of any association with the food. Denied any dizziness. Patient also complaining of long-standing constipation but started noticing 4-5 bowel movements with small amount of loose stool since Wednesday. Denied any blood in the stool or black stool. Denied dysphagia or odynophagia. Denied acid reflux. Denied abdominal pain. Denied vomiting. Patient with recurrent UTIs requiring multiple courses of antibiotics recently.  Workup so far included CT abdomen pelvis with contrast on 02/27/2016 which showed some Nonspecific mediastinal lymphadenopathy but otherwise unremarkable. 02/29/2016 Also Negative for Acute Changes but Showed Some Gallstone. CBD of 4 Mm.HIDA scan negative yesterday. LFTs normal. CBC showed mild anemia which could be dilutional As it was normal on admission and during prior Admission.Mild elevated lactic acid which is improving. Hepatitis  panel negative - 02/29/2016  Patient was seen by Dr. Karl Luke in November and December 2016 for constipation which resolved with MiraLAX. Last colonoscopy was in 08/ 2015 which was negative. Patient does have a history of  tubular adenoma requiring colonoscopy every 5 years. History of ulcer disease in 1992  Past Medical History:  Diagnosis Date  . BPH (benign prostatic hyperplasia)    Catherize 1-2 times daily.  . Neuromuscular disorder ([place])   . Perforated ulcer ([place])   . Stroke [facility])    pt states the Plavix is preventative due to the family history of stroke    Past Surgical History:  Procedure Laterality Date  . HERNIA REPAIR    . TONSILLECTOMY    . TRANSURETHRAL RESECTION OF PROSTATE      Prior to Admission medications   Medication Sig Start Date End Date Taking? Authorizing Provider  acyclovir (ZOVIRAX) 400 MG tablet Take 400 mg by mouth daily as needed. Outbreaks   Yes Historical Provider, MD  cholecalciferol (VITAMIN D) 1000 UNITS tablet Take 2,000 Units by mouth daily with lunch.    Yes Historical Provider, MD  clopidogrel (PLAVIX) 75 MG tablet Take 1 tablet (75 mg total) by mouth daily. 03/13/15  Yes Marcial Pacas, MD  gabapentin (NEURONTIN) 800 MG tablet Take 1 tablet (800 mg total) by mouth 3 (three) times daily. 11/27/15  Yes Marcial Pacas, MD  omeprazole (PRILOSEC) 20 MG capsule Take 20 mg by mouth daily.   Yes Historical Provider, MD  trimethoprim (TRIMPEX) 100 MG tablet Take 100 tablets by mouth daily.  02/06/16  Yes Historical Provider, MD  zolpidem (AMBIEN) 10 MG tablet Take 2.5 mg by mouth at bedtime as needed for sleep.    Yes Historical Provider, MD    Scheduled Meds: . cholecalciferol  2,000 Units Oral Q lunch  . enoxaparin (LOVENOX) injection  40 mg Subcutaneous Q24H  . gabapentin  800 mg Oral TID  . pantoprazole  40 mg Oral Daily  . piperacillin-tazobactam (  ZOSYN)  IV  3.375 g Intravenous Q8H  . potassium chloride  40 mEq Oral Once   Continuous Infusions: . sodium chloride     PRN Meds:.acetaminophen **OR** acetaminophen, ondansetron **OR** ondansetron (ZOFRAN) IV, senna-docusate, sodium chloride  Allergies as of 03/02/2016 - Review Complete 03/02/2016  Allergen Reaction Noted   . Other  01/05/2014    Family History  Problem Relation Age of Onset  . Stroke Mother   . Diabetes Mother   . Stroke Father   . Diabetes Father   . Diabetes Sister   . Diabetes Brother     Social History   Social History  . Marital status: Married    Spouse name: Vinnie Level  . Number of children: 2  . Years of education: college   Occupational History  .      retired   Social History Main Topics  . Smoking status: Former Research scientist (life sciences)  . Smokeless tobacco: Never Used  . Alcohol use 0.6 oz/week    1 Glasses of wine per week     Comment: daily  . Drug use: No  . Sexual activity: Not on file   Other Topics Concern  . Not on file   Social History Narrative   Patient is retired, Married for 26 years Vinnie Level).   EducationAdministrator, sports.   Right handed.   Caffeine- five cups daily only tea          Review of Systems: All negative except as stated above in HPI.  Physical Exam: Vital signs: Vitals:   03/03/16 2123 03/04/16 0614  BP: 138/72 140/65  Pulse: 73 69  Resp: 18 18  Temp: 99.3 F (37.4 C) 98.1 F (36.7 C)   Last BM Date: 03/03/16 General:   Alert,  Well-developed, well-nourished, pleasant and cooperative in NAD HEENT-normocephalic/atraumatic EYE:-extraocular movement intact. No scleral icterus. Lungs:  Clear throughout to auscultation.   No wheezes, crackles, or rhonchi. No acute distress. Heart:  Regular rate and rhythm; no murmurs, clicks, rubs,  or gallops. Abdomen: Soft, nontender, nondistended, bowel sounds present Lower extremity-no edema Rectal:  Deferred  GI:  Lab Results:  Recent Labs  03/02/16 0854 03/03/16 0316 03/04/16 0514  WBC 13.8* 10.2 7.1  HGB 14.3 11.3* 12.0*  HCT 42.3 33.4* 36.2*  PLT 186 160 177   BMET  Recent Labs  03/02/16 2000 03/03/16 0316 03/04/16 0514  NA 138 138 141  K 3.8 3.4* 3.6  CL 108 107 111  CO2 24 25 25   GLUCOSE 110* 111* 99  BUN 15 14 10   CREATININE 0.95 1.01 0.89  CALCIUM 7.6* 7.6* 8.0*    LFT  Recent Labs  03/04/16 0514  PROT 5.3*  ALBUMIN 2.6*  AST 17  ALT 20  ALKPHOS 27*  BILITOT 1.2   PT/INR  Recent Labs  03/02/16 1119  LABPROT 13.5  INR 1.03     Studies/Results: Nm Hepato W/eject Fract  Result Date: 03/03/2016 CLINICAL DATA:  Cholelithiasis. Abdominal pain. Elevated liver function test. Sepsis. EXAM: NUCLEAR MEDICINE HEPATOBILIARY IMAGING WITH GALLBLADDER EF TECHNIQUE: Sequential images of the abdomen were obtained out to 60 minutes following intravenous administration of radiopharmaceutical. After slow intravenous infusion of 1.9 micrograms Cholecystokinin, gallbladder ejection fraction was determined. RADIOPHARMACEUTICALS:  7.7 mCi Tc-20m Choletec IV COMPARISON:  None. FINDINGS: Prompt uptake and biliary excretion of activity by the liver is seen. Gallbladder activity is visualized, consistent with patency of cystic duct. Biliary activity passes into small bowel, consistent with patent common bile duct. Calculated gallbladder ejection fraction is  77%. (At 60 min, normal ejection fraction is greater than 40%.) IMPRESSION: Normal hepatobiliary imaging. Patency of both cystic and common bile ducts demonstrated. Normal gallbladder ejection fraction of 77%. Electronically Signed   By: Earle Gell M.D.   On: 03/03/2016 15:09   Dg Chest Portable 1 View  Result Date: 03/02/2016 CLINICAL DATA:  Hypoxia EXAM: PORTABLE CHEST 1 VIEW COMPARISON:  02/27/2016 FINDINGS: Cardiac shadow is within normal limits. The lungs are well aerated bilaterally. Mild interstitial changes are noted without focal confluent infiltrate. No sizable effusion is seen. No bony abnormality is noted. IMPRESSION: Mild interstitial changes without acute abnormality. Electronically Signed   By: Inez Catalina M.D.   On: 03/02/2016 09:18    Impression/Plan: - Recurrent nausea without vomiting. Workup negative so far including CT scan, ultrasound and HIDA scan. Normal CBC and LFTs. ? Etiology/  differential diagnosis would be viral gastroenteritis given recent diarrhea. Less likely to be ulcer disease without abdominal pain.  - History of chronic constipation  - Diarrhea . ? Overflow diarrhea from worsening constipation - History of TIA. On Plavix - History of tubular adenoma. Last colonoscopy in August 2015 was normal. - History of perforated ulcer in 1992  Recommendations ----------------------- - We will get upper GI/small bowel series - Not sure of the last dose of Plavix. Looks like it was discontinued on October 22 - Trial of full liquid diet - Continue PPI - If ongoing symptoms, consider endoscopic evaluation. - GI will follow.    LOS: 2 days   Otis Brace  MD, FACP 03/04/2016, 8:54 AM  Pager [phone] If no answer or after 5 PM call [phone]

## 2016-03-05 ENCOUNTER — Encounter (HOSPITAL_COMMUNITY)
Admission: EM | Disposition: A | Discharge status: Home / Self Care | Payer: Self-pay | Source: Home / Self Care | Attending: Family Medicine

## 2016-03-05 ENCOUNTER — Encounter (HOSPITAL_COMMUNITY): Payer: Self-pay

## 2016-03-05 ENCOUNTER — Inpatient Hospital Stay (HOSPITAL_COMMUNITY): Payer: Medicare Other | Admitting: Anesthesiology

## 2016-03-05 DIAGNOSIS — N319 Neuromuscular dysfunction of bladder, unspecified: Secondary | ICD-10-CM

## 2016-03-05 DIAGNOSIS — K8020 Calculus of gallbladder without cholecystitis without obstruction: Secondary | ICD-10-CM

## 2016-03-05 DIAGNOSIS — A419 Sepsis, unspecified organism: Principal | ICD-10-CM

## 2016-03-05 DIAGNOSIS — R9431 Abnormal electrocardiogram [ECG] [EKG]: Secondary | ICD-10-CM

## 2016-03-05 DIAGNOSIS — G609 Hereditary and idiopathic neuropathy, unspecified: Secondary | ICD-10-CM

## 2016-03-05 DIAGNOSIS — R110 Nausea: Secondary | ICD-10-CM

## 2016-03-05 DIAGNOSIS — R0902 Hypoxemia: Secondary | ICD-10-CM

## 2016-03-05 DIAGNOSIS — J9601 Acute respiratory failure with hypoxia: Secondary | ICD-10-CM

## 2016-03-05 DIAGNOSIS — E876 Hypokalemia: Secondary | ICD-10-CM

## 2016-03-05 HISTORY — PX: ESOPHAGOGASTRODUODENOSCOPY: SHX5428

## 2016-03-05 SURGERY — EGD (ESOPHAGOGASTRODUODENOSCOPY)
Anesthesia: Monitor Anesthesia Care

## 2016-03-05 MED ORDER — PROMETHAZINE HCL 12.5 MG RE SUPP: 12.5000 mg | Freq: Four times a day (QID) | RECTAL | 0 refills | Status: DC | PRN

## 2016-03-05 MED ORDER — OMEPRAZOLE 40 MG PO CPDR: 40.0000 mg | DELAYED_RELEASE_CAPSULE | Freq: Two times a day (BID) | ORAL | 1 refills | Status: DC

## 2016-03-05 MED ORDER — LACTATED RINGERS IV SOLN
INTRAVENOUS | Status: DC
  Administered 2016-03-05: 13:00:00 via INTRAVENOUS

## 2016-03-05 MED ORDER — LACTATED RINGERS IV SOLN
INTRAVENOUS | Status: DC | PRN
  Administered 2016-03-05: 13:00:00 via INTRAVENOUS

## 2016-03-05 MED ORDER — PROPOFOL 500 MG/50ML IV EMUL
INTRAVENOUS | Status: DC | PRN
  Administered 2016-03-05: 75 ug/kg/min via INTRAVENOUS

## 2016-03-05 MED ORDER — PANTOPRAZOLE SODIUM 40 MG PO TBEC: 40.0000 mg | DELAYED_RELEASE_TABLET | Freq: Two times a day (BID) | ORAL | Status: DC

## 2016-03-05 MED ORDER — PROPOFOL 500 MG/50ML IV EMUL
INTRAVENOUS | Status: DC | PRN
  Administered 2016-03-05: 10 mg via INTRAVENOUS
  Administered 2016-03-05: 40 mg via INTRAVENOUS

## 2016-03-05 MED ORDER — PROPOFOL 10 MG/ML IV BOLUS
INTRAVENOUS | Status: AC
  Filled 2016-03-05: qty 20

## 2016-03-05 NOTE — Anesthesia Postprocedure Evaluation (Signed)
Anesthesia Post Note  Patient: David Li  Procedure(s) Performed: Procedure(s) (LRB): ESOPHAGOGASTRODUODENOSCOPY (EGD) (N/A)  Patient location during evaluation: PACU Anesthesia Type: MAC Level of consciousness: awake and alert Pain management: pain level controlled Vital Signs Assessment: post-procedure vital signs reviewed and stable Respiratory status: spontaneous breathing, nonlabored ventilation, respiratory function stable and patient connected to nasal cannula oxygen Cardiovascular status: stable and blood pressure returned to baseline Anesthetic complications: no    Last Vitals:  Vitals:   03/05/16 1350 03/05/16 1400  BP: (!) 141/101 (!) 169/77  Pulse: (!) 55 (!) 56  Resp: 15 14  Temp:      Last Pain:  Vitals:   03/05/16 1345  TempSrc: Oral  PainSc:                  Nylene Inlow J

## 2016-03-05 NOTE — Transfer of Care (Signed)
Immediate Anesthesia Transfer of Care Note  Patient: David Li  Procedure(s) Performed: Procedure(s): ESOPHAGOGASTRODUODENOSCOPY (EGD) (N/A)  Patient Location: PACU  Anesthesia Type:MAC  Level of Consciousness: awake, alert  and oriented  Airway & Oxygen Therapy: Patient Spontanous Breathing and Patient connected to nasal cannula oxygen  Post-op Assessment: Report given to RN and Post -op Vital signs reviewed and stable  Post vital signs: Reviewed and stable  Last Vitals:  Vitals:   03/05/16 0600 03/05/16 1236  BP: (!) 153/72 (!) 180/86  Pulse: 63 62  Resp: 18 14  Temp: 36.5 C 36.6 C    Last Pain:  Vitals:   03/05/16 1236  TempSrc: Oral  PainSc:          Complications: No apparent anesthesia complications

## 2016-03-05 NOTE — Discharge Summary (Signed)
Physician Discharge Summary  David Li WPY:[id number] DOB: 08-25-1940 DOA: 03/02/2016  PCP: Lilian Coma, MD GI: Dr. Cristina Gong  Admit date: 03/02/2016 Discharge date: 03/05/2016  Admitted From: Home  Disposition:  Home  Recommendations for Outpatient Follow-up:  1. Follow up with PCP in 1 weeks.  Follow up with Dr. Cristina Gong in 1 month.  Discharge Condition: STABLE CODE STATUS: FULL  Brief/Interim Summary: David Li a 75 y.o.malewith medical history significant of BPH s/p TURP with urine retention requiring self-cath 1-2x daily, recurrent UTIs on trimethoprim ppx, perforated PUD, idiopathic progressive ascending peripheral neuropathy, possible TIA in the past placed on plavix by Dr Erling Cruz (2008).   He was discharged from the hospital 10/21 after having acute resp failure, nausea/ vomiting. He was treated with Rocephin and Zithromax and discharged on Keflex though he never took this. He states he awoke around 2 AM with severe nausea. He denied abdominal pain and vomiting. Endorses diarrhea without blood/mucous. RUQ ultrasound on 10/20 showed multiple gallstones. He also endorses nonproductive cough for 2 weeks without fever. On arrival he was very short of breath, cyanotic w/SpO2 in low 80%'s.   Lactic acid 4.11, T bili 2.2, Alk ph 35, K 2.8, WBC 13.8. BNP 26.5, Tn neg. 10/19 CTA chest, abd pelvis - Mild nonspecific mediastinal lymphadenopathy. Trace bilateral pleural effusions. CXR 10/22: mild interstitial changes  He was given 60 mg IV Lasix and Phenergan and admitted 10/22 to SDU. Respiratory status has improved, and he was transferred to the floor 10/23. HIDA scan was negative. GI was consulted for persistent nausea, dry heaves, and history of perforated peptic ulcer. UGI series being undertaken 10/24 with consideration for EGD 10/25.   Acute respiratory failure with hypoxia due to sepsis syndrome from unknown source: LA 4.11 > 2.1, PCT 17.51. Presumptive source at  recent admission was UTI vs. pneumonia. No evidence of either at this time. - Flu negative: Started on vanc for healthcare exposure, but was DC w/neg MRSA - Discontinued zosyn.  Off all antibiotics and has been stable.  - D/C IVF, taking good PO.   Cholelithiasis: Without convincing evidence of inflammation on imaging, negative HIDA.  - GI consulted with h/o perforated ulcer on PPI, no NSAIDs: UGI w/ SB today, considering EGD 10/25: no significant findings - see report.   - Follow blood and urine cultures. - surgery team signed off, no indication for surgery found.  History of perforated ulcer: No abd pain. Hgb dropped with IV resuscitation without melena or other bleeding. - Continue PPI. - Monitored CBC, Hg stable at 12.   Chronic BOO s/p TURP: UA completely negative.  - Foley   Hypokalemia/ hypomagnesemia/ prolonged QT - Repleted.  Grade 2 diastolic CHF on echo 05/39: BNP 26.5, trop neg. Received lasix in ED.  - Appears euvolemic on exam. - Holding lasix, consider restarting 10/25  Nausea without vomiting - judicious use of zofran due to prolonged QT  Hereditary and idiopathic peripheral neuropathy: follows with Neuro - Continue home neurontin  DVT prophylaxis: Lovenox Code Status: Full code Family Communication: None at bedside Disposition Plan: Telemetry >> Discharge home.    Consultants:   Gen surgery  GI  Assessment & Plan:  Discharge Diagnoses:  Principal Problem:   Sepsis ([place]) Active Problems:   Hereditary and idiopathic peripheral neuropathy   Acute respiratory failure with hypoxia (HCC)   Nausea without vomiting   Prolonged Q-T interval on ECG   Hypokalemia   Neurogenic bladder   Cholelithiasis  Discharge Instructions  Medication List    TAKE these medications   acyclovir 400 MG tablet Commonly known as:  ZOVIRAX Take 400 mg by mouth daily as needed. Outbreaks   cholecalciferol 1000 units tablet Commonly known as:  VITAMIN  D Take 2,000 Units by mouth daily with lunch.   clopidogrel 75 MG tablet Commonly known as:  PLAVIX Take 1 tablet (75 mg total) by mouth daily.   gabapentin 800 MG tablet Commonly known as:  NEURONTIN Take 1 tablet (800 mg total) by mouth 3 (three) times daily.   omeprazole 40 MG capsule Commonly known as:  PRILOSEC Take 1 capsule (40 mg total) by mouth 2 (two) times daily before a meal. What changed:  medication strength  how much to take  when to take this   trimethoprim 100 MG tablet Commonly known as:  TRIMPEX Take 100 tablets by mouth daily.   zolpidem 10 MG tablet Commonly known as:  AMBIEN Take 2.5 mg by mouth at bedtime as needed for sleep.      Follow-up Information    Cleotis Nipper, MD. Schedule an appointment as soon as possible for a visit in 1 month(s).   Specialty:  Gastroenterology Why:  Hospital Follow Up Contact information: [address] [place] [address] [phone]        Lilian Coma, MD. Schedule an appointment as soon as possible for a visit in 1 week(s).   Specialty:  Family Medicine Why:  Hospital Follow Up Contact information: [address] [place] [address] [phone]          Allergies  Allergen Reactions  . Other     Muscle relaxant medication   Procedures/Studies:  EGD 10/25:  Findings:      The esophagus was normal.      Normal mucosa was found in the stomach.      A mild post-ulcer deformity was found in the duodenal bulb. No active       ulcer or bleeding. Impression:       - Normal esophagus.                           - Normal mucosa was found.                           - Duodenal deformity.                           - No specimens collected.  Moderate Sedation:      MAC by anesthesia     Recommendation:                             - Resume regular diet.                           - Return to GI clinic in 1 month.                           - Continue  present medications.  Dg Chest 2 View  Result Date: 02/27/2016 CLINICAL DATA:  Acute onset of generalized weakness, shortness of breath, nausea, vomiting and diarrhea. Initial encounter. EXAM: CHEST  2 VIEW COMPARISON:  Chest radiograph performed 08/13/2009 FINDINGS: The lungs are well-aerated. Vascular  congestion is noted. Mild peribronchial thickening is seen. There is no evidence of pleural effusion or pneumothorax. The heart is normal in size; the mediastinal contour is within normal limits. No acute osseous abnormalities are seen. IMPRESSION: Vascular congestion noted.  Mild peribronchial thickening seen. Electronically Signed   By: Garald Balding M.D.   On: 02/27/2016 23:20   Ct Angio Chest Pe W And/or Wo Contrast  Result Date: 02/28/2016 CLINICAL DATA:  Nausea, vomiting and diarrhea since early this morning. Weakness and dyspnea. History of perforated gastric ulcer. EXAM: CT ANGIOGRAPHY CHEST CT ABDOMEN AND PELVIS WITH CONTRAST TECHNIQUE: Multidetector CT imaging of the chest was performed using the standard protocol during bolus administration of intravenous contrast. Multiplanar CT image reconstructions and MIPs were obtained to evaluate the vascular anatomy. Multidetector CT imaging of the abdomen and pelvis was performed using the standard protocol during bolus administration of intravenous contrast. CONTRAST:  100 cc IV Isovue 370 COMPARISON:  CT abdomen and pelvis from 12/26/2004 FINDINGS: CTA CHEST FINDINGS Cardiovascular: Satisfactory opacification of the pulmonary arteries to the segmental level. No evidence of pulmonary embolism. Normal heart size. Trace anterior pericardial effusion. No aortic aneurysm or dissection. Go bold right O all Mediastinum/Nodes: There are nonspecific mildly enlarged mediastinal lymph nodes, the largest appears to be left tracheobronchial at 15 mm short axis with 12 mm subcarinal lymph node also present. Smaller paratracheal lymph nodes are noted bilaterally as  well as small prevascular and aortic pulmonary window lymph nodes. Findings could be reactive in etiology. No axillary or supraclavicular lymphadenopathy. Lungs/Pleura: Trace bilateral pleural effusions. Wall no pneumonic consolidation. Left upper lobe anterior subpleural wall atelectasis suspected. Bibasilar dependent atelectasis is noted at each lung base. Musculoskeletal: No chest wall abnormality. No acute or significant osseous findings. Review of the MIP images confirms the above findings. CT ABDOMEN and PELVIS FINDINGS Hepatobiliary: No focal liver abnormality is seen. No gallstones, gallbladder wall thickening, or biliary dilatation. Pancreas: Unremarkable. No pancreatic ductal dilatation or surrounding inflammatory changes. Spleen: Normal in size without focal abnormality. Adrenals/Urinary Tract: No enhancing mass lesion in either kidney. No obstructive uropathy. With adrenal glands. Nonspecific mild perinephric fat stranding. Mild circumferential wall thickening of the bladder possibly related to cystitis. Stomach/Bowel: No bowel obstruction or acute inflammation. Nodular densities involving multiple colonic haustra likely represent adherent stool, less likely polyps. Vascular/Lymphatic: No significant vascular findings are present. No enlarged abdominal or pelvic lymph nodes. Reproductive: TURP defect of the prostate Other: No ascites Musculoskeletal: Transitional lumbosacral anatomy. There appears to be L5 spondylolysis without spondylolisthesis. Review of the MIP images confirms the above findings. IMPRESSION: No pulmonary embolus. Mild nonspecific mediastinal lymphadenopathy. Trace bilateral pleural effusions. No acute intra-abdominal pelvic abnormalities. TURP defect of the prostate. L5 spondylolysis. Electronically Signed   By: Ashley Royalty M.D.   On: 02/28/2016 01:05   Ct Abdomen Pelvis W Contrast  Result Date: 02/28/2016 CLINICAL DATA:  Nausea, vomiting and diarrhea since early this morning.  Weakness and dyspnea. History of perforated gastric ulcer. EXAM: CT ANGIOGRAPHY CHEST CT ABDOMEN AND PELVIS WITH CONTRAST TECHNIQUE: Multidetector CT imaging of the chest was performed using the standard protocol during bolus administration of intravenous contrast. Multiplanar CT image reconstructions and MIPs were obtained to evaluate the vascular anatomy. Multidetector CT imaging of the abdomen and pelvis was performed using the standard protocol during bolus administration of intravenous contrast. CONTRAST:  100 cc IV Isovue 370 COMPARISON:  CT abdomen and pelvis from 12/26/2004 FINDINGS: CTA CHEST FINDINGS Cardiovascular: Satisfactory opacification of the pulmonary arteries  to the segmental level. No evidence of pulmonary embolism. Normal heart size. Trace anterior pericardial effusion. No aortic aneurysm or dissection. Go bold right O all Mediastinum/Nodes: There are nonspecific mildly enlarged mediastinal lymph nodes, the largest appears to be left tracheobronchial at 15 mm short axis with 12 mm subcarinal lymph node also present. Smaller paratracheal lymph nodes are noted bilaterally as well as small prevascular and aortic pulmonary window lymph nodes. Findings could be reactive in etiology. No axillary or supraclavicular lymphadenopathy. Lungs/Pleura: Trace bilateral pleural effusions. Wall no pneumonic consolidation. Left upper lobe anterior subpleural wall atelectasis suspected. Bibasilar dependent atelectasis is noted at each lung base. Musculoskeletal: No chest wall abnormality. No acute or significant osseous findings. Review of the MIP images confirms the above findings. CT ABDOMEN and PELVIS FINDINGS Hepatobiliary: No focal liver abnormality is seen. No gallstones, gallbladder wall thickening, or biliary dilatation. Pancreas: Unremarkable. No pancreatic ductal dilatation or surrounding inflammatory changes. Spleen: Normal in size without focal abnormality. Adrenals/Urinary Tract: No enhancing mass  lesion in either kidney. No obstructive uropathy. With adrenal glands. Nonspecific mild perinephric fat stranding. Mild circumferential wall thickening of the bladder possibly related to cystitis. Stomach/Bowel: No bowel obstruction or acute inflammation. Nodular densities involving multiple colonic haustra likely represent adherent stool, less likely polyps. Vascular/Lymphatic: No significant vascular findings are present. No enlarged abdominal or pelvic lymph nodes. Reproductive: TURP defect of the prostate Other: No ascites Musculoskeletal: Transitional lumbosacral anatomy. There appears to be L5 spondylolysis without spondylolisthesis. Review of the MIP images confirms the above findings. IMPRESSION: No pulmonary embolus. Mild nonspecific mediastinal lymphadenopathy. Trace bilateral pleural effusions. No acute intra-abdominal pelvic abnormalities. TURP defect of the prostate. L5 spondylolysis. Electronically Signed   By: Ashley Royalty M.D.   On: 02/28/2016 01:05   Nm Hepato W/eject Fract  Result Date: 03/03/2016 CLINICAL DATA:  Cholelithiasis. Abdominal pain. Elevated liver function test. Sepsis. EXAM: NUCLEAR MEDICINE HEPATOBILIARY IMAGING WITH GALLBLADDER EF TECHNIQUE: Sequential images of the abdomen were obtained out to 60 minutes following intravenous administration of radiopharmaceutical. After slow intravenous infusion of 1.9 micrograms Cholecystokinin, gallbladder ejection fraction was determined. RADIOPHARMACEUTICALS:  7.7 mCi Tc-69mCholetec IV COMPARISON:  None. FINDINGS: Prompt uptake and biliary excretion of activity by the liver is seen. Gallbladder activity is visualized, consistent with patency of cystic duct. Biliary activity passes into small bowel, consistent with patent common bile duct. Calculated gallbladder ejection fraction is 77%. (At 60 min, normal ejection fraction is greater than 40%.) IMPRESSION: Normal hepatobiliary imaging. Patency of both cystic and common bile ducts  demonstrated. Normal gallbladder ejection fraction of 77%. Electronically Signed   By: JEarle GellM.D.   On: 03/03/2016 15:09   Dg Chest Portable 1 View  Result Date: 03/02/2016 CLINICAL DATA:  Hypoxia EXAM: PORTABLE CHEST 1 VIEW COMPARISON:  02/27/2016 FINDINGS: Cardiac shadow is within normal limits. The lungs are well aerated bilaterally. Mild interstitial changes are noted without focal confluent infiltrate. No sizable effusion is seen. No bony abnormality is noted. IMPRESSION: Mild interstitial changes without acute abnormality. Electronically Signed   By: MInez CatalinaM.D.   On: 03/02/2016 09:18   Dg UDuanne LimerickW/small Bowel  Result Date: 03/04/2016 CLINICAL DATA:  Nausea and vomiting.  Prior ulcer disease. EXAM: UPPER GI SERIES WITH SMALL BOWEL FOLLOW-THROUGH FLUOROSCOPY TIME:  Fluoroscopy Time:  4 minutes, 49 seconds Radiation Exposure Index (if provided by the fluoroscopic device): 107.7 mGy Number of Acquired Spot Images: 27 TECHNIQUE: Combined double contrast and single contrast upper GI series using effervescent crystals, thick  barium, and thin barium. Subsequently, serial images of the small bowel were obtained including spot views of the terminal ileum. COMPARISON:  02/27/2016 CT scan FINDINGS: Initial KUB demonstrates lumbar spondylosis an minimal scarring or atelectasis in the left lower lobe. The pharyngeal phase of swallowing demonstrates laryngeal penetration down to the level of the cords. Cervical spondylosis noted. Mild secondary contractions in the esophagus on double contrast imaging. Distal esophageal folds do not appear thickened. Primary peristaltic waves in the esophagus were preserved. No gastric ulcer identified. There is fold thickening and mild irregularity along the distal portion of the duodenal bulb, but without a well-defined duodenal bulb ulcer. No ulceration in the gastric antrum identified. Transit time through the small bowel is only about 10-15 minutes. Normal small bowel  folds in caliber. Normal terminal ileum on compression fluoroscopy. The fill the appendix which appears normal. Compression fluoroscopy throughout the small bowel demonstrates no significant abnormal filling defect or other specific small bowel abnormality. IMPRESSION: 1. Mild fold thickening in the duodenal bulb could be from prior scarring or from current duodenitis. I do not see a current ulcer in the stomach, duodenum, or esophagus. 2. The small bowel appears otherwise unremarkable. 3. Cervical and lumbar spondylosis. 4. Laryngeal penetration down to the level of the cords with swallowing. The patient did not since the penetration. 5. Somewhat quit transit time through the small bowel of only tended 15 minutes. This might simply be incidental. Electronically Signed   By: Van Clines M.D.   On: 03/04/2016 14:58   US Abdomen Limited Ruq  Result Date: 02/29/2016 CLINICAL DATA:  Elevated liver enzymes EXAM: US ABDOMEN LIMITED - RIGHT UPPER QUADRANT COMPARISON:  CT abdomen and pelvis February 27, 2016 FINDINGS: Gallbladder: Within the gallbladder, there are echogenic foci which move and shadow consistent with cholelithiasis. Largest individual gallstone measures 1.2 cm in length. There is no gallbladder wall thickening or pericholecystic fluid. No sonographic Murphy sign noted by sonographer. Common bile duct: Diameter: 4 mm. There is no intrahepatic or extrahepatic biliary duct dilatation. Liver: No focal lesion identified. Within normal limits in parenchymal echogenicity. IMPRESSION: Cholelithiasis.  Study otherwise unremarkable. Electronically Signed   By: Lowella Grip III M.D.   On: 02/29/2016 09:02     Subjective: Pt without complaints.  Tolerated EGD well.   Discharge Exam: Vitals:   03/05/16 1350 03/05/16 1400  BP: (!) 141/101 (!) 169/77  Pulse: (!) 55 (!) 56  Resp: 15 14  Temp:     Vitals:   03/05/16 1236 03/05/16 1345 03/05/16 1350 03/05/16 1400  BP: (!) 180/86 138/63 (!)  141/101 (!) 169/77  Pulse: 62 (!) 55 (!) 55 (!) 56  Resp: _0 Temp: 97.9 F (36.6 C) 97.7 F (36.5 C)    TempSrc: Oral Oral    SpO2: 94% 97% 98% 98%  Weight: 93 kg (205 lb)     Height: _1  (1.753 m)       General: Pt is alert, awake, not in acute distress Cardiovascular: RRR, S1/S2 +, no rubs, no gallops Respiratory: CTA bilaterally, no wheezing, no rhonchi Abdominal: Soft, NT, ND, bowel sounds + Extremities: no edema, no cyanosis  The results of significant diagnostics from this hospitalization (including imaging, microbiology, ancillary and laboratory) are listed below for reference.     Microbiology: Recent Results (from the past 240 hour(s))  Blood culture (routine x 2)     Status: None   Collection Time: 02/27/16 11:59 PM  Result Value Ref Range Status  Specimen Description BLOOD LEFT ANTECUBITAL  Final   Special Requests IN PEDIATRIC BOTTLE  5CC AND ANA 5CC  Final   Culture   Final    NO GROWTH 5 DAYS Performed at [facility]    Report Status 03/04/2016 FINAL  Final  Urine culture     Status: None   Collection Time: 02/28/16 12:50 AM  Result Value Ref Range Status   Specimen Description URINE, RANDOM  Final   Special Requests NONE  Final   Culture   Final    NO GROWTH 1 DAY Performed at [facility]    Report Status 02/29/2016 FINAL  Final  Blood culture (routine x 2)     Status: None   Collection Time: 02/28/16  2:14 AM  Result Value Ref Range Status   Specimen Description BLOOD LEFT ANTECUBITAL  Final   Special Requests IN PEDIATRIC BOTTLE 5CC  Final   Culture   Final    NO GROWTH 5 DAYS Performed at [facility]    Report Status 03/04/2016 FINAL  Final  Culture, Urine     Status: None   Collection Time: 03/02/16 10:42 AM  Result Value Ref Range Status   Specimen Description URINE, CATHETERIZED  Final   Special Requests NONE  Final   Culture NO GROWTH Performed at [facility]   Final   Report Status  03/04/2016 FINAL  Final  Culture, blood (x 2)     Status: None (Preliminary result)   Collection Time: 03/02/16 11:19 AM  Result Value Ref Range Status   Specimen Description BLOOD RIGHT FOREARM  Final   Special Requests BOTTLES DRAWN AEROBIC AND ANAEROBIC 5CC  Final   Culture   Final    NO GROWTH 2 DAYS Performed at [facility]    Report Status PENDING  Incomplete  Culture, blood (x 2)     Status: None (Preliminary result)   Collection Time: 03/02/16 11:19 AM  Result Value Ref Range Status   Specimen Description BLOOD RIGHT HAND  Final   Special Requests BOTTLES DRAWN AEROBIC AND ANAEROBIC 5CC  Final   Culture   Final    NO GROWTH 2 DAYS Performed at [facility]    Report Status PENDING  Incomplete  MRSA PCR Screening     Status: None   Collection Time: 03/02/16  2:04 PM  Result Value Ref Range Status   MRSA by PCR NEGATIVE NEGATIVE Final    Comment:        The GeneXpert MRSA Assay (FDA approved for NASAL specimens only), is one component of a comprehensive MRSA colonization surveillance program. It is not intended to diagnose MRSA infection nor to guide or monitor treatment for MRSA infections.      Labs: BNP (last 3 results)  Recent Labs  02/28/16 0721 03/02/16 0855  BNP 57.9 99.2   Basic Metabolic Panel:  Recent Labs Lab 02/29/16 0502 03/01/16 0536 03/02/16 0854 03/02/16 1119 03/02/16 2000 03/03/16 0316 03/04/16 0514  NA 143 143 137  --  138 138 141  K 3.6 4.0 2.8*  --  3.8 3.4* 3.6  CL 111 110 102  --  108 107 111  CO2 _0 --  _1 GLUCOSE 96 88 136*  --  110* 111* 99  BUN _2 --  _3 CREATININE 0.75 0.75 1.09  --  0.95 1.01 0.89  CALCIUM 8.2* 8.2* 8.7*  --  7.6*  7.6* 8.0*  MG 1.9 1.9  --  1.4* 2.1  --  2.0   Liver Function Tests:  Recent Labs Lab 02/28/16 0721 02/29/16 0502 03/01/16 0536 03/02/16 0854 03/04/16 0514  AST 48* 38 30 35 17  ALT _0 ALKPHOS 36* 30* 32* 35* 27*  BILITOT  1.8* 1.1 1.1 2.2* 1.2  PROT 6.1* 5.8* 5.5* 6.7 5.3*  ALBUMIN 3.0* 2.9* 2.9* 3.6 2.6*    Recent Labs Lab 02/27/16 2212 03/02/16 0854  LIPASE 13 19   No results for input(s): AMMONIA in the last 168 hours. CBC:  Recent Labs Lab 02/28/16 0721 02/29/16 0502 03/02/16 0854 03/03/16 0316 03/04/16 0514  WBC 10.0 5.9 13.8* 10.2 7.1  NEUTROABS 8.7*  --  12.9*  --   --   HGB 13.1 12.3* 14.3 11.3* 12.0*  HCT 39.2 37.0* 42.3 33.4* 36.2*  MCV 95.8 96.1 94.8 96.3 96.3  PLT 148* 154 186 160 177   Cardiac Enzymes:  Recent Labs Lab 02/28/16 0721 02/29/16 0502  TROPONINI 0.07* 0.04*   BNP: Invalid input(s): POCBNP CBG: No results for input(s): GLUCAP in the last 168 hours. D-Dimer No results for input(s): DDIMER in the last 72 hours. Hgb A1c No results for input(s): HGBA1C in the last 72 hours. Lipid Profile No results for input(s): CHOL, HDL, LDLCALC, TRIG, CHOLHDL, LDLDIRECT in the last 72 hours. Thyroid function studies No results for input(s): TSH, T4TOTAL, T3FREE, THYROIDAB in the last 72 hours.  Invalid input(s): FREET3 Anemia work up No results for input(s): VITAMINB12, FOLATE, FERRITIN, TIBC, IRON, RETICCTPCT in the last 72 hours. Urinalysis    Component Value Date/Time   COLORURINE YELLOW 03/02/2016 [place] 03/02/2016 1042   LABSPEC 1.009 03/02/2016 1042   PHURINE 6.0 03/02/2016 1042   GLUCOSEU NEGATIVE 03/02/2016 1042   HGBUR NEGATIVE 03/02/2016 1042   BILIRUBINUR NEGATIVE 03/02/2016 1042   KETONESUR NEGATIVE 03/02/2016 1042   PROTEINUR NEGATIVE 03/02/2016 1042   UROBILINOGEN 0.2 02/17/2007 0752   NITRITE NEGATIVE 03/02/2016 1042   LEUKOCYTESUR NEGATIVE 03/02/2016 1042   Sepsis Labs Invalid input(s): PROCALCITONIN,  WBC,  LACTICIDVEN Microbiology Recent Results (from the past 240 hour(s))  Blood culture (routine x 2)     Status: None   Collection Time: 02/27/16 11:59 PM  Result Value Ref Range Status   Specimen Description BLOOD LEFT  ANTECUBITAL  Final   Special Requests IN PEDIATRIC BOTTLE  5CC AND ANA 5CC  Final   Culture   Final    NO GROWTH 5 DAYS Performed at [facility]    Report Status 03/04/2016 FINAL  Final  Urine culture     Status: None   Collection Time: 02/28/16 12:50 AM  Result Value Ref Range Status   Specimen Description URINE, RANDOM  Final   Special Requests NONE  Final   Culture   Final    NO GROWTH 1 DAY Performed at [facility]    Report Status 02/29/2016 FINAL  Final  Blood culture (routine x 2)     Status: None   Collection Time: 02/28/16  2:14 AM  Result Value Ref Range Status   Specimen Description BLOOD LEFT ANTECUBITAL  Final   Special Requests IN PEDIATRIC BOTTLE 5CC  Final   Culture   Final    NO GROWTH 5 DAYS Performed at [facility]    Report Status 03/04/2016 FINAL  Final  Culture, Urine     Status: None   Collection Time:  03/02/16 10:42 AM  Result Value Ref Range Status   Specimen Description URINE, CATHETERIZED  Final   Special Requests NONE  Final   Culture NO GROWTH Performed at [facility]   Final   Report Status 03/04/2016 FINAL  Final  Culture, blood (x 2)     Status: None (Preliminary result)   Collection Time: 03/02/16 11:19 AM  Result Value Ref Range Status   Specimen Description BLOOD RIGHT FOREARM  Final   Special Requests BOTTLES DRAWN AEROBIC AND ANAEROBIC 5CC  Final   Culture   Final    NO GROWTH 2 DAYS Performed at [facility]    Report Status PENDING  Incomplete  Culture, blood (x 2)     Status: None (Preliminary result)   Collection Time: 03/02/16 11:19 AM  Result Value Ref Range Status   Specimen Description BLOOD RIGHT HAND  Final   Special Requests BOTTLES DRAWN AEROBIC AND ANAEROBIC 5CC  Final   Culture   Final    NO GROWTH 2 DAYS Performed at [facility]    Report Status PENDING  Incomplete  MRSA PCR Screening     Status: None   Collection Time: 03/02/16  2:04 PM  Result Value Ref Range  Status   MRSA by PCR NEGATIVE NEGATIVE Final    Comment:        The GeneXpert MRSA Assay (FDA approved for NASAL specimens only), is one component of a comprehensive MRSA colonization surveillance program. It is not intended to diagnose MRSA infection nor to guide or monitor treatment for MRSA infections.    Time coordinating discharge: 27 minutes  SIGNED:  Irwin Brakeman, MD  [facility] 03/05/2016, 2:06 PM Pager   If 7PM-7AM, please contact night-coverage [email or web address] Password [email or web address]

## 2016-03-05 NOTE — Anesthesia Preprocedure Evaluation (Addendum)
Anesthesia Evaluation  Patient identified by MRN, date of birth, ID band Patient awake  General Assessment Comment:Assessment/Plan Principal Problem:   Sepsis (HCC)/   Acute respiratory failure with hypoxia  -- tachycardia, tachypnea, leukocytosis, lactic acidosis - after sepsis protocol started, he developed a temp of 103.5  Reviewed: Allergy & Precautions, NPO status , Patient's Chart, lab work & pertinent test results  Airway Mallampati: II  TM Distance: >3 FB Neck ROM: Full    Dental no notable dental hx.    Pulmonary pneumonia, resolved, former smoker,    Pulmonary exam normal breath sounds clear to auscultation       Cardiovascular negative cardio ROS Normal cardiovascular exam Rhythm:Regular Rate:Normal  ECHO 02-29-16:Study Conclusions  - Left ventricle: The cavity size was normal. Wall thickness was   normal. Systolic function was normal. The estimated ejection   fraction was in the range of 60% to 65%. Wall motion was normal;   there were no regional wall motion abnormalities. Doppler   parameters are consistent with pseuonormal left ventricular   relaxation (grade 2 diastolic dysfunction). The E/e&' ratio is   between 8-15, suggesting indeterminate LV filling pressure. - Mitral valve: Mildly thickened leaflets . There was trivial   regurgitation. - Left atrium: The atrium was normal in size. - Inferior vena cava: The vessel was normal in size. The   respirophasic diameter changes were in the normal range (>= 50%),   consistent with normal central venous pressure.  Impressions:  - LVEF 60-65%, normal wall thickness, normal wall motion, Grade 2   DD with indeterminate (but likely elevated) LV filling pressure,   trivial MR, normal LA size, normal IVC.   Neuro/Psych Idiopathic neuropathy feet greater than hands.  Neuromuscular disease CVA, No Residual Symptoms negative psych ROS   GI/Hepatic Neg liver ROS,  PUD,   Endo/Other  negative endocrine ROS  Renal/GU negative Renal ROS  negative genitourinary   Musculoskeletal negative musculoskeletal ROS (+)   Abdominal (+) + obese,   Peds negative pediatric ROS (+)  Hematology negative hematology ROS (+)   Anesthesia Other Findings   Reproductive/Obstetrics negative OB ROS                           Anesthesia Physical Anesthesia Plan  ASA: III  Anesthesia Plan: MAC   Post-op Pain Management:    Induction: Intravenous  Airway Management Planned: Natural Airway  Additional Equipment:   Intra-op Plan:   Post-operative Plan:   Informed Consent: I have reviewed the patients History and Physical, chart, labs and discussed the procedure including the risks, benefits and alternatives for the proposed anesthesia with the patient or authorized representative who has indicated his/her understanding and acceptance.   Dental advisory given  Plan Discussed with: CRNA  Anesthesia Plan Comments:         Anesthesia Quick Evaluation

## 2016-03-05 NOTE — Op Note (Signed)
[facility] Patient Name: David Li Procedure Date: 03/05/2016 MRN: [id number] Attending MD: Nancy Fetter Dr., MD Date of Birth: 15-Jun-1940 CSN: [id number] Age: 75 Admit Type: Inpatient Procedure:                Upper GI endoscopy Indications:              Abnormal UGI series, Nausea Providers:                Jeneen Rinks L. Tevita Gomer Dr., MD, Hilma Favors, RN, Alfonso Patten, Technician, Rosario Adie, CRNA Referring MD:              Medicines:                Monitored Anesthesia Care Complications:            No immediate complications. Estimated Blood Loss:     Estimated blood loss: none. Procedure:                Pre-Anesthesia Assessment:                           - Prior to the procedure, a History and Physical                            was performed, and patient medications and                            allergies were reviewed. The patient's tolerance of                            previous anesthesia was also reviewed. The risks                            and benefits of the procedure and the sedation                            options and risks were discussed with the patient.                            All questions were answered, and informed consent                            was obtained. Prior Anticoagulants: The patient has                            taken no previous anticoagulant or antiplatelet                            agents. ASA Grade Assessment: III - A patient with                            severe systemic disease. After reviewing the risks  and benefits, the patient was deemed in                            satisfactory condition to undergo the procedure.                           After obtaining informed consent, the endoscope was                            passed under direct vision. Throughout the                            procedure, the patient's blood pressure, pulse, and                   oxygen saturations were monitored continuously. The                            [id number]) scope was introduced through the                            mouth, and advanced to the second part of duodenum.                            The upper GI endoscopy was accomplished without                            difficulty. The patient tolerated the procedure                            well. Scope In: Scope Out: Findings:      The esophagus was normal.      Normal mucosa was found in the stomach.      A mild post-ulcer deformity was found in the duodenal bulb. No active       ulcer or bleeding. Impression:               - Normal esophagus.                           - Normal mucosa was found.                           - Duodenal deformity.                           - No specimens collected. Moderate Sedation:      MAC by anesthesia      MAC by anesthesia Recommendation:           - Return patient to hospital ward for ongoing care.                           - Resume regular diet.                           - Return to GI clinic in 1 month.                           -  Continue present medications. Procedure Code(s):        --- Professional ---                           [phone], Esophagogastroduodenoscopy, flexible,                            transoral; diagnostic, including collection of                            specimen(s) by brushing or washing, when performed                            (separate procedure) Diagnosis Code(s):        --- Professional ---                           K31.89, Other diseases of stomach and duodenum                           R11.0, Nausea                           R93.3, Abnormal findings on diagnostic imaging of                            other parts of digestive tract CPT copyright 2016 [employer]. All rights reserved. The codes documented in this report are preliminary and upon coder review may  be revised to meet current  compliance requirements. Nancy Fetter Dr., MD 03/05/2016 1:48:07 PM This report has been signed electronically. Number of Addenda: 0

## 2016-03-05 NOTE — Plan of Care (Signed)
Problem: Education: Goal: Knowledge of [place] [facility] Education information/materials will improve Outcome: Completed/Met Date Met: 03/05/16 Pt denied questions or concerns. poc reviewed

## 2016-03-05 NOTE — H&P (View-Only) (Signed)
[facility] Gastroenterology Progress Note  David Li 75 y.o. 1940/07/31  CC:  Follow-up for recurrent nausea   Subjective: Patient's nausea improving. Denied abdominal pain. Denied diarrhea or constipation. Tolerated liquid diet yesterday without any issues.  ROS : Negative for vomiting, abdominal pain, diarrhea   Objective: Vital signs in last 24 hours: Vitals:   03/04/16 2141 03/05/16 0600  BP: (!) 147/67 (!) 153/72  Pulse: 62 63  Resp: 18 18  Temp: 98 F (36.7 C) 97.7 F (36.5 C)    Physical Exam:  General:  Alert, cooperative, no distress, appears stated age  Head:  Normocephalic, without obvious abnormality, atraumatic  Eyes:  , EOM's intact,   Lungs:   Clear to auscultation bilaterally, respirations unlabored  Heart:  Regular rate and rhythm, S1, S2 normal  Abdomen:   Soft, non-tender, bowel sounds active all four quadrants,  no masses,   Extremities: Extremities normal, atraumatic, no  edema       Lab Results:  Recent Labs  03/02/16 2000 03/03/16 0316 03/04/16 0514  NA 138 138 141  K 3.8 3.4* 3.6  CL 108 107 111  CO2 24 25 25   GLUCOSE 110* 111* 99  BUN 15 14 10   CREATININE 0.95 1.01 0.89  CALCIUM 7.6* 7.6* 8.0*  MG 2.1  --  2.0    Recent Labs  03/04/16 0514  AST 17  ALT 20  ALKPHOS 27*  BILITOT 1.2  PROT 5.3*  ALBUMIN 2.6*    Recent Labs  03/03/16 0316 03/04/16 0514  WBC 10.2 7.1  HGB 11.3* 12.0*  HCT 33.4* 36.2*  MCV 96.3 96.3  PLT 160 177    Recent Labs  03/02/16 1119  LABPROT 13.5  INR 1.03      Assessment/Plan: - Recurrent nausea without vomiting. Workup negative so far including CT scan, ultrasound and HIDA scan. Normal CBC and LFTs. ? Etiology/ differential diagnosis would be viral gastroenteritis given recent diarrhea. Less likely to be ulcer disease without abdominal pain. Resolved.  - History of chronic constipation  - Diarrhea . Resolved.  - History of TIA. On Plavix. On hold since 03/02/2016  - History of  tubular adenoma. Last colonoscopy in August 2015 was normal. - History of perforated ulcer in 1992  Recommendations ----------------------- - Upper GI/small bowel series report reviewed. Mild fold thickening in the duodenal bulb could be from prior surgery no definite ulcer seen.  - EGD for further evaluation. Plavix on hold since 03/02/2016  -  Continue twice a day PPI.  Phenergan suppository as requested by patient - F/up with Dr. Cristina Gong in 2-4 weeks after discharge.    Otis Brace MD, [place] 03/05/2016, 9:23 AM  Pager [phone]  If no answer or after 5 PM call [phone]

## 2016-03-05 NOTE — Progress Notes (Addendum)
[facility] Gastroenterology Progress Note  David Li 75 y.o. 1940-12-06  CC:  Follow-up for recurrent nausea   Subjective: Patient's nausea improving. Denied abdominal pain. Denied diarrhea or constipation. Tolerated liquid diet yesterday without any issues.  ROS : Negative for vomiting, abdominal pain, diarrhea   Objective: Vital signs in last 24 hours: Vitals:   03/04/16 2141 03/05/16 0600  BP: (!) 147/67 (!) 153/72  Pulse: 62 63  Resp: 18 18  Temp: 98 F (36.7 C) 97.7 F (36.5 C)    Physical Exam:  General:  Alert, cooperative, no distress, appears stated age  Head:  Normocephalic, without obvious abnormality, atraumatic  Eyes:  , EOM's intact,   Lungs:   Clear to auscultation bilaterally, respirations unlabored  Heart:  Regular rate and rhythm, S1, S2 normal  Abdomen:   Soft, non-tender, bowel sounds active all four quadrants,  no masses,   Extremities: Extremities normal, atraumatic, no  edema       Lab Results:  Recent Labs  03/02/16 2000 03/03/16 0316 03/04/16 0514  NA 138 138 141  K 3.8 3.4* 3.6  CL 108 107 111  CO2 24 25 25   GLUCOSE 110* 111* 99  BUN 15 14 10   CREATININE 0.95 1.01 0.89  CALCIUM 7.6* 7.6* 8.0*  MG 2.1  --  2.0    Recent Labs  03/04/16 0514  AST 17  ALT 20  ALKPHOS 27*  BILITOT 1.2  PROT 5.3*  ALBUMIN 2.6*    Recent Labs  03/03/16 0316 03/04/16 0514  WBC 10.2 7.1  HGB 11.3* 12.0*  HCT 33.4* 36.2*  MCV 96.3 96.3  PLT 160 177    Recent Labs  03/02/16 1119  LABPROT 13.5  INR 1.03      Assessment/Plan: - Recurrent nausea without vomiting. Workup negative so far including CT scan, ultrasound and HIDA scan. Normal CBC and LFTs. ? Etiology/ differential diagnosis would be viral gastroenteritis given recent diarrhea. Less likely to be ulcer disease without abdominal pain. Resolved.  - History of chronic constipation  - Diarrhea . Resolved.  - History of TIA. On Plavix. On hold since 03/02/2016  - History of  tubular adenoma. Last colonoscopy in August 2015 was normal. - History of perforated ulcer in 1992  Recommendations ----------------------- - Upper GI/small bowel series report reviewed. Mild fold thickening in the duodenal bulb could be from prior surgery no definite ulcer seen.  - EGD for further evaluation. Plavix on hold since 03/02/2016  -  Continue twice a day PPI.  Phenergan suppository as requested by patient - F/up with Dr. Cristina Gong in 2-4 weeks after discharge.    Otis Brace MD, [place] 03/05/2016, 9:23 AM  Pager [phone]  If no answer or after 5 PM call [phone]

## 2016-03-05 NOTE — Interval H&P Note (Signed)
History and Physical Interval Note:  03/05/2016 1:13 PM  David Li  has presented today for surgery, with the diagnosis of Nausea, H/O Ulcer disease. Abnormal Upper gi series  The various methods of treatment have been discussed with the patient and family. After consideration of risks, benefits and other options for treatment, the patient has consented to  Procedure(s): ESOPHAGOGASTRODUODENOSCOPY (EGD) (N/A) as a surgical intervention .  The patient's history has been reviewed, patient examined, no change in status, stable for surgery.  I have reviewed the patient's chart and labs.  Questions were answered to the patient's satisfaction.     Corretta Munce JR,Dicie Edelen L

## 2016-03-05 NOTE — Care Management Note (Signed)
Case Management Note  Patient Details  Name: David Li MRN: [id number] Date of Birth: 1941/03/30  Subjective/Objective:                    Action/Plan:d/c home no needs or orders.   Expected Discharge Date:                  Expected Discharge Plan:  Home/Self Care  In-House Referral:     Discharge planning Services  CM Consult  Post Acute Care Choice:    Choice offered to:     DME Arranged:    DME Agency:     HH Arranged:    [facility] Agency:     Status of Service:  Completed, signed off  If discussed at [employer] of Stay Meetings, dates discussed:    Additional Comments:  Dessa Phi, RN 03/05/2016, 2:40 PM

## 2016-03-05 NOTE — Progress Notes (Signed)
Patient was A&Ox4, ambulatory without assist at time of discharge. Instructions reviewed, questions concerns denied.

## 2016-03-05 NOTE — Discharge Instructions (Signed)
Nausea and Vomiting  Nausea means you feel sick to your stomach. Throwing up (vomiting) is a reflex where stomach contents come out of your mouth.  HOME CARE   · Take medicine as told by your doctor.  · Do not force yourself to eat. However, you do need to drink fluids.  · If you feel like eating, eat a normal diet as told by your doctor.    Eat rice, wheat, potatoes, bread, lean meats, yogurt, fruits, and vegetables.    Avoid high-fat foods.  · Drink enough fluids to keep your pee (urine) clear or pale yellow.  · Ask your doctor how to replace body fluid losses (rehydrate). Signs of body fluid loss (dehydration) include:    Feeling very thirsty.    Dry lips and mouth.    Feeling dizzy.    Dark pee.    Peeing less than normal.    Feeling confused.    Fast breathing or heart rate.  GET HELP RIGHT AWAY IF:   · You have blood in your throw up.  · You have black or bloody poop (stool).  · You have a bad headache or stiff neck.  · You feel confused.  · You have bad belly (abdominal) pain.  · You have chest pain or trouble breathing.  · You do not pee at least once every 8 hours.  · You have cold, clammy skin.  · You keep throwing up after 24 to 48 hours.  · You have a fever.  MAKE SURE YOU:   · Understand these instructions.  · Will watch your condition.  · Will get help right away if you are not doing well or get worse.     This information is not intended to replace advice given to you by your health care provider. Make sure you discuss any questions you have with your health care provider.     Document Released: 10/15/2007 Document Revised: 07/21/2011 Document Reviewed: 09/27/2010  Elsevier Interactive Patient Education ©2016 Elsevier Inc.

## 2016-03-06 ENCOUNTER — Encounter (HOSPITAL_COMMUNITY): Payer: Self-pay | Admitting: Gastroenterology

## 2016-03-07 LAB — CULTURE, BLOOD (ROUTINE X 2)
CULTURE: NO GROWTH
CULTURE: NO GROWTH

## 2016-03-20 ENCOUNTER — Ambulatory Visit: Payer: Self-pay | Admitting: General Surgery

## 2016-04-01 ENCOUNTER — Encounter (HOSPITAL_BASED_OUTPATIENT_CLINIC_OR_DEPARTMENT_OTHER): Payer: Self-pay | Admitting: *Deleted

## 2016-04-01 NOTE — Progress Notes (Addendum)
To [facility] at 12 noon-CBCw/Diff,CMET on arrival,Ekg in chart-will take gabapentin,prilosec in am.Instructed Npo solids after Mn-clear liquids(no dairy,pulp)untill 0800,then Npo all liquids.Hibiclens shower evening prior and am of surgery.

## 2016-04-07 ENCOUNTER — Ambulatory Visit (HOSPITAL_BASED_OUTPATIENT_CLINIC_OR_DEPARTMENT_OTHER)
Admission: RE | Admit: 2016-04-07 | Discharge: 2016-04-07 | Disposition: A | Discharge status: Home / Self Care | Payer: Medicare Other | Source: Ambulatory Visit | Attending: General Surgery | Admitting: General Surgery

## 2016-04-07 ENCOUNTER — Ambulatory Visit (HOSPITAL_BASED_OUTPATIENT_CLINIC_OR_DEPARTMENT_OTHER): Payer: Medicare Other | Admitting: Anesthesiology

## 2016-04-07 ENCOUNTER — Encounter (HOSPITAL_BASED_OUTPATIENT_CLINIC_OR_DEPARTMENT_OTHER)
Admission: RE | Disposition: A | Discharge status: Home / Self Care | Payer: Self-pay | Source: Ambulatory Visit | Attending: General Surgery

## 2016-04-07 ENCOUNTER — Encounter (HOSPITAL_BASED_OUTPATIENT_CLINIC_OR_DEPARTMENT_OTHER): Payer: Self-pay | Admitting: *Deleted

## 2016-04-07 DIAGNOSIS — Z8673 Personal history of transient ischemic attack (TIA), and cerebral infarction without residual deficits: Secondary | ICD-10-CM | POA: Diagnosis not present

## 2016-04-07 DIAGNOSIS — K8010 Calculus of gallbladder with chronic cholecystitis without obstruction: Secondary | ICD-10-CM | POA: Insufficient documentation

## 2016-04-07 DIAGNOSIS — Z87891 Personal history of nicotine dependence: Secondary | ICD-10-CM | POA: Insufficient documentation

## 2016-04-07 DIAGNOSIS — Z7902 Long term (current) use of antithrombotics/antiplatelets: Secondary | ICD-10-CM | POA: Diagnosis not present

## 2016-04-07 DIAGNOSIS — G609 Hereditary and idiopathic neuropathy, unspecified: Secondary | ICD-10-CM | POA: Diagnosis not present

## 2016-04-07 DIAGNOSIS — Z823 Family history of stroke: Secondary | ICD-10-CM | POA: Diagnosis not present

## 2016-04-07 DIAGNOSIS — Z79899 Other long term (current) drug therapy: Secondary | ICD-10-CM | POA: Insufficient documentation

## 2016-04-07 DIAGNOSIS — K219 Gastro-esophageal reflux disease without esophagitis: Secondary | ICD-10-CM | POA: Insufficient documentation

## 2016-04-07 HISTORY — PX: CHOLECYSTECTOMY: SHX55

## 2016-04-07 LAB — COMPREHENSIVE METABOLIC PANEL
ALK PHOS: 42 U/L (ref 38–126)
ALT: 15 U/L — AB (ref 17–63)
AST: 22 U/L (ref 15–41)
Albumin: 3.9 g/dL (ref 3.5–5.0)
Anion gap: 7 (ref 5–15)
BILIRUBIN TOTAL: 1.6 mg/dL — AB (ref 0.3–1.2)
BUN: 8 mg/dL (ref 6–20)
CALCIUM: 9 mg/dL (ref 8.9–10.3)
CHLORIDE: 107 mmol/L (ref 101–111)
CO2: 25 mmol/L (ref 22–32)
CREATININE: 0.87 mg/dL (ref 0.61–1.24)
Glucose, Bld: 97 mg/dL (ref 65–99)
Potassium: 3.9 mmol/L (ref 3.5–5.1)
Sodium: 139 mmol/L (ref 135–145)
TOTAL PROTEIN: 7.1 g/dL (ref 6.5–8.1)

## 2016-04-07 LAB — CBC WITH DIFFERENTIAL/PLATELET
BASOS ABS: 0 10*3/uL (ref 0.0–0.1)
Basophils Relative: 1 %
EOS PCT: 4 %
Eosinophils Absolute: 0.3 10*3/uL (ref 0.0–0.7)
HEMATOCRIT: 40.8 % (ref 39.0–52.0)
Hemoglobin: 13.7 g/dL (ref 13.0–17.0)
LYMPHS ABS: 1.7 10*3/uL (ref 0.7–4.0)
LYMPHS PCT: 29 %
MCH: 31.7 pg (ref 26.0–34.0)
MCHC: 33.6 g/dL (ref 30.0–36.0)
MCV: 94.4 fL (ref 78.0–100.0)
Monocytes Absolute: 0.4 10*3/uL (ref 0.1–1.0)
Monocytes Relative: 8 %
NEUTROS ABS: 3.4 10*3/uL (ref 1.7–7.7)
Neutrophils Relative %: 58 %
PLATELETS: 214 10*3/uL (ref 150–400)
RBC: 4.32 MIL/uL (ref 4.22–5.81)
RDW: 13.1 % (ref 11.5–15.5)
WBC: 5.9 10*3/uL (ref 4.0–10.5)

## 2016-04-07 SURGERY — LAPAROSCOPIC CHOLECYSTECTOMY
Anesthesia: Monitor Anesthesia Care | Site: Abdomen

## 2016-04-07 MED ORDER — OXYCODONE HCL 5 MG/5ML PO SOLN
5.0000 mg | Freq: Once | ORAL | Status: AC | PRN
  Filled 2016-04-07: qty 5

## 2016-04-07 MED ORDER — LACTATED RINGERS IV SOLN
INTRAVENOUS | Status: DC
  Filled 2016-04-07: qty 1000

## 2016-04-07 MED ORDER — GLYCOPYRROLATE 0.2 MG/ML IV SOSY
PREFILLED_SYRINGE | INTRAVENOUS | Status: AC
  Filled 2016-04-07: qty 3

## 2016-04-07 MED ORDER — MEPERIDINE HCL 25 MG/ML IJ SOLN
6.2500 mg | INTRAMUSCULAR | Status: DC | PRN
  Filled 2016-04-07: qty 1

## 2016-04-07 MED ORDER — PROPOFOL 10 MG/ML IV BOLUS
INTRAVENOUS | Status: AC
  Filled 2016-04-07: qty 20

## 2016-04-07 MED ORDER — HYDROCODONE-ACETAMINOPHEN 5-325 MG PO TABS: 1.0000 | ORAL_TABLET | Freq: Four times a day (QID) | ORAL | 0 refills | Status: DC | PRN

## 2016-04-07 MED ORDER — DEXAMETHASONE SODIUM PHOSPHATE 4 MG/ML IJ SOLN
8.0000 mg | Freq: Once | INTRAMUSCULAR | Status: DC | PRN
  Filled 2016-04-07: qty 2

## 2016-04-07 MED ORDER — ACETAMINOPHEN 500 MG PO TABS
1000.0000 mg | ORAL_TABLET | ORAL | Status: AC
  Administered 2016-04-07: 1000 mg via ORAL
  Filled 2016-04-07: qty 2

## 2016-04-07 MED ORDER — PHENYLEPHRINE HCL 10 MG/ML IJ SOLN
INTRAMUSCULAR | Status: DC | PRN
  Administered 2016-04-07 (×2): 40 ug via INTRAVENOUS

## 2016-04-07 MED ORDER — ONDANSETRON HCL 4 MG/2ML IJ SOLN
INTRAMUSCULAR | Status: AC
  Filled 2016-04-07: qty 2

## 2016-04-07 MED ORDER — PROPOFOL 10 MG/ML IV BOLUS
INTRAVENOUS | Status: DC | PRN
  Administered 2016-04-07: 150 mg via INTRAVENOUS

## 2016-04-07 MED ORDER — CELECOXIB 200 MG PO CAPS
ORAL_CAPSULE | ORAL | Status: AC
  Filled 2016-04-07: qty 2

## 2016-04-07 MED ORDER — ACETAMINOPHEN 325 MG PO TABS
325.0000 mg | ORAL_TABLET | ORAL | Status: DC | PRN
  Filled 2016-04-07: qty 2

## 2016-04-07 MED ORDER — CEFOTETAN DISODIUM-DEXTROSE 2-2.08 GM-% IV SOLR
INTRAVENOUS | Status: AC
  Filled 2016-04-07: qty 50

## 2016-04-07 MED ORDER — IBUPROFEN 800 MG PO TABS: 800.0000 mg | ORAL_TABLET | Freq: Three times a day (TID) | ORAL | 0 refills | Status: DC | PRN

## 2016-04-07 MED ORDER — KETOROLAC TROMETHAMINE 30 MG/ML IJ SOLN
30.0000 mg | Freq: Once | INTRAMUSCULAR | Status: DC
  Filled 2016-04-07: qty 1

## 2016-04-07 MED ORDER — EPHEDRINE 5 MG/ML INJ
INTRAVENOUS | Status: AC
  Filled 2016-04-07: qty 10

## 2016-04-07 MED ORDER — ROCURONIUM BROMIDE 100 MG/10ML IV SOLN
INTRAVENOUS | Status: DC | PRN
  Administered 2016-04-07: 30 mg via INTRAVENOUS

## 2016-04-07 MED ORDER — CEFOTETAN DISODIUM 2 G IJ SOLR
2.0000 g | INTRAMUSCULAR | Status: AC
  Administered 2016-04-07: 2 g via INTRAVENOUS
  Filled 2016-04-07: qty 2

## 2016-04-07 MED ORDER — FENTANYL CITRATE (PF) 100 MCG/2ML IJ SOLN
INTRAMUSCULAR | Status: AC
  Filled 2016-04-07: qty 2

## 2016-04-07 MED ORDER — OXYCODONE HCL 5 MG PO TABS
5.0000 mg | ORAL_TABLET | Freq: Once | ORAL | Status: AC | PRN
  Administered 2016-04-07: 5 mg via ORAL
  Filled 2016-04-07: qty 1

## 2016-04-07 MED ORDER — EPHEDRINE SULFATE 50 MG/ML IJ SOLN
INTRAMUSCULAR | Status: DC | PRN
  Administered 2016-04-07 (×5): 10 mg via INTRAVENOUS

## 2016-04-07 MED ORDER — CHLORHEXIDINE GLUCONATE CLOTH 2 % EX PADS
6.0000 | MEDICATED_PAD | Freq: Once | CUTANEOUS | Status: DC
  Filled 2016-04-07: qty 6

## 2016-04-07 MED ORDER — MIDAZOLAM HCL 2 MG/2ML IJ SOLN
INTRAMUSCULAR | Status: DC | PRN
  Administered 2016-04-07: 1 mg via INTRAVENOUS

## 2016-04-07 MED ORDER — GLYCOPYRROLATE 0.2 MG/ML IJ SOLN
INTRAMUSCULAR | Status: DC | PRN
  Administered 2016-04-07: 0.1 mg via INTRAVENOUS

## 2016-04-07 MED ORDER — GABAPENTIN 300 MG PO CAPS
300.0000 mg | ORAL_CAPSULE | ORAL | Status: DC
  Filled 2016-04-07: qty 1

## 2016-04-07 MED ORDER — FENTANYL CITRATE (PF) 100 MCG/2ML IJ SOLN
25.0000 ug | INTRAMUSCULAR | Status: DC | PRN
  Filled 2016-04-07: qty 1

## 2016-04-07 MED ORDER — DEXAMETHASONE SODIUM PHOSPHATE 10 MG/ML IJ SOLN
INTRAMUSCULAR | Status: AC
  Filled 2016-04-07: qty 1

## 2016-04-07 MED ORDER — SODIUM CHLORIDE 0.9 % IR SOLN
Status: DC | PRN
  Administered 2016-04-07: 1000 mL

## 2016-04-07 MED ORDER — BUPIVACAINE-EPINEPHRINE 0.25% -1:200000 IJ SOLN
INTRAMUSCULAR | Status: DC | PRN
  Administered 2016-04-07: 15 mL

## 2016-04-07 MED ORDER — ACETAMINOPHEN 160 MG/5ML PO SOLN
325.0000 mg | ORAL | Status: DC | PRN
  Filled 2016-04-07: qty 20.3

## 2016-04-07 MED ORDER — LACTATED RINGERS IV SOLN
INTRAVENOUS | Status: DC
  Administered 2016-04-07 (×2): via INTRAVENOUS
  Filled 2016-04-07: qty 1000

## 2016-04-07 MED ORDER — PHENYLEPHRINE 40 MCG/ML (10ML) SYRINGE FOR IV PUSH (FOR BLOOD PRESSURE SUPPORT)
PREFILLED_SYRINGE | INTRAVENOUS | Status: AC
  Filled 2016-04-07: qty 10

## 2016-04-07 MED ORDER — OXYCODONE HCL 5 MG PO TABS
ORAL_TABLET | ORAL | Status: AC
  Filled 2016-04-07: qty 1

## 2016-04-07 MED ORDER — ACETAMINOPHEN 500 MG PO TABS
ORAL_TABLET | ORAL | Status: AC
  Filled 2016-04-07: qty 2

## 2016-04-07 MED ORDER — CELECOXIB 400 MG PO CAPS
400.0000 mg | ORAL_CAPSULE | ORAL | Status: AC
  Administered 2016-04-07: 400 mg via ORAL
  Filled 2016-04-07: qty 1

## 2016-04-07 MED ORDER — LIDOCAINE HCL (CARDIAC) 20 MG/ML IV SOLN
INTRAVENOUS | Status: DC | PRN
  Administered 2016-04-07: 50 mg via INTRAVENOUS

## 2016-04-07 MED ORDER — FENTANYL CITRATE (PF) 100 MCG/2ML IJ SOLN
INTRAMUSCULAR | Status: DC | PRN
  Administered 2016-04-07 (×3): 50 ug via INTRAVENOUS

## 2016-04-07 MED ORDER — SUGAMMADEX SODIUM 200 MG/2ML IV SOLN
INTRAVENOUS | Status: DC | PRN
  Administered 2016-04-07: 200 mg via INTRAVENOUS

## 2016-04-07 MED ORDER — MIDAZOLAM HCL 2 MG/2ML IJ SOLN
INTRAMUSCULAR | Status: AC
  Filled 2016-04-07: qty 2

## 2016-04-07 MED FILL — IBUPROFEN 800 MG TABLET: 800 | 10 days supply | Qty: 30 | Fill #0

## 2016-04-07 MED FILL — HYDROCODON-APAP 5-325: 5-325 | 4 days supply | Qty: 30 | Fill #0

## 2016-04-07 SURGICAL SUPPLY — 52 items
APPLIER CLIP ROT 10 11.4 M/L (STAPLE)
APR CLP MED LRG 11.4X10 (STAPLE)
BAG SPEC RTRVL 10 TROC 200 (ENDOMECHANICALS) ×1
BLADE SURG 11 STRL SS (BLADE) ×3 IMPLANT
CATH CHOLANG 76X19 KUMAR (CATHETERS) IMPLANT
CHLORAPREP W/TINT 26ML (MISCELLANEOUS) ×3 IMPLANT
CLIP APPLIE ROT 10 11.4 M/L (STAPLE) IMPLANT
CLIP LIGATING HEM O LOK PURPLE (MISCELLANEOUS) ×3 IMPLANT
CLIP LIGATING HEMO LOK XL GOLD (MISCELLANEOUS) IMPLANT
COVER BACK TABLE 60X90IN (DRAPES) ×3 IMPLANT
COVER MAYO STAND STRL (DRAPES) ×3 IMPLANT
CUTTER FLEX LINEAR 45M (STAPLE) IMPLANT
DECANTER SPIKE VIAL GLASS SM (MISCELLANEOUS) ×3 IMPLANT
DEVICE PMI PUNCTURE CLOSURE (MISCELLANEOUS) IMPLANT
DRAIN CHANNEL 19F RND (DRAIN) IMPLANT
DRAPE C-ARM 42X72 X-RAY (DRAPES) IMPLANT
DRAPE LAPAROSCOPIC ABDOMINAL (DRAPES) ×3 IMPLANT
DRAPE UTILITY XL STRL (DRAPES) ×3 IMPLANT
ELECT REM PT RETURN 9FT ADLT (ELECTROSURGICAL) ×3
ELECTRODE REM PT RTRN 9FT ADLT (ELECTROSURGICAL) ×1 IMPLANT
EVACUATOR SILICONE 100CC (DRAIN) IMPLANT
GLOVE BIOGEL PI IND STRL 7.0 (GLOVE) ×1 IMPLANT
GLOVE BIOGEL PI INDICATOR 7.0 (GLOVE) ×2
GLOVE SURG SS PI 7.0 STRL IVOR (GLOVE) ×3 IMPLANT
GOWN STRL REUS W/TWL LRG LVL3 (GOWN DISPOSABLE) ×3 IMPLANT
HEMOSTAT SNOW SURGICEL 2X4 (HEMOSTASIS) IMPLANT
IV NS 1000ML (IV SOLUTION) ×3
IV NS 1000ML BAXH (IV SOLUTION) ×1 IMPLANT
LIQUID BAND (GAUZE/BANDAGES/DRESSINGS) ×3 IMPLANT
NEEDLE HYPO 22GX1.5 SAFETY (NEEDLE) ×3 IMPLANT
PACK BASIN DAY SURGERY FS (CUSTOM PROCEDURE TRAY) ×3 IMPLANT
POUCH RETRIEVAL ECOSAC 10 (ENDOMECHANICALS) ×1 IMPLANT
POUCH RETRIEVAL ECOSAC 10MM (ENDOMECHANICALS) ×2
RELOAD 45 VASCULAR/THIN (ENDOMECHANICALS) IMPLANT
RELOAD STAPLE 45 2.5 WHT GRN (ENDOMECHANICALS) IMPLANT
RELOAD STAPLE 45 3.5 BLU ETS (ENDOMECHANICALS) IMPLANT
RELOAD STAPLE TA45 3.5 REG BLU (ENDOMECHANICALS) IMPLANT
SCISSORS LAP 5X35 DISP (ENDOMECHANICALS) IMPLANT
SET IRRIG TUBING LAPAROSCOPIC (IRRIGATION / IRRIGATOR) IMPLANT
SHEARS HARMONIC ACE PLUS 36CM (ENDOMECHANICALS) IMPLANT
SOLUTION ANTI FOG 6CC (MISCELLANEOUS) ×3 IMPLANT
STOPCOCK 4 WAY LG BORE MALE ST (IV SETS) IMPLANT
SUT ETHILON 2 0 PS N (SUTURE) IMPLANT
SUT MNCRL AB 4-0 PS2 18 (SUTURE) ×3 IMPLANT
SUT VICRYL 0 ENDOLOOP (SUTURE) IMPLANT
SUT VICRYL 0 UR6 27IN ABS (SUTURE) IMPLANT
SYR 20CC LL (SYRINGE) IMPLANT
SYR CONTROL 10ML LL (SYRINGE) ×3 IMPLANT
TOWEL OR 17X24 6PK STRL BLUE (TOWEL DISPOSABLE) ×6 IMPLANT
TROCAR BLADELESS OPT 5 100 (ENDOMECHANICALS) ×9 IMPLANT
TROCAR XCEL 12X100 BLDLESS (ENDOMECHANICALS) ×3 IMPLANT
TUBING INSUF HEATED (TUBING) ×3 IMPLANT

## 2016-04-07 NOTE — Anesthesia Procedure Notes (Signed)
Procedure Name: Intubation Date/Time: 04/07/2016 2:16 PM Performed by: Rayvon Char Pre-anesthesia Checklist: Patient identified, Emergency Drugs available and Timeout performed Patient Re-evaluated:Patient Re-evaluated prior to inductionOxygen Delivery Method: Circle system utilized Preoxygenation: Pre-oxygenation with 100% oxygen Intubation Type: IV induction Ventilation: Mask ventilation without difficulty Laryngoscope Size: Miller and 2 Grade View: Grade I Tube type: Oral Tube size: 7.0 mm Number of attempts: 1 Airway Equipment and Method: Stylet Placement Confirmation: ETT inserted through vocal cords under direct vision,  positive ETCO2 and CO2 detector Secured at: 24 cm Tube secured with: Tape Dental Injury: Teeth and Oropharynx as per pre-operative assessment

## 2016-04-07 NOTE — Discharge Instructions (Signed)
HOME CARE INSTRUCTIONS - LAPAROSCOPY  Wound Care: The bandaids or dressing which are placed over the skin openings may be removed the day after surgery. The incision should be kept clean and dry. The stitches do not need to be removed. Should the incision become sore, red, and swollen after the first week, check with your doctor.  Personal Hygiene: Shower the day after your procedure.   Activity: Do not drive or operate any equipment today. The effects of the anesthesia are still present and drowsiness may result. Rest today, not necessarily flat bed rest, just take it easy. You may resume your normal activity in one to three days or as instructed by your physician.  Sexual Activity: You resume sexual activity as indicated by your physician_________.   Diet: Eat a light diet as desired this evening. You may resume a regular diet tomorrow.  Return to Work: Two to three days or as indicated by your doctor.  Expectations. Mild abdominal discomfort or tenderness is not unusual and some shoulder pain may also be noted which can be relieved by lying flat in pain.  Call Your Doctor If these Occur:  Persistent or heavy bleeding at incision site       Redness or swelling around incision       Elevation of temperature greater than 100.4 degrees F    Post Anesthesia Home Care Instructions  Activity: Get plenty of rest for the remainder of the day. A responsible adult should stay with you for 24 hours following the procedure.  For the next 24 hours, DO NOT: -Drive a car -Paediatric nurse -Drink alcoholic beverages -Take any medication unless instructed by your physician -Make any legal decisions or sign important papers.  Meals: Start with liquid foods such as gelatin or soup. Progress to regular foods as tolerated. Avoid greasy, spicy, heavy foods. If nausea and/or vomiting occur, drink only clear liquids until the nausea and/or vomiting subsides. Call your physician if vomiting  continues.  Special Instructions/Symptoms: Your throat may feel dry or sore from the anesthesia or the breathing tube placed in your throat during surgery. If this causes discomfort, gargle with warm salt water. The discomfort should disappear within 24 hours.  If you had a scopolamine patch placed behind your ear for the management of post- operative nausea and/or vomiting:  1. The medication in the patch is effective for 72 hours, after which it should be removed.  Wrap patch in a tissue and discard in the trash. Wash hands thoroughly with soap and water. 2. You may remove the patch earlier than 72 hours if you experience unpleasant side effects which may include dry mouth, dizziness or visual disturbances. 3. Avoid touching the patch. Wash your hands with soap and water after contact with the patch.

## 2016-04-07 NOTE — Op Note (Signed)
PATIENT:  David Li  75 y.o. male  PRE-OPERATIVE DIAGNOSIS:  CHRONIC CALCULOUS CHOLECYSTITIS  POST-OPERATIVE DIAGNOSIS:  * No post-op diagnosis entered *  PROCEDURE:  Procedure(s): LAPAROSCOPIC CHOLECYSTECTOMY   SURGEON:  Surgeon(s): Arta Bruce Sayde Lish, MD  ASSISTANT: none  ANESTHESIA:   local and general  Indications for procedure: KEHINDE PAREKH is a 75 y.o. male with symptoms of Abdominal pain, Nausea and Vomiting consistent with gallbladder disease, Confirmed by Ultrasound.  Description of procedure: The patient was brought into the operative suite, placed supine. Anesthesia was administered with endotracheal tube. Patient was strapped in place and foot board was secured. All pressure points were offloaded by foam padding. The patient was prepped and draped in the usual sterile fashion.  A small incision was made in the right subcostal area. A 48mm trocar was inserted into the peritoneal cavity with optical entry. Pneumoperitoneum was applied with high flow low pressure. 1 22mm trocar was placed in the lower epigastrium superior to the umbilical mesh. 1 28mm trocar was placed in the RUQ. A 44mm trocar was placed in the subxiphoid space. All trocars sites were first anesthesized with 0.25% marcaine with epinephrine in the subcutaneous and preperitoneal layers. Next the patient was placed in reverse trendelenberg.   The gallbladder was retracted cephalad and lateral. The peritoneum was reflected off the infundibulum working lateral to medial. The cystic duct and cystic artery were identified and further dissection revealed a critical view. The cystic duct and cystic artery were doubly clipped and ligated.   The gallbladder was removed off the liver bed with cautery. The Gallbladder was placed in a specimen bag. The gallbladder fossa was irrigated and hemostasis was applied with cautery. The gallbladder was removed via the 72mm trocar. No dilation was required for removal, therefore  no fascial closure was performed. Pneumoperitoneum was removed, all trocar were removed. All incisions were closed with 4-0 monocryl subcuticular stitch. The patient woke from anesthesia and was brought to PACU in stable condition. All counts were correct  Findings: mildly inflamed gallbladder. Umbilical mesh in good location  Specimen: gallbladder  Blood loss: Total I/O In: 1000 [I.V.:1000] Out: -  ml  Local anesthesia: 20 ml 0.25% marcaine with epinephrine  Complications: none  PLAN OF CARE: Discharge to home after PACU  PATIENT DISPOSITION:  PACU - hemodynamically stable.  Gurney Maxin, M.D. General, Bariatric, & Minimally Invasive Surgery [facility]

## 2016-04-07 NOTE — Anesthesia Postprocedure Evaluation (Signed)
Anesthesia Post Note  Patient: David Li  Procedure(s) Performed: Procedure(s) (LRB): LAPAROSCOPIC CHOLECYSTECTOMY (N/A)  Patient location during evaluation: PACU Anesthesia Type: General Level of consciousness: sedated Pain management: pain level controlled Vital Signs Assessment: post-procedure vital signs reviewed and stable Respiratory status: spontaneous breathing and respiratory function stable Cardiovascular status: stable Anesthetic complications: no    Last Vitals:  Vitals:   04/07/16 1216 04/07/16 1510  BP: (!) 150/70 139/67  Pulse: 70 78  Resp: 16 10  Temp: 37 C (!) 35.9 C    Last Pain:  Vitals:   04/07/16 1545  TempSrc:   PainSc: 3                  Arshad Oberholzer DANIEL

## 2016-04-07 NOTE — H&P (Signed)
David Li is an 75 y.o. male.   Chief Complaint: abdominal pain HPI: 75 yo male with intermittent abdominal pain. He was in the hospital for pain last month but had no signs of infection, EGD showed minimal disease and he was discharged home; Since then he has continued to have the pain persistently. He also has some nausea and vomiting.   Past Medical History:  Diagnosis Date  . BPH (benign prostatic hyperplasia)    self caths 1-2 times daily.  . Neuromuscular disorder ([place])    idiopathic,both feet,progressing to legs  . Perforated ulcer ([place]) 1982  . Stroke [facility])    states the Plavix is preventative due to the strong family history of stroke    Past Surgical History:  Procedure Laterality Date  . ESOPHAGOGASTRODUODENOSCOPY N/A 03/05/2016   Procedure: ESOPHAGOGASTRODUODENOSCOPY (EGD);  Surgeon: Laurence Spates, MD;  Location: [facility];  Service: Endoscopy;  Laterality: N/A;  . HERNIA REPAIR  2012  . TONSILLECTOMY    . TRANSURETHRAL RESECTION OF PROSTATE  2006    Family History  Problem Relation Age of Onset  . Stroke Mother   . Diabetes Mother   . Stroke Father   . Diabetes Father   . Diabetes Sister   . Diabetes Brother    Social History:  reports that he has quit smoking. He has never used smokeless tobacco. He reports that he drinks about 0.6 oz of alcohol per week . He reports that he does not use drugs.  Allergies:  Allergies  Allergen Reactions  . Other     Muscle relaxant medication-unsure of the name    Medications Prior to Admission  Medication Sig Dispense Refill  . ALPRAZolam (XANAX) 0.25 MG tablet Take 0.25 mg by mouth at bedtime.    . cholecalciferol (VITAMIN D) 1000 UNITS tablet Take 2,000 Units by mouth daily with lunch.     . clopidogrel (PLAVIX) 75 MG tablet Take 1 tablet (75 mg total) by mouth daily. 90 tablet 3  . gabapentin (NEURONTIN) 800 MG tablet Take 1 tablet (800 mg total) by mouth 3 (three) times daily. 270 tablet 4  . omeprazole  (PRILOSEC) 40 MG capsule Take 1 capsule (40 mg total) by mouth 2 (two) times daily before a meal. 60 capsule 1  . promethazine (PHENERGAN) 12.5 MG suppository Place 1 suppository (12.5 mg total) rectally every 6 (six) hours as needed for nausea, vomiting or refractory nausea / vomiting. 12 each 0  . thiamine (VITAMIN B-1) 100 MG tablet Take 100 mg by mouth daily.    Marland Kitchen trimethoprim (TRIMPEX) 100 MG tablet Take 100 tablets by mouth daily.     Marland Kitchen zolpidem (AMBIEN) 10 MG tablet Take 2.5 mg by mouth at bedtime as needed for sleep.     Marland Kitchen acyclovir (ZOVIRAX) 400 MG tablet Take 400 mg by mouth daily as needed. Outbreaks      Results for orders placed or performed during the hospital encounter of 04/07/16 (from the past 48 hour(s))  CBC WITH DIFFERENTIAL     Status: None   Collection Time: 04/07/16 12:41 PM  Result Value Ref Range   WBC 5.9 4.0 - 10.5 K/uL   RBC 4.32 4.22 - 5.81 MIL/uL   Hemoglobin 13.7 13.0 - 17.0 g/dL   HCT 40.8 39.0 - 52.0 %   MCV 94.4 78.0 - 100.0 fL   MCH 31.7 26.0 - 34.0 pg   MCHC 33.6 30.0 - 36.0 g/dL   RDW 13.1 11.5 - 15.5 %  Platelets 214 150 - 400 K/uL   Neutrophils Relative % 58 %   Neutro Abs 3.4 1.7 - 7.7 K/uL   Lymphocytes Relative 29 %   Lymphs Abs 1.7 0.7 - 4.0 K/uL   Monocytes Relative 8 %   Monocytes Absolute 0.4 0.1 - 1.0 K/uL   Eosinophils Relative 4 %   Eosinophils Absolute 0.3 0.0 - 0.7 K/uL   Basophils Relative 1 %   Basophils Absolute 0.0 0.0 - 0.1 K/uL  Comprehensive metabolic panel     Status: Abnormal   Collection Time: 04/07/16 12:41 PM  Result Value Ref Range   Sodium 139 135 - 145 mmol/L   Potassium 3.9 3.5 - 5.1 mmol/L   Chloride 107 101 - 111 mmol/L   CO2 25 22 - 32 mmol/L   Glucose, Bld 97 65 - 99 mg/dL   BUN 8 6 - 20 mg/dL   Creatinine, Ser 0.87 0.61 - 1.24 mg/dL   Calcium 9.0 8.9 - 10.3 mg/dL   Total Protein 7.1 6.5 - 8.1 g/dL   Albumin 3.9 3.5 - 5.0 g/dL   AST 22 15 - 41 U/L   ALT 15 (L) 17 - 63 U/L   Alkaline Phosphatase 42 38  - 126 U/L   Total Bilirubin 1.6 (H) 0.3 - 1.2 mg/dL   GFR calc non Af Amer >60 >60 mL/min   GFR calc Af Amer >60 >60 mL/min    Comment: (NOTE) The eGFR has been calculated using the CKD EPI equation. This calculation has not been validated in all clinical situations. eGFR's persistently <60 mL/min signify possible Chronic Kidney Disease.    Anion gap 7 5 - 15   No results found.  Review of Systems  Constitutional: Negative for chills and fever.  HENT: Negative for hearing loss.   Eyes: Negative for blurred vision and double vision.  Respiratory: Negative for cough and hemoptysis.   Cardiovascular: Negative for chest pain and palpitations.  Gastrointestinal: Negative for abdominal pain, nausea and vomiting.  Genitourinary: Negative for dysuria and urgency.  Musculoskeletal: Negative for myalgias and neck pain.  Skin: Negative for itching and rash.  Neurological: Negative for dizziness, tingling and headaches.  Endo/Heme/Allergies: Does not bruise/bleed easily.  Psychiatric/Behavioral: Negative for depression and suicidal ideas.    Blood pressure (!) 150/70, pulse 70, temperature 98.6 F (37 C), temperature source Oral, resp. rate 16, height 5' 9.5" (1.765 m), weight 88 kg (194 lb), SpO2 96 %. Physical Exam  Vitals reviewed. Constitutional: He is oriented to person, place, and time. He appears well-developed and well-nourished.  HENT:  Head: Normocephalic and atraumatic.  Eyes: Conjunctivae and EOM are normal. Pupils are equal, round, and reactive to light.  Neck: Normal range of motion. Neck supple.  Cardiovascular: Normal rate and regular rhythm.   Respiratory: Effort normal and breath sounds normal.  GI: Soft. Bowel sounds are normal. He exhibits no distension. There is no tenderness.  Musculoskeletal: Normal range of motion.  Neurological: He is alert and oriented to person, place, and time.  Skin: Skin is warm and dry.  Psychiatric: He has a normal mood and affect. His  behavior is normal.     Assessment/Plan 75 yo male with biliary colic. He has held his plavix. -lap chole as outpatient  Mickeal Skinner, MD 04/07/2016, 1:57 PM

## 2016-04-07 NOTE — Anesthesia Preprocedure Evaluation (Addendum)
Anesthesia Evaluation  Patient identified by MRN, date of birth, ID band Patient awake  General Assessment Comment:Assessment/Plan Principal Problem:   Sepsis (HCC)/   Acute respiratory failure with hypoxia  -- tachycardia, tachypnea, leukocytosis, lactic acidosis - after sepsis protocol started, he developed a temp of 103.5  Reviewed: Allergy & Precautions, NPO status , Patient's Chart, lab work & pertinent test results  Airway Mallampati: I  TM Distance: >3 FB Neck ROM: Full    Dental no notable dental hx.    Pulmonary pneumonia, resolved, former smoker,    Pulmonary exam normal        Cardiovascular negative cardio ROS Normal cardiovascular exam  ECHO 02-29-16:Study Conclusions  - Left ventricle: The cavity size was normal. Wall thickness was   normal. Systolic function was normal. The estimated ejection   fraction was in the range of 60% to 65%. Wall motion was normal;   there were no regional wall motion abnormalities. Doppler   parameters are consistent with pseuonormal left ventricular   relaxation (grade 2 diastolic dysfunction). The E/e&' ratio is   between 8-15, suggesting indeterminate LV filling pressure. - Mitral valve: Mildly thickened leaflets . There was trivial   regurgitation. - Left atrium: The atrium was normal in size. - Inferior vena cava: The vessel was normal in size. The   respirophasic diameter changes were in the normal range (>= 50%),   consistent with normal central venous pressure.  Impressions:  - LVEF 60-65%, normal wall thickness, normal wall motion, Grade 2   DD with indeterminate (but likely elevated) LV filling pressure,   trivial MR, normal LA size, normal IVC.   Neuro/Psych Idiopathic neuropathy feet greater than hands.  Neuromuscular disease CVA, No Residual Symptoms negative psych ROS   GI/Hepatic Neg liver ROS, PUD, GERD  Medicated and Controlled,  Endo/Other  negative  endocrine ROS  Renal/GU negative Renal ROS  negative genitourinary   Musculoskeletal negative musculoskeletal ROS (+)   Abdominal (+) - obese,   Peds negative pediatric ROS (+)  Hematology negative hematology ROS (+)   Anesthesia Other Findings   Reproductive/Obstetrics negative OB ROS                             Anesthesia Physical  Anesthesia Plan  ASA: II  Anesthesia Plan: General   Post-op Pain Management:    Induction: Intravenous  Airway Management Planned: Oral ETT  Additional Equipment:   Intra-op Plan:   Post-operative Plan: Extubation in OR  Informed Consent: I have reviewed the patients History and Physical, chart, labs and discussed the procedure including the risks, benefits and alternatives for the proposed anesthesia with the patient or authorized representative who has indicated his/her understanding and acceptance.   Dental advisory given  Plan Discussed with: CRNA and Surgeon  Anesthesia Plan Comments:        Anesthesia Quick Evaluation

## 2016-04-07 NOTE — Transfer of Care (Signed)
Immediate Anesthesia Transfer of Care Note  Patient: David Li  Procedure(s) Performed: Procedure(s): LAPAROSCOPIC CHOLECYSTECTOMY (N/A)  Patient Location: PACU  Anesthesia Type:General  Level of Consciousness: sedated and responds to stimulation  Airway & Oxygen Therapy: Patient Spontanous Breathing and Patient connected to nasal cannula oxygen  Post-op Assessment: Report given to RN  Post vital signs: Reviewed and stable  Last Vitals:  Vitals:   04/07/16 1216 04/07/16 1510  BP: (!) 150/70 (P) 139/67  Pulse: 70 (P) 78  Resp: 16 (P) 10  Temp: 37 C (!) (P) 35.9 C    Last Pain:  Vitals:   04/07/16 1216  TempSrc: Oral      Patients Stated Pain Goal: 3 ([id number])  Complications: No apparent anesthesia complications

## 2016-04-08 ENCOUNTER — Encounter (HOSPITAL_BASED_OUTPATIENT_CLINIC_OR_DEPARTMENT_OTHER): Payer: Self-pay | Admitting: General Surgery

## 2016-04-16 ENCOUNTER — Other Ambulatory Visit: Payer: Self-pay | Admitting: Neurology

## 2016-06-03 ENCOUNTER — Encounter (HOSPITAL_COMMUNITY): Payer: Self-pay | Admitting: Emergency Medicine

## 2016-06-03 ENCOUNTER — Emergency Department (HOSPITAL_COMMUNITY)
Admission: EM | Admit: 2016-06-03 | Discharge: 2016-06-04 | Disposition: A | Discharge status: Home / Self Care | Payer: Medicare Other | Attending: Emergency Medicine | Admitting: Emergency Medicine

## 2016-06-03 DIAGNOSIS — Z8673 Personal history of transient ischemic attack (TIA), and cerebral infarction without residual deficits: Secondary | ICD-10-CM | POA: Diagnosis not present

## 2016-06-03 DIAGNOSIS — R110 Nausea: Secondary | ICD-10-CM | POA: Diagnosis present

## 2016-06-03 DIAGNOSIS — R509 Fever, unspecified: Secondary | ICD-10-CM

## 2016-06-03 DIAGNOSIS — Z87891 Personal history of nicotine dependence: Secondary | ICD-10-CM | POA: Diagnosis not present

## 2016-06-03 DIAGNOSIS — N3000 Acute cystitis without hematuria: Secondary | ICD-10-CM | POA: Diagnosis not present

## 2016-06-03 LAB — COMPREHENSIVE METABOLIC PANEL
ALT: 17 U/L (ref 17–63)
AST: 24 U/L (ref 15–41)
Albumin: 3.8 g/dL (ref 3.5–5.0)
Alkaline Phosphatase: 54 U/L (ref 38–126)
Anion gap: 10 (ref 5–15)
BILIRUBIN TOTAL: 2.5 mg/dL — AB (ref 0.3–1.2)
BUN: 14 mg/dL (ref 6–20)
CHLORIDE: 102 mmol/L (ref 101–111)
CO2: 22 mmol/L (ref 22–32)
CREATININE: 1.1 mg/dL (ref 0.61–1.24)
Calcium: 8.5 mg/dL — ABNORMAL LOW (ref 8.9–10.3)
Glucose, Bld: 244 mg/dL — ABNORMAL HIGH (ref 65–99)
POTASSIUM: 3.9 mmol/L (ref 3.5–5.1)
Sodium: 134 mmol/L — ABNORMAL LOW (ref 135–145)
TOTAL PROTEIN: 7 g/dL (ref 6.5–8.1)

## 2016-06-03 LAB — CBC
HEMATOCRIT: 41.9 % (ref 39.0–52.0)
Hemoglobin: 14.5 g/dL (ref 13.0–17.0)
MCH: 31.4 pg (ref 26.0–34.0)
MCHC: 34.6 g/dL (ref 30.0–36.0)
MCV: 90.7 fL (ref 78.0–100.0)
PLATELETS: 163 10*3/uL (ref 150–400)
RBC: 4.62 MIL/uL (ref 4.22–5.81)
RDW: 13.1 % (ref 11.5–15.5)
WBC: 14.1 10*3/uL — AB (ref 4.0–10.5)

## 2016-06-03 LAB — LIPASE, BLOOD: LIPASE: 17 U/L (ref 11–51)

## 2016-06-03 MED ORDER — ONDANSETRON 4 MG PO TBDP
4.0000 mg | ORAL_TABLET | Freq: Once | ORAL | Status: AC | PRN
  Administered 2016-06-03: 4 mg via ORAL
  Filled 2016-06-03: qty 1

## 2016-06-03 MED ORDER — SODIUM CHLORIDE 0.9 % IV BOLUS (SEPSIS)
500.0000 mL | Freq: Once | INTRAVENOUS | Status: AC
  Administered 2016-06-03: 500 mL via INTRAVENOUS

## 2016-06-03 MED ORDER — PROCHLORPERAZINE EDISYLATE 5 MG/ML IJ SOLN
5.0000 mg | Freq: Once | INTRAMUSCULAR | Status: AC
  Administered 2016-06-03: 5 mg via INTRAVENOUS
  Filled 2016-06-03: qty 2

## 2016-06-03 MED ORDER — SODIUM CHLORIDE 0.9 % IV SOLN
INTRAVENOUS | Status: DC
  Administered 2016-06-03: via INTRAVENOUS

## 2016-06-03 NOTE — ED Triage Notes (Signed)
Patient reports nausea and vomiting since 1430 today. Denies any pain. States he was hospitalized for same in December and "they never figured out what was wrong." Denies chest pain, SOB, and diarrhea.

## 2016-06-03 NOTE — ED Provider Notes (Addendum)
[facility] DEPT Provider Note   CSN: [id number] Arrival date & time: 06/03/16  2130     History   Chief Complaint Chief Complaint  Patient presents with  . Nausea  . Emesis    HPI David Li is a 76 y.o. male.  Patient reports rather sudden onset of nausea about 2:30 PM today. He tried taking Phenergan, without relief. He denies nausea, diarrhea, fever, chills, chest pain, weakness or dizziness. He reports having similar episodes of nausea, evaluated with endoscopy and ultimately had a cholecystectomy, about 2 months ago. No other intervening problems. He is taking his usual medications. He came here by prior vehicle. He denies headache or dizziness. There are no other known modifying factors.  HPI  Past Medical History:  Diagnosis Date  . BPH (benign prostatic hyperplasia)    self caths 1-2 times daily.  . Neuromuscular disorder ([place])    idiopathic,both feet,progressing to legs  . Perforated ulcer ([place]) 1982  . Stroke [facility])    states the Plavix is preventative due to the strong family history of stroke    Patient Active Problem List   Diagnosis Date Noted  . Cholelithiasis   . Sepsis ([place]) 03/02/2016  . Prolonged Q-T interval on ECG 03/02/2016  . Hypokalemia 03/02/2016  . Neurogenic bladder 03/02/2016  . Pneumonia 02/28/2016  . Acute respiratory failure with hypoxia ([place]) 02/28/2016  . Nausea without vomiting 02/28/2016  . Acute respiratory failure ([place]) 02/28/2016  . Low back pain 05/28/2015  . Paresthesia 05/28/2015  . Tremor, essential 03/02/2014  . Hereditary and idiopathic peripheral neuropathy 03/01/2013  . Vasculitis ([place]) 03/01/2013    Past Surgical History:  Procedure Laterality Date  . CHOLECYSTECTOMY N/A 04/07/2016   Procedure: LAPAROSCOPIC CHOLECYSTECTOMY;  Surgeon: Mickeal Skinner, MD;  Location: [facility];  Service: General;  Laterality: N/A;  . ESOPHAGOGASTRODUODENOSCOPY N/A 03/05/2016   Procedure:  ESOPHAGOGASTRODUODENOSCOPY (EGD);  Surgeon: Laurence Spates, MD;  Location: [facility];  Service: Endoscopy;  Laterality: N/A;  . HERNIA REPAIR  2012  . TONSILLECTOMY    . TRANSURETHRAL RESECTION OF PROSTATE  2006       Home Medications    Prior to Admission medications   Medication Sig Start Date End Date Taking? Authorizing Provider  acyclovir (ZOVIRAX) 400 MG tablet Take 400 mg by mouth daily as needed. Outbreaks    Historical Provider, MD  ALPRAZolam Duanne Moron) 0.25 MG tablet Take 0.25 mg by mouth at bedtime.    Historical Provider, MD  cholecalciferol (VITAMIN D) 1000 UNITS tablet Take 2,000 Units by mouth daily with lunch.     Historical Provider, MD  clopidogrel (PLAVIX) 75 MG tablet TAKE 1 TABLET EVERY DAY 04/17/16   Marcial Pacas, MD  gabapentin (NEURONTIN) 800 MG tablet Take 1 tablet (800 mg total) by mouth 3 (three) times daily. 11/27/15   Marcial Pacas, MD  HYDROcodone-acetaminophen (NORCO/VICODIN) 5-325 MG tablet Take 1-2 tablets by mouth every 6 (six) hours as needed for moderate pain. 04/07/16   Mickeal Skinner, MD  ibuprofen (ADVIL,MOTRIN) 800 MG tablet Take 1 tablet (800 mg total) by mouth every 8 (eight) hours as needed. 04/07/16   Mickeal Skinner, MD  omeprazole (PRILOSEC) 40 MG capsule Take 1 capsule (40 mg total) by mouth 2 (two) times daily before a meal. 03/05/16   Clanford Marisa Hua, MD  prochlorperazine (COMPAZINE) 5 MG tablet Take 2 tablets (10 mg total) by mouth every 6 (six) hours as needed for nausea or vomiting. 06/04/16   Daleen Bo,  MD  promethazine (PHENERGAN) 12.5 MG suppository Place 1 suppository (12.5 mg total) rectally every 6 (six) hours as needed for nausea, vomiting or refractory nausea / vomiting. 03/05/16   Clanford Marisa Hua, MD  thiamine (VITAMIN B-1) 100 MG tablet Take 100 mg by mouth daily.    Historical Provider, MD  zolpidem (AMBIEN) 10 MG tablet Take 2.5 mg by mouth at bedtime as needed for sleep.     Historical Provider, MD    Family  History Family History  Problem Relation Age of Onset  . Stroke Mother   . Diabetes Mother   . Stroke Father   . Diabetes Father   . Diabetes Sister   . Diabetes Brother     Social History Social History  Substance Use Topics  . Smoking status: Former Research scientist (life sciences)  . Smokeless tobacco: Never Used  . Alcohol use 0.6 oz/week    1 Glasses of wine per week     Comment: daily     Allergies   Other   Review of Systems Review of Systems  All other systems reviewed and are negative.    Physical Exam Updated Vital Signs BP 122/62 (BP Location: Right Arm)   Pulse 102   Temp 101.1 F (38.4 C) (Oral)   Resp 26   SpO2 91%   Physical Exam  Constitutional: He is oriented to person, place, and time. He appears well-developed and well-nourished.  HENT:  Head: Normocephalic and atraumatic.  Right Ear: External ear normal.  Left Ear: External ear normal.  Eyes: Conjunctivae and EOM are normal. Pupils are equal, round, and reactive to light.  Neck: Normal range of motion and phonation normal. Neck supple.  Cardiovascular: Normal rate, regular rhythm and normal heart sounds.   Pulmonary/Chest: Effort normal and breath sounds normal. No respiratory distress. He exhibits no bony tenderness.  Abdominal: Soft. Bowel sounds are normal. He exhibits no distension. There is no tenderness.  Well-healed abdominal laparoscopic scars.  Musculoskeletal: Normal range of motion.  Neurological: He is alert and oriented to person, place, and time. No cranial nerve deficit or sensory deficit. He exhibits normal muscle tone. Coordination normal.  No dysarthria and aphasia or nystagmus.  Skin: Skin is warm, dry and intact.  Psychiatric: He has a normal mood and affect. His behavior is normal. Judgment and thought content normal.  Nursing note and vitals reviewed.    ED Treatments / Results  Labs (all labs ordered are listed, but only abnormal results are displayed) Labs Reviewed  COMPREHENSIVE  METABOLIC PANEL - Abnormal; Notable for the following:       Result Value   Sodium 134 (*)    Glucose, Bld 244 (*)    Calcium 8.5 (*)    Total Bilirubin 2.5 (*)    All other components within normal limits  CBC - Abnormal; Notable for the following:    WBC 14.1 (*)    All other components within normal limits  LIPASE, BLOOD  URINALYSIS, ROUTINE W REFLEX MICROSCOPIC    EKG  EKG Interpretation  Date/Time:  Tuesday June 03 2016 23:55:04 EST Ventricular Rate:  106 PR Interval:    QRS Duration: 94 QT Interval:  341 QTC Calculation: 453 R Axis:   37 Text Interpretation:  Sinus tachycardia Probable left atrial enlargement since last tracing no significant change Confirmed by Eulis Foster  MD, Ashanty Coltrane [id number]) on 06/04/2016 12:29:22 AM       Radiology No results found.  Procedures Procedures (including critical care time)  Medications Ordered in  ED Medications  0.9 %  sodium chloride infusion ( Intravenous New Bag/Given 06/03/16 2351)  ondansetron (ZOFRAN-ODT) disintegrating tablet 4 mg (4 mg Oral Given 06/03/16 2211)  prochlorperazine (COMPAZINE) injection 5 mg (5 mg Intravenous Given 06/03/16 2351)  sodium chloride 0.9 % bolus 500 mL (0 mLs Intravenous Stopped 06/04/16 0118)  acetaminophen (TYLENOL) tablet 650 mg (650 mg Oral Given 06/04/16 0118)     Initial Impression / Assessment and Plan / ED Course  I have reviewed the triage vital signs and the nursing notes.  Pertinent labs & imaging results that were available during my care of the patient were reviewed by me and considered in my medical decision making (see chart for details).  Clinical Course as of Jun 04 138  Wed Jun 04, 2016  [id number] He states the nausea is resolved, and he wants to try some ice chips.  [EW]  0138 At this time, he is comfortable and lying in the left lateral decubitus position. On room air, the oxygen saturation is 88%. He was able to take about 10 deep breaths and saturation improved to 93%. At this  time, he relates that he does do urinary self-catheterization at nighttime because of post-void residuals, up to 12 ounces. Since he now has a temperature urinalysis and chest x-ray has been ordered.  [EW]    Clinical Course User Index [EW] Daleen Bo, MD    Medications  0.9 %  sodium chloride infusion ( Intravenous New Bag/Given 06/03/16 2351)  ondansetron (ZOFRAN-ODT) disintegrating tablet 4 mg (4 mg Oral Given 06/03/16 2211)  prochlorperazine (COMPAZINE) injection 5 mg (5 mg Intravenous Given 06/03/16 2351)  sodium chloride 0.9 % bolus 500 mL (0 mLs Intravenous Stopped 06/04/16 0118)  acetaminophen (TYLENOL) tablet 650 mg (650 mg Oral Given 06/04/16 0118)    Patient Vitals for the past 24 hrs:  BP Temp Temp src Pulse Resp SpO2  06/04/16 0052 122/62 - - 102 26 91 %  06/04/16 0011 129/62 101.1 F (38.4 C) Oral 103 (!) 33 90 %  06/03/16 2135 143/83 98.7 F (37.1 C) Oral 94 18 90 %    1:00 AM Reevaluation with update and discussion. After initial assessment and treatment, an updated evaluation reveals He is more comfortable. Findings discussed with patient and wife, all questions answered. Manu Rubey L    01:40- initial discharge, interrupted because of temperature 101.1, and oxygen saturation 88% on room air. The patient states he has been having a mild nonproductive cough recently.  Final Clinical Impressions(s) / ED Diagnoses   Final diagnoses:  Nausea  Fever, unspecified fever cause   Nonspecific, nausea. Recent Transient evaluation, including endoscopy, and cholecystectomy. Doubt ACS, serious bacterial infection, metabolic instability or impending vascular collapse.  Nursing Notes Reviewed/ Care Coordinated Applicable Imaging Reviewed Interpretation of Laboratory Data incorporated into ED treatment  The patient appears reasonably screened and/or stabilized for discharge and I doubt any other medical condition or other [facility] requiring further screening, evaluation, or  treatment in the ED at this time prior to discharge.  Plan: Home Medications- continue; Home Treatments- rest; return here if the recommended treatment, does not improve the symptoms; Recommended follow up- PCP   New Prescriptions New Prescriptions   PROCHLORPERAZINE (COMPAZINE) 5 MG TABLET    Take 2 tablets (10 mg total) by mouth every 6 (six) hours as needed for nausea or vomiting.     Daleen Bo, MD 06/04/16 [id number]    Daleen Bo, MD 06/04/16 [phone]

## 2016-06-04 ENCOUNTER — Emergency Department (HOSPITAL_COMMUNITY): Payer: Medicare Other

## 2016-06-04 LAB — URINALYSIS, ROUTINE W REFLEX MICROSCOPIC
BILIRUBIN URINE: NEGATIVE
Glucose, UA: 150 mg/dL — AB
HGB URINE DIPSTICK: NEGATIVE
Ketones, ur: 5 mg/dL — AB
NITRITE: NEGATIVE
PH: 6 (ref 5.0–8.0)
PROTEIN: NEGATIVE mg/dL
Specific Gravity, Urine: 1.017 (ref 1.005–1.030)
Trans Epithel, UA: 1

## 2016-06-04 MED ORDER — CEPHALEXIN 500 MG PO CAPS: 500.0000 mg | ORAL_CAPSULE | Freq: Three times a day (TID) | ORAL | 0 refills | Status: DC

## 2016-06-04 MED ORDER — ACETAMINOPHEN 325 MG PO TABS
650.0000 mg | ORAL_TABLET | Freq: Once | ORAL | Status: AC
  Administered 2016-06-04: 650 mg via ORAL
  Filled 2016-06-04: qty 2

## 2016-06-04 MED ORDER — PROCHLORPERAZINE MALEATE 5 MG PO TABS: 10.0000 mg | ORAL_TABLET | Freq: Four times a day (QID) | ORAL | 1 refills | Status: DC | PRN

## 2016-06-04 MED ORDER — DEXTROSE 5 % IV SOLN
1.0000 g | Freq: Once | INTRAVENOUS | Status: AC
  Administered 2016-06-04: 1 g via INTRAVENOUS
  Filled 2016-06-04: qty 10

## 2016-06-04 NOTE — Discharge Instructions (Addendum)
You were seen today for vomiting. You developed a fever and it appears that you have a urinary tract infection. You were given an antibiotic. Given the you are tolerating fluids and had no other concerns at this time, feel you can be treated as an outpatient. If he develops persistent fever, back pain, or any new or worsening symptoms he needs be reevaluated.

## 2016-06-04 NOTE — ED Notes (Signed)
Pt given ice chips and water per Dr. Eulis Foster request.

## 2016-06-04 NOTE — ED Provider Notes (Signed)
Patient signed out by Dr. Eulis Foster. Initially presented with isolated nausea. Workup initially reassuring; however, patient developed fever at discharge. Chest x-ray and urinalysis added to workup. Patient self caths. Urine does appear to be infected. Urine culture added.  Patient given IV Rocephin. He has a mild leukocytosis. He denies any flank pain. On recheck, he states that he feels better. Feel a trial of outpatient antibiotics is reasonable. Discussed with patient if he has recurrent fevers, flank pain, or any new or worsening symptoms he needs to be reevaluated immediately.  After history, exam, and medical workup I feel the patient has been appropriately medically screened and is safe for discharge home. Pertinent diagnoses were discussed with the patient. Patient was given return precautions.    Merryl Hacker, MD 06/04/16 0330

## 2016-06-04 NOTE — ED Notes (Signed)
Report given to [facility].

## 2016-06-04 NOTE — ED Notes (Signed)
Pt is aware urine is needed 

## 2016-06-05 LAB — URINE CULTURE

## 2016-11-28 ENCOUNTER — Other Ambulatory Visit: Payer: Self-pay | Admitting: Neurology

## 2016-12-01 ENCOUNTER — Ambulatory Visit (INDEPENDENT_AMBULATORY_CARE_PROVIDER_SITE_OTHER): Payer: Medicare Other | Admitting: Nurse Practitioner

## 2016-12-01 ENCOUNTER — Encounter: Payer: Self-pay | Admitting: Nurse Practitioner

## 2016-12-01 VITALS — BP 139/75 | HR 67 | Wt 208.0 lb

## 2016-12-01 DIAGNOSIS — G609 Hereditary and idiopathic neuropathy, unspecified: Secondary | ICD-10-CM

## 2016-12-01 DIAGNOSIS — Z8673 Personal history of transient ischemic attack (TIA), and cerebral infarction without residual deficits: Secondary | ICD-10-CM | POA: Insufficient documentation

## 2016-12-01 MED ORDER — GABAPENTIN 800 MG PO TABS: 800.0000 mg | ORAL_TABLET | Freq: Three times a day (TID) | ORAL | 3 refills | Status: DC

## 2016-12-01 MED ORDER — CLOPIDOGREL BISULFATE 75 MG PO TABS: 75.0000 mg | ORAL_TABLET | Freq: Every day | ORAL | 3 refills | Status: DC

## 2016-12-01 MED ORDER — DULOXETINE HCL 30 MG PO CPEP: 30.0000 mg | ORAL_CAPSULE | Freq: Every day | ORAL | 3 refills | Status: DC

## 2016-12-01 NOTE — Progress Notes (Signed)
I have reviewed and agreed above plan. 

## 2016-12-01 NOTE — Patient Instructions (Addendum)
Continue gabapentin at current dose 800 mg 3 times daily Add CYMBALTA 30 MG DAILY Be careful with ambulation at risk for falls due to neuropathy Follow-up yearly and when necessary Peripheral Neuropathy Peripheral neuropathy is a type of nerve damage. It affects nerves that carry signals between the spinal cord and other parts of the body. These are called peripheral nerves. With peripheral neuropathy, one nerve or a group of nerves may be damaged. What are the causes? Many things can damage peripheral nerves. For some people with peripheral neuropathy, the cause is unknown. Some causes include:  Diabetes. This is the most common cause of peripheral neuropathy.  Injury to a nerve.  Pressure or stress on a nerve that lasts a long time.  Too little vitamin B. Alcoholism can lead to this.  Infections.  Autoimmune diseases, such as multiple sclerosis and systemic lupus erythematosus.  Inherited nerve diseases.  Some medicines, such as cancer drugs.  Toxic substances, such as lead and mercury.  Too little blood flowing to the legs.  Kidney disease.  Thyroid disease.  What are the signs or symptoms? Different people have different symptoms. The symptoms you have will depend on which of your nerves is damaged. Common symptoms include:  Loss of feeling (numbness) in the feet and hands.  Tingling in the feet and hands.  Pain that burns.  Very sensitive skin.  Weakness.  Not being able to move a part of the body (paralysis).  Muscle twitching.  Clumsiness or poor coordination.  Loss of balance.  Not being able to control your bladder.  Feeling dizzy.  Sexual problems.  How is this diagnosed? Peripheral neuropathy is a symptom, not a disease. Finding the cause of peripheral neuropathy can be hard. To figure that out, your health care provider will take a medical history and do a physical exam. A neurological exam will also be done. This involves checking things  affected by your brain, spinal cord, and nerves (nervous system). For example, your health care provider will check your reflexes, how you move, and what you can feel. Other types of tests may also be ordered, such as:  Blood tests.  A test of the fluid in your spinal cord.  Imaging tests, such as CT scans or an MRI.  Electromyography (EMG). This test checks the nerves that control muscles.  Nerve conduction velocity tests. These tests check how fast messages pass through your nerves.  Nerve biopsy. A small piece of nerve is removed. It is then checked under a microscope.  How is this treated?  Medicine is often used to treat peripheral neuropathy. Medicines may include: ? Pain-relieving medicines. Prescription or over-the-counter medicine may be suggested. ? Antiseizure medicine. This may be used for pain. ? Antidepressants. These also may help ease pain from neuropathy. ? Lidocaine. This is a numbing medicine. You might wear a patch or be given a shot. ? Mexiletine. This medicine is typically used to help control irregular heart rhythms.  Surgery. Surgery may be needed to relieve pressure on a nerve or to destroy a nerve that is causing pain.  Physical therapy to help movement.  Assistive devices to help movement. Follow these instructions at home:  Only take over-the-counter or prescription medicines as directed by your health care provider. Follow the instructions carefully for any given medicines. Do not take any other medicines without first getting approval from your health care provider.  If you have diabetes, work closely with your health care provider to keep your blood sugar under  control.  If you have numbness in your feet: ? Check every day for signs of injury or infection. Watch for redness, warmth, and swelling. ? Wear padded socks and comfortable shoes. These help protect your feet.  Do not do things that put pressure on your damaged nerve.  Do not smoke.  Smoking keeps blood from getting to damaged nerves.  Avoid or limit alcohol. Too much alcohol can cause a lack of B vitamins. These vitamins are needed for healthy nerves.  Develop a good support system. Coping with peripheral neuropathy can be stressful. Talk to a mental health specialist or join a support group if you are struggling.  Follow up with your health care provider as directed. Contact a health care provider if:  You have new signs or symptoms of peripheral neuropathy.  You are struggling emotionally from dealing with peripheral neuropathy.  You have a fever. Get help right away if:  You have an injury or infection that is not healing.  You feel very dizzy or begin vomiting.  You have chest pain.  You have trouble breathing. This information is not intended to replace advice given to you by your health care provider. Make sure you discuss any questions you have with your health care provider. Document Released: 04/18/2002 Document Revised: 10/04/2015 Document Reviewed: 01/03/2013 Elsevier Interactive Patient Education  2017 [employer].

## 2016-12-01 NOTE — Progress Notes (Signed)
[facility]  PATIENT: David Li DOB: [birth year]   REASON FOR VISIT: Follow-up for neuropathy which has worsened, history of stroke HISTORY FROM: Patient    HISTORY OF PRESENT ILLNESS:David Li, is a 76 yo white male return for follow up of idiopathic peripheral neuropathy.   He was followed by David Li since April 2006, when he presented with sensory changes starting at the soles of his feet, gradually ascending to bilateral lower extremities, fairly symmetric. NCV and spine imaging were negative then.  In August 2006 he was hospitalized with fever and myalgias, and temporal artery biopsy demonstrated vasculitis. He has since been treated with methotrexate and prednisone, with good response of vasculitis. He is no longer on immunosuppressive treatment.  His neuropathy has progressed by exam and followup NCV. Symptoms have been fairly well controlled on gabapentin 600 mg 3 times a day and nortriptyline 25 mg every night. He had a few bouts of double vision a few years ago, thought likely due to vasculitis. He had a spell of dizziness in 2008, negative imaging at that time, seen in the ER by David Li and treated with Plavix for possible TIA.   Last visit was in October 2015 by David Li, he continued to complains of slow progression of numbness, tingling, neuropathic pain, now numbness at both mid thigh level. He also noticed mild fingertips decreased sensation, change in his handwriting, he has mild unsteady gait, used to work as a Development worker, community, now has difficulty with his balance. He currently work [employer], enjoys reading and learning   He is taking titrating dose of gabapentin, currently gabapentin 600 mg 3 times a day, nortriptyline 25 mg 1 tablet at night  UPDATE May 28 2015: He continues to complains of burning at the bottom of his feet, he is taking Lyrica, which seems to help, he is planning on to move to gabapentin  600mg  tid to see which one helps him better He has mild low back pain,no radiating pain to his legs, he has to self cath once a day since 2016, this happened following his previous TURP surgery, he has difficulty completely empty his bladder, he also complains of worsening constipation.  He feel hand swollen, tightness, he complains of worsening hand writing. CSF showed total elevated total protein 63, normal glucose, normal cells  EMG nerve conduction study January 2017 showed mild axonal peripheral neuropathy, no evidence of demyelinating features  UPDATE July 18th 2017: He continue complains of intermittent bilateral feet and hand low-grade paresthesia, despite taking gabapentin 600 mg 3 times a day, denied weakness, mildly difficulty with tandem walking  UPDATE 07/23/2018[name], 76 year old male returns for follow-up with history of peripheral neuropathy. EMG nerve conduction in January 2017 showed mixed axonal peripheral neuropathy is of demyelinating factors. He is currently on gabapentin 800 mg 3 times daily. He  continues to complain of burning on the soles of his feet. He denies any falls. He continues to exercise. He returns for reevaluation  REVIEW OF SYSTEMS: Full 14 system review of systems performed and notable only for those listed, all others are neg:  Constitutional: neg  Cardiovascular: neg Ear/Nose/Throat: neg  Skin: neg Eyes: neg Respiratory: neg Gastroitestinal: neg  Hematology/Lymphatic: neg  Endocrine: neg Musculoskeletal:neg Allergy/Immunology: neg Neurological: neg Psychiatric: neg Sleep : neg   ALLERGIES: Allergies  Allergen Reactions  . Other     Muscle relaxant medication-unsure of the name    HOME MEDICATIONS: Outpatient Medications Prior to Visit  Medication Sig Dispense Refill  . acyclovir (ZOVIRAX) 400 MG tablet Take 400 mg by mouth daily as needed. Outbreaks    . ALPRAZolam (XANAX) 0.25 MG tablet Take 0.25 mg by mouth at bedtime.      . cholecalciferol (VITAMIN D) 1000 UNITS tablet Take 2,000 Units by mouth daily with lunch.     . clopidogrel (PLAVIX) 75 MG tablet TAKE 1 TABLET EVERY DAY 90 tablet 3  . gabapentin (NEURONTIN) 800 MG tablet TAKE 1 TABLET (800 MG TOTAL) BY MOUTH 3 (THREE) TIMES DAILY. 270 tablet 3  . omeprazole (PRILOSEC) 40 MG capsule Take 1 capsule (40 mg total) by mouth 2 (two) times daily before a meal. 60 capsule 1  . prochlorperazine (COMPAZINE) 5 MG tablet Take 2 tablets (10 mg total) by mouth every 6 (six) hours as needed for nausea or vomiting. 30 tablet 1  . promethazine (PHENERGAN) 12.5 MG suppository Place 1 suppository (12.5 mg total) rectally every 6 (six) hours as needed for nausea, vomiting or refractory nausea / vomiting. 12 each 0  . thiamine (VITAMIN B-1) 100 MG tablet Take 100 mg by mouth daily.    Marland Kitchen zolpidem (AMBIEN) 10 MG tablet Take 2.5 mg by mouth at bedtime as needed for sleep.     . cephALEXin (KEFLEX) 500 MG capsule Take 1 capsule (500 mg total) by mouth 3 (three) times daily. 21 capsule 0  . HYDROcodone-acetaminophen (NORCO/VICODIN) 5-325 MG tablet Take 1-2 tablets by mouth every 6 (six) hours as needed for moderate pain. 30 tablet 0  . ibuprofen (ADVIL,MOTRIN) 800 MG tablet Take 1 tablet (800 mg total) by mouth every 8 (eight) hours as needed. 30 tablet 0   No facility-administered medications prior to visit.     PAST MEDICAL HISTORY: Past Medical History:  Diagnosis Date  . BPH (benign prostatic hyperplasia)    self caths 1-2 times daily.  . Neuromuscular disorder ([place])    idiopathic,both feet,progressing to legs  . Perforated ulcer ([place]) 1982  . Stroke [facility])    states the Plavix is preventative due to the strong family history of stroke    PAST SURGICAL HISTORY: Past Surgical History:  Procedure Laterality Date  . CHOLECYSTECTOMY N/A 04/07/2016   Procedure: LAPAROSCOPIC CHOLECYSTECTOMY;  Surgeon: Mickeal Skinner, MD;  Location: [facility];  Service:  General;  Laterality: N/A;  . ESOPHAGOGASTRODUODENOSCOPY N/A 03/05/2016   Procedure: ESOPHAGOGASTRODUODENOSCOPY (EGD);  Surgeon: Laurence Spates, MD;  Location: [facility];  Service: Endoscopy;  Laterality: N/A;  . HERNIA REPAIR  2012  . TONSILLECTOMY    . TRANSURETHRAL RESECTION OF PROSTATE  2006    FAMILY HISTORY: Family History  Problem Relation Age of Onset  . Stroke Mother   . Diabetes Mother   . Stroke Father   . Diabetes Father   . Diabetes Sister   . Diabetes Brother     SOCIAL HISTORY: Social History   Social History  . Marital status: Married    Spouse name: Vinnie Level  . Number of children: 2  . Years of education: college   Occupational History  .      retired   Social History Main Topics  . Smoking status: Former Research scientist (life sciences)  . Smokeless tobacco: Never Used  . Alcohol use 0.6 oz/week    1 Glasses of wine per week     Comment: daily  . Drug use: No  . Sexual activity: Not on file   Other Topics Concern  . Not on file   Social History  Narrative   Patient is retired, Married for 26 years Vinnie Level).   EducationAdministrator, sports.   Right handed.   Caffeine- five cups daily only tea           PHYSICAL EXAM  Vitals:   12/01/16 0940  BP: 139/75  Pulse: 67  Weight: 208 lb (94.3 kg)   Body mass index is 30.28 kg/m.  Generalized: Well developed, in no acute distress  Head: normocephalic and atraumatic,. Oropharynx benign  Neck: Supple, no carotid bruits  Musculoskeletal: No deformity   Neurological examination   Mentation: Alert oriented to time, place, history taking. Attention span and concentration appropriate. Recent and remote memory intact.  Follows all commands speech and language fluent.   Cranial nerve II-XII: Pupils were equal round reactive to light extraocular movements were full, visual field were full on confrontational test. Facial sensation and strength were normal. hearing was intact to finger rubbing bilaterally. Uvula tongue midline. head  turning and shoulder shrug were normal and symmetric.Tongue protrusion into cheek strength was normal. Motor: normal bulk and tone, full strength in the BUE, BLE, fine finger movements normal, no pronator drift. No focal weakness Sensory: Length dependent decreased to light touch pinprick and vibration in the lower extremities Coordination: finger-nose-finger, heel-to-shin bilaterally, no dysmetria Reflexes: Absent, plantar responses were flexor bilaterally. Gait and Station: Rising up from seated position without assistance, normal stance,  moderate stride, good arm swing, smooth turning, able to perform tiptoe, and heel walking without difficulty. Tandem gait is unsteady. No assistive device  DIAGNOSTIC DATA (LABS, IMAGING, TESTING) - I reviewed patient records, labs, notes, testing and imaging myself where available.  Lab Results  Component Value Date   WBC 14.1 (H) 06/03/2016   HGB 14.5 06/03/2016   HCT 41.9 06/03/2016   MCV 90.7 06/03/2016   PLT 163 06/03/2016      Component Value Date/Time   NA 134 (L) 06/03/2016 2211   NA 139 03/05/2015 1144   K 3.9 06/03/2016 2211   CL 102 06/03/2016 2211   CO2 22 06/03/2016 2211   GLUCOSE 244 (H) 06/03/2016 2211   BUN 14 06/03/2016 2211   BUN 9 03/05/2015 1144   CREATININE 1.10 06/03/2016 2211   CALCIUM 8.5 (L) 06/03/2016 2211   PROT 7.0 06/03/2016 2211   PROT 6.8 03/05/2015 1144   ALBUMIN 3.8 06/03/2016 2211   ALBUMIN 4.1 03/05/2015 1144   AST 24 06/03/2016 2211   ALT 17 06/03/2016 2211   ALKPHOS 54 06/03/2016 2211   BILITOT 2.5 (H) 06/03/2016 2211   BILITOT 0.5 03/05/2015 1144   GFRNONAA >60 06/03/2016 2211   GFRAA >60 06/03/2016 2211    Lab Results  Component Value Date   HGBA1C 6.1 (H) 03/05/2015   Lab Results  Component Value Date   VITAMINB12 507 03/05/2015   Lab Results  Component Value Date   TSH 0.957 02/28/2016      ASSESSMENT AND PLAN  76 y.o. year old male  has a past medical history of BPH (benign  prostatic hyperplasia); Neuromuscular disorder ([place]);  and Stroke ([place]). here To follow-up for progressive ascending length dependent peripheral neuropathy and neuropathic pain which has increased.  Gabapentin has worked better than Lyrica.  Continue gabapentin at current dose 800 mg 3 times daily will refill Add CYMBALTA 30 MG DAILY  Continue Plavix for secondary stroke prevention Be careful with ambulation at risk for falls due to neuropathy Patient was given some information on peripheral neuropathy, check feet every day for injury or infection,  take medications as prescribed Follow-up yearly and when necessary Dennie Bible, [facility], [facility], APRN  [facility] [address] [place], [place] [address] [phone]

## 2017-08-06 ENCOUNTER — Telehealth: Payer: Self-pay | Admitting: Nurse Practitioner

## 2017-08-06 NOTE — Telephone Encounter (Signed)
Spoke with patient and rescheduled his follow up for 08/11/17. He understands to arrive early to check in. He verbalized appreciation for call back.

## 2017-08-06 NOTE — Telephone Encounter (Signed)
Pt calling to inform that re: his Neuropathy there has been an increase in his pain for approximately a month.  Pt is asking for a call

## 2017-08-06 NOTE — Telephone Encounter (Signed)
Ok to move appt up. Make sure his MD is here that day. Thanks

## 2017-08-06 NOTE — Telephone Encounter (Addendum)
Spoke with patient who stated his pain is "ratcheting up". He stated the soles of his feet to his thighs are now painful. He reports burning pain in his knees which makes it difficult to walk. He is taking Gabapentin 800 mg three x daily, Cymbalta 30 mg daily.  This RN confirmed his pharmacy and asked him if he'd be available to come in to see NP in the next several days if she wants to see him. He stated he could. Patient understands this RN will call him back today to let him know NP's recommendations. He verbalized appreciation for call.

## 2017-08-10 NOTE — Progress Notes (Signed)
[facility]  PATIENT: David Li DOB: [birth year]   REASON FOR VISIT: Follow-up for neuropathy which has worsened, history of stroke HISTORY FROM: Patient    HISTORY OF PRESENT ILLNESS:David Li, is a 77 yo white male return for follow up of idiopathic peripheral neuropathy.   He was followed by Dr. Doy Mince since April 2006, when he presented with sensory changes starting at the soles of his feet, gradually ascending to bilateral lower extremities, fairly symmetric. NCV and spine imaging were negative then.  In August 2006 he was hospitalized with fever and myalgias, and temporal artery biopsy demonstrated vasculitis. He has since been treated with methotrexate and prednisone, with good response of vasculitis. He is no longer on immunosuppressive treatment.  His neuropathy has progressed by exam and followup NCV. Symptoms have been fairly well controlled on gabapentin 600 mg 3 times a day and nortriptyline 25 mg every night. He had a few bouts of double vision a few years ago, thought likely due to vasculitis. He had a spell of dizziness in 2008, negative imaging at that time, seen in the ER by Dr. Erling Cruz and treated with Plavix for possible TIA.   Last visit was in October 2015 by nurse practitioner [name], he continued to complains of slow progression of numbness, tingling, neuropathic pain, now numbness at both mid thigh level. 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, enjoys reading and learning   He is taking titrating dose of gabapentin, currently gabapentin 600 mg 3 times a day, nortriptyline 25 mg 1 tablet at night  UPDATE May 28 2015:YY He continues to complains of burning at the bottom of his feet, he is taking Lyrica, which seems to help, he is planning on to move to gabapentin  600mg  tid to see which one helps him better He has mild low back pain,no radiating pain to his legs, he has to self cath once a day since 2016, this happened following his previous TURP surgery, he has difficulty completely empty his bladder, he also complains of worsening constipation.  He feel hand swollen, tightness, he complains of worsening hand writing. CSF showed total elevated total protein 63, normal glucose, normal cells  EMG nerve conduction study January 2017 showed mild axonal peripheral neuropathy, no evidence of demyelinating features  UPDATE July 18th 2017:YY He continue complains of intermittent bilateral feet and hand low-grade paresthesia, despite taking gabapentin 600 mg 3 times a day, denied weakness, mildly difficulty with tandem walking  UPDATE 07/23/2018[name], 77 year old male returns for follow-up with history of peripheral neuropathy. EMG nerve conduction in January 2017 showed mixed axonal peripheral neuropathy  He is currently on gabapentin 800 mg 3 times daily. He  continues to complain of burning on the soles of his feet. He denies any falls. He continues to exercise. He returns for reevaluation UPDATE 4/2/2019[name], 77 year old male returns for follow-up with worsening peripheral neuropathy.  EMG nerve conduction in the past has shown mixed axonal peripheral neuropathy and also evidence of right median neuropathy across the wrist consistent with right carpal tunnel syndrome no evidence of lumbosacral radiculopathy.  He is currently on Neurontin 800 mg 3 times daily.  When last seen he was asked to begin Cymbalta 30 mg daily however he only started that 2 weeks ago.  He is having increasing symptoms in his ankles and  soles of the feet.  He has failed Lyrica and nortriptyline in the past.  He denies any falls.  He continues to exercise.  He has a problem with his right knee for which he wears a brace.  He denies any weakness.  He returns for  reevaluation REVIEW OF SYSTEMS: Full 14 system review of systems performed and notable only for those listed, all others are neg:  Constitutional: neg  Cardiovascular: neg Ear/Nose/Throat: neg  Skin: neg Eyes: neg Respiratory: neg Gastroitestinal: neg  Hematology/Lymphatic: neg  Endocrine: neg Musculoskeletal: Joint pain Allergy/Immunology: Environmental allergies Neurological: Numbness, tremors Psychiatric: neg Sleep : neg   ALLERGIES: Allergies  Allergen Reactions  . Other     Muscle relaxant medication-unsure of the name    HOME MEDICATIONS: Outpatient Medications Prior to Visit  Medication Sig Dispense Refill  . acyclovir (ZOVIRAX) 400 MG tablet Take 400 mg by mouth daily as needed. Outbreaks    . ALPRAZolam (XANAX) 0.25 MG tablet Take 0.25 mg by mouth at bedtime.    . cholecalciferol (VITAMIN D) 1000 UNITS tablet Take 2,000 Units by mouth daily with lunch.     . clopidogrel (PLAVIX) 75 MG tablet Take 1 tablet (75 mg total) by mouth daily. 90 tablet 3  . DULoxetine (CYMBALTA) 30 MG capsule Take 1 capsule (30 mg total) by mouth daily. 90 capsule 3  . gabapentin (NEURONTIN) 800 MG tablet Take 1 tablet (800 mg total) by mouth 3 (three) times daily. 270 tablet 3  . omeprazole (PRILOSEC) 40 MG capsule Take 1 capsule (40 mg total) by mouth 2 (two) times daily before a meal. (Patient taking differently: Take 20 mg by mouth daily. ) 60 capsule 1  . prochlorperazine (COMPAZINE) 5 MG tablet Take 2 tablets (10 mg total) by mouth every 6 (six) hours as needed for nausea or vomiting. 30 tablet 1  . promethazine (PHENERGAN) 12.5 MG suppository Place 1 suppository (12.5 mg total) rectally every 6 (six) hours as needed for nausea, vomiting or refractory nausea / vomiting. 12 each 0  . thiamine (VITAMIN B-1) 100 MG tablet Take 100 mg by mouth daily.    Marland Kitchen trimethoprim (TRIMPEX) 100 MG tablet Take 100 mg by mouth daily.  11  . zolpidem (AMBIEN) 10 MG tablet Take 2.5 mg by mouth at bedtime as  needed for sleep.      No facility-administered medications prior to visit.     PAST MEDICAL HISTORY: Past Medical History:  Diagnosis Date  . BPH (benign prostatic hyperplasia)    self caths 1-2 times daily.  . Neuromuscular disorder ([place])    idiopathic,both feet,progressing to legs  . Perforated ulcer ([place]) 1982  . Stroke [facility])    states the Plavix is preventative due to the strong family history of stroke    PAST SURGICAL HISTORY: Past Surgical History:  Procedure Laterality Date  . CHOLECYSTECTOMY N/A 04/07/2016   Procedure: LAPAROSCOPIC CHOLECYSTECTOMY;  Surgeon: Mickeal Skinner, MD;  Location: [facility];  Service: General;  Laterality: N/A;  . ESOPHAGOGASTRODUODENOSCOPY N/A 03/05/2016   Procedure: ESOPHAGOGASTRODUODENOSCOPY (EGD);  Surgeon: Laurence Spates, MD;  Location: [facility];  Service: Endoscopy;  Laterality: N/A;  . HERNIA REPAIR  2012  . TONSILLECTOMY    . TRANSURETHRAL RESECTION OF PROSTATE  2006    FAMILY HISTORY: Family History  Problem Relation Age of Onset  . Stroke Mother   . Diabetes Mother   . Stroke Father   . Diabetes Father   . Diabetes Sister   .  Diabetes Brother     SOCIAL HISTORY: Social History   Socioeconomic History  . Marital status: Married    Spouse name: Vinnie Level  . Number of children: 2  . Years of education: college  . Highest education level: Not on file  Occupational History    Comment: retired  Scientific laboratory technician  . Financial resource strain: Not on file  . Food insecurity:    Worry: Not on file    Inability: Not on file  . Transportation needs:    Medical: Not on file    Non-medical: Not on file  Tobacco Use  . Smoking status: Former Research scientist (life sciences)  . Smokeless tobacco: Never Used  Substance and Sexual Activity  . Alcohol use: Yes    Alcohol/week: 0.6 oz    Types: 1 Glasses of wine per week    Comment: daily  . Drug use: No  . Sexual activity: Not on file  Lifestyle  . Physical activity:    Days  per week: Not on file    Minutes per session: Not on file  . Stress: Not on file  Relationships  . Social connections:    Talks on phone: Not on file    Gets together: Not on file    Attends religious service: Not on file    Active member of club or organization: Not on file    Attends meetings of clubs or organizations: Not on file    Relationship status: Not on file  . Intimate partner violence:    Fear of current or ex partner: Not on file    Emotionally abused: Not on file    Physically abused: Not on file    Forced sexual activity: Not on file  Other Topics Concern  . Not on file  Social History Narrative   Patient is retired, Married for 26 years Vinnie Level).   EducationAdministrator, sports.   Right handed.   Caffeine- five cups daily only tea           PHYSICAL EXAM  Vitals:   08/11/17 0748  Weight: 217 lb 12.8 oz (98.8 kg)  Height: 5' 9.5" (1.765 m)   Body mass index is 31.7 kg/m.  Generalized: Well developed, in no acute distress  Head: normocephalic and atraumatic,. Oropharynx benign  Neck: Supple,  Musculoskeletal: No deformity   Neurological examination   Mentation: Alert oriented to time, place, history taking. Attention span and concentration appropriate. Recent and remote memory intact.  Follows all commands speech and language fluent.   Cranial nerve II-XII: Pupils were equal round reactive to light extraocular movements were full, visual field were full on confrontational test. Facial sensation and strength were normal. hearing was intact to finger rubbing bilaterally. Uvula tongue midline. head turning and shoulder shrug were normal and symmetric.Tongue protrusion into cheek strength was normal. Motor: normal bulk and tone, full strength in the BUE, BLE, fine finger movements normal, no pronator drift. No focal weakness Sensory: Length dependent decreased to light touch pinprick and vibration in the lower extremities Coordination: finger-nose-finger,  heel-to-shin bilaterally, no dysmetria outstretched tremor Reflexes: Absent, plantar responses were flexor bilaterally. Gait and Station: Rising up from seated position without assistance, normal stance,  moderate stride, good arm swing, smooth turning, able to perform tiptoe, and heel walking without difficulty. Tandem gait is unsteady. No assistive device  DIAGNOSTIC DATA (LABS, IMAGING, TESTING) - I reviewed patient records, labs, notes, testing and imaging myself where available.  Lab Results  Component Value Date   WBC 14.1 (H)  06/03/2016   HGB 14.5 06/03/2016   HCT 41.9 06/03/2016   MCV 90.7 06/03/2016   PLT 163 06/03/2016      Component Value Date/Time   NA 134 (L) 06/03/2016 2211   NA 139 03/05/2015 1144   K 3.9 06/03/2016 2211   CL 102 06/03/2016 2211   CO2 22 06/03/2016 2211   GLUCOSE 244 (H) 06/03/2016 2211   BUN 14 06/03/2016 2211   BUN 9 03/05/2015 1144   CREATININE 1.10 06/03/2016 2211   CALCIUM 8.5 (L) 06/03/2016 2211   PROT 7.0 06/03/2016 2211   PROT 6.8 03/05/2015 1144   ALBUMIN 3.8 06/03/2016 2211   ALBUMIN 4.1 03/05/2015 1144   AST 24 06/03/2016 2211   ALT 17 06/03/2016 2211   ALKPHOS 54 06/03/2016 2211   BILITOT 2.5 (H) 06/03/2016 2211   BILITOT 0.5 03/05/2015 1144   GFRNONAA >60 06/03/2016 2211   GFRAA >60 06/03/2016 2211    Lab Results  Component Value Date   HGBA1C 6.1 (H) 03/05/2015   Lab Results  Component Value Date   VITAMINB12 507 03/05/2015   Lab Results  Component Value Date   TSH 0.957 02/28/2016      ASSESSMENT AND PLAN  77 y.o. year old male  has a past medical history of BPH (benign prostatic hyperplasia); Neuromuscular disorder ([place]);  and Stroke ([place]). here To follow-up for progressive ascending length dependent peripheral neuropathy and neuropathic pain which has increased.  Gabapentin has worked better than Lyrica.  He has just started his Cymbalta that was recommended at his last visit.  PLAN: Continue gabapentin at  current dose 800 mg 3 times daily  Just started CYMBALTA 30 MG DAILY  To continue Continue Plavix for secondary stroke prevention Be careful with ambulation at risk for falls due to neuropathy Patient was given some information on neuropathic pain, check feet every day for injury or infection, take medications as prescribed Follow-up 4 months I spent 25 minutes in total face to face time with the patient more than 50% of which was spent counseling and coordination of care, reviewing test results reviewing medications and discussing and reviewing the diagnosis of peripheral neuropathy/neuropathic pain.  Alternative treatments would be acupuncture, massage, pain management program Dennie Bible, [facility], [facility], APRN  [facility] [address] [place], [place] [address] [phone]

## 2017-08-11 ENCOUNTER — Encounter: Payer: Self-pay | Admitting: Nurse Practitioner

## 2017-08-11 ENCOUNTER — Ambulatory Visit: Payer: Medicare Other | Admitting: Nurse Practitioner

## 2017-08-11 VITALS — BP 147/80 | HR 69 | Ht 69.5 in | Wt 217.8 lb

## 2017-08-11 DIAGNOSIS — G609 Hereditary and idiopathic neuropathy, unspecified: Secondary | ICD-10-CM | POA: Diagnosis not present

## 2017-08-11 DIAGNOSIS — Z8673 Personal history of transient ischemic attack (TIA), and cerebral infarction without residual deficits: Secondary | ICD-10-CM | POA: Diagnosis not present

## 2017-08-11 DIAGNOSIS — G250 Essential tremor: Secondary | ICD-10-CM | POA: Diagnosis not present

## 2017-08-11 NOTE — Patient Instructions (Addendum)
Continue gabapentin at current dose 800 mg 3 times daily will refill Just started CYMBALTA 30 MG DAILY  To continue Continue Plavix for secondary stroke prevention Be careful with ambulation at risk for falls due to neuropathy Patient was given some information on peripheral neuropathy, check feet every day for injury or infection, take medications as prescribed Follow-up 4 months  Neuropathic Pain Neuropathic pain is pain caused by damage to the nerves that are responsible for certain sensations in your body (sensory nerves). The pain can be caused by damage to:  The sensory nerves that send signals to your spinal cord and brain (peripheral nervous system).  The sensory nerves in your brain or spinal cord (central nervous system).  Neuropathic pain can make you more sensitive to pain. What would be a minor sensation for most people may feel very painful if you have neuropathic pain. This is usually a long-term condition that can be difficult to treat. The type of pain can differ from person to person. It may start suddenly (acute), or it may develop slowly and last for a long time (chronic). Neuropathic pain may come and go as damaged nerves heal or may stay at the same level for years. It often causes emotional distress, loss of sleep, and a lower quality of life. What are the causes? The most common cause of damage to a sensory nerve is diabetes. Many other diseases and conditions can also cause neuropathic pain. Causes of neuropathic pain can be classified as:  Toxic. Many drugs and chemicals can cause toxic damage. The most common cause of toxic neuropathic pain is damage from drug treatment for cancer (chemotherapy).  Metabolic. This type of pain can happen when a disease causes imbalances that damage nerves. Diabetes is the most common of these diseases. Vitamin B deficiency caused by long-term alcohol abuse is another common cause.  Traumatic. Any injury that cuts, crushes, or stretches  a nerve can cause damage and pain. A common example is feeling pain after losing an arm or leg (phantom limb pain).  Compression-related. If a sensory nerve gets trapped or compressed for a long period of time, the blood supply to the nerve can be cut off.  Vascular. Many blood vessel diseases can cause neuropathic pain by decreasing blood supply and oxygen to nerves.  Autoimmune. This type of pain results from diseases in which the body's defense system mistakenly attacks sensory nerves. Examples of autoimmune diseases that can cause neuropathic pain include lupus and multiple sclerosis.  Infectious. Many types of viral infections can damage sensory nerves and cause pain. Shingles infection is a common cause of this type of pain.  Inherited. Neuropathic pain can be a symptom of many diseases that are passed down through families (genetic).  What are the signs or symptoms? The main symptom is pain. Neuropathic pain is often described as:  Burning.  Shock-like.  Stinging.  Hot or cold.  Itching.  How is this diagnosed? No single test can diagnose neuropathic pain. Your health care provider will do a physical exam and ask you about your pain. You may use a pain scale to describe how bad your pain is. You may also have tests to see if you have a high sensitivity to pain and to help find the cause and location of any sensory nerve damage. These tests may include:  Imaging studies, such as: ? X-rays. ? CT scan. ? MRI.  Nerve conduction studies to test how well nerve signals travel through your sensory nerves (electrodiagnostic  testing).  Stimulating your sensory nerves through electrodes on your skin and measuring the response in your spinal cord and brain (somatosensory evoked potentials).  How is this treated? Treatment for neuropathic pain may change over time. You may need to try different treatment options or a combination of treatments. Some options include:  Over-the-counter  pain relievers.  Prescription medicines. Some medicines used to treat other conditions may also help neuropathic pain. These include medicines to: ? Control seizures (anticonvulsants). ? Relieve depression (antidepressants).  Prescription-strength pain relievers (narcotics). These are usually used when other pain relievers do not help.  Transcutaneous nerve stimulation (TENS). This uses electrical currents to block painful nerve signals. The treatment is painless.  Topical and local anesthetics. These are medicines that numb the nerves. They can be injected as a nerve block or applied to the skin.  Alternative treatments, such as: ? Acupuncture. ? Meditation. ? Massage. ? Physical therapy. ? Pain management programs. ? Counseling.  Follow these instructions at home:  Learn as much as you can about your condition.  Take medicines only as directed by your health care provider.  Work closely with all your health care providers to find what works best for you.  Have a good support system at home.  Consider joining a chronic pain support group. Contact a health care provider if:  Your pain treatments are not helping.  You are having side effects from your medicines.  You are struggling with fatigue, mood changes, depression, or anxiety. This information is not intended to replace advice given to you by your health care provider. Make sure you discuss any questions you have with your health care provider. Document Released: 01/24/2004 Document Revised: 11/16/2015 Document Reviewed: 10/06/2013 Elsevier Interactive Patient Education  [employer].

## 2017-11-10 IMAGING — CR DG CHEST 2V
2 series · 2 of 2 positions shown · non-contrast
Comparison: Chest radiograph performed 08/13/2009

CLINICAL DATA: Acute onset of generalized weakness, shortness of
breath, nausea, vomiting and diarrhea. Initial encounter.

EXAM:
CHEST  2 VIEW

[w chest pa]
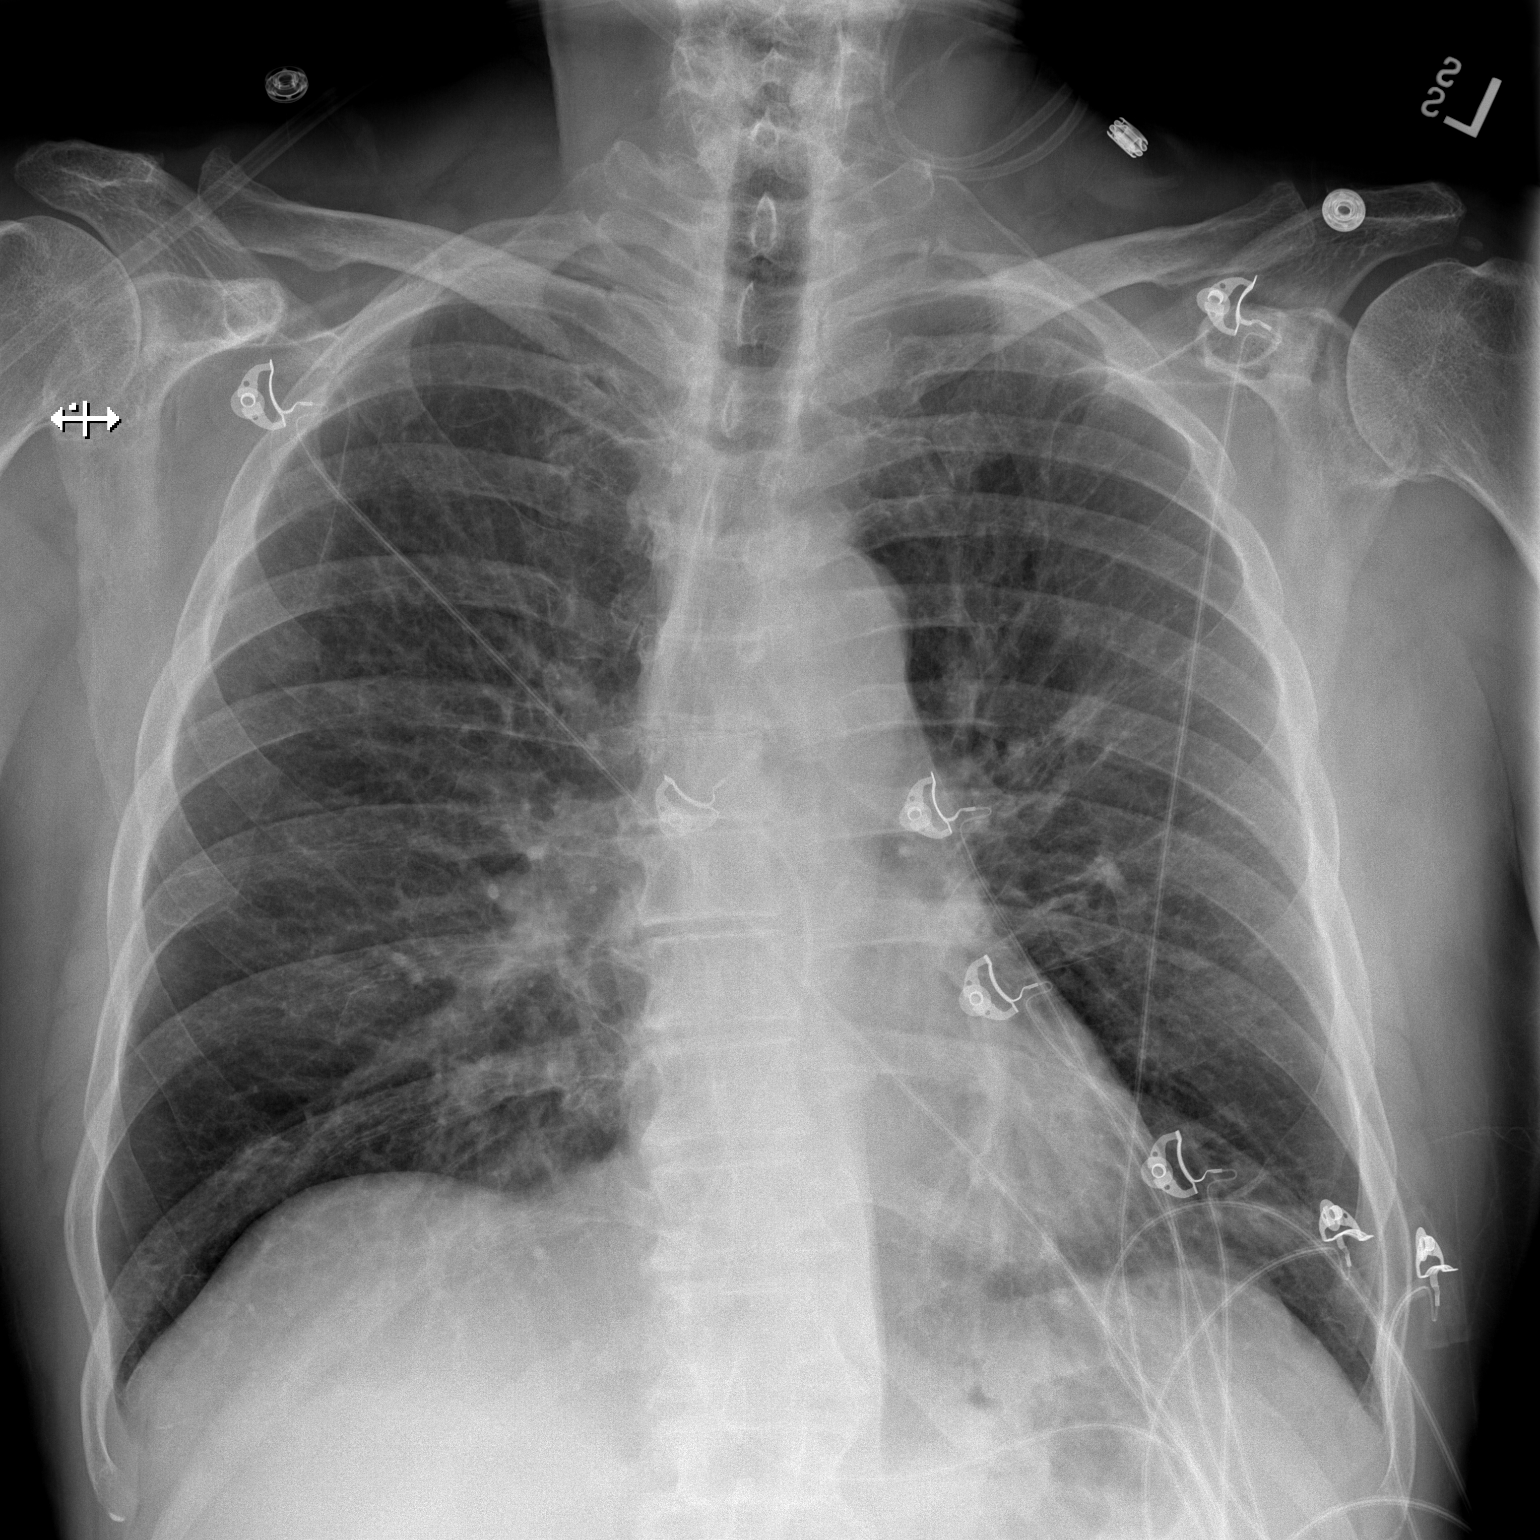

[w chest lat]
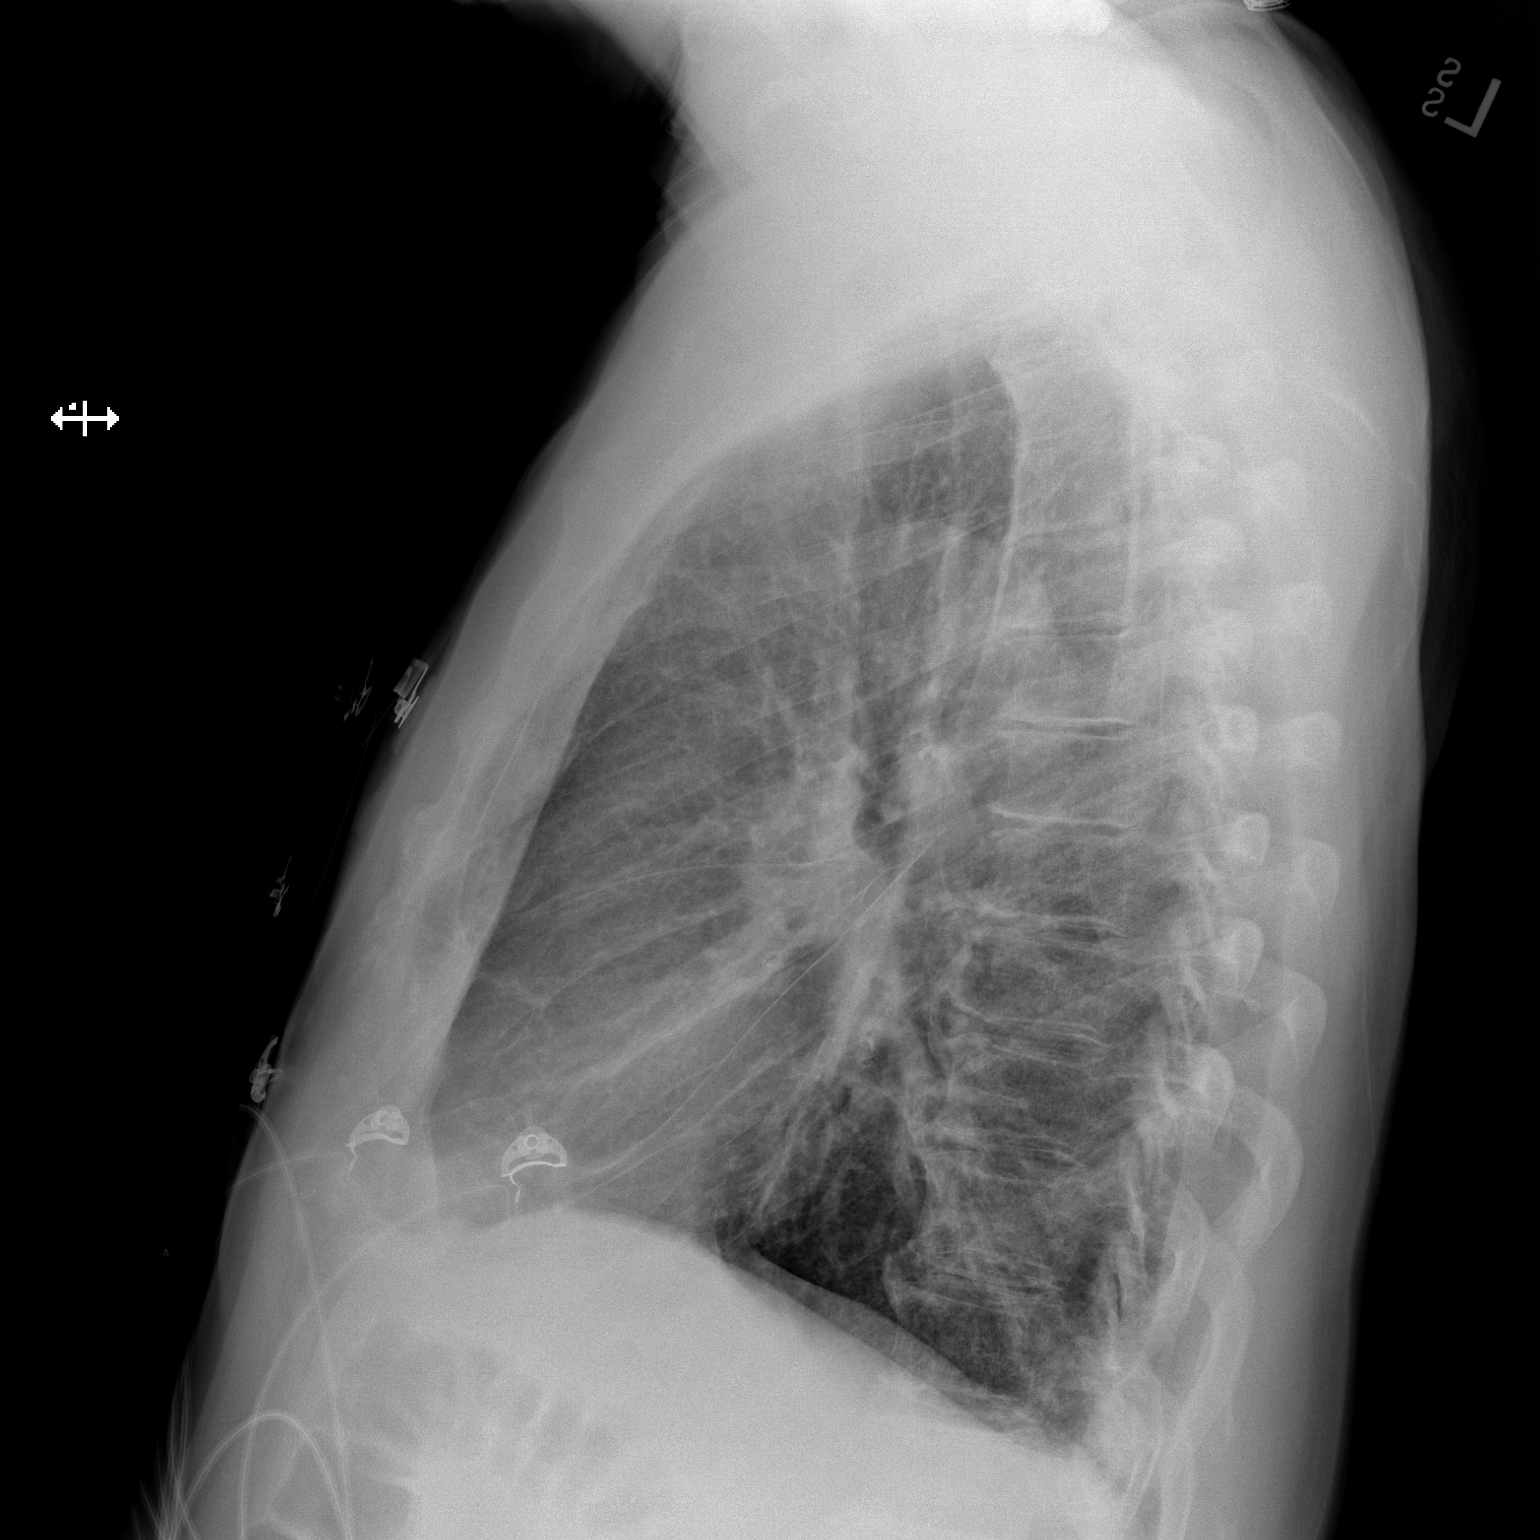

[2 of 2 positions shown; findings below may reference images not displayed]

FINDINGS: The lungs are well-aerated. Vascular congestion is noted. Mild
peribronchial thickening is seen. There is no evidence of pleural
effusion or pneumothorax.

The heart is normal in size; the mediastinal contour is within
normal limits. No acute osseous abnormalities are seen.
IMPRESSION: Vascular congestion noted.  Mild peribronchial thickening seen.

## 2017-11-14 IMAGING — DX DG CHEST 1V PORT
1 series · 2 of 2 positions shown · non-contrast
Comparison: 02/27/2016

CLINICAL DATA: Hypoxia

EXAM:
PORTABLE CHEST 1 VIEW

[Series 1: chest ap · 0.14mm/px · 2 of 2 slices shown]
[im 1/2]
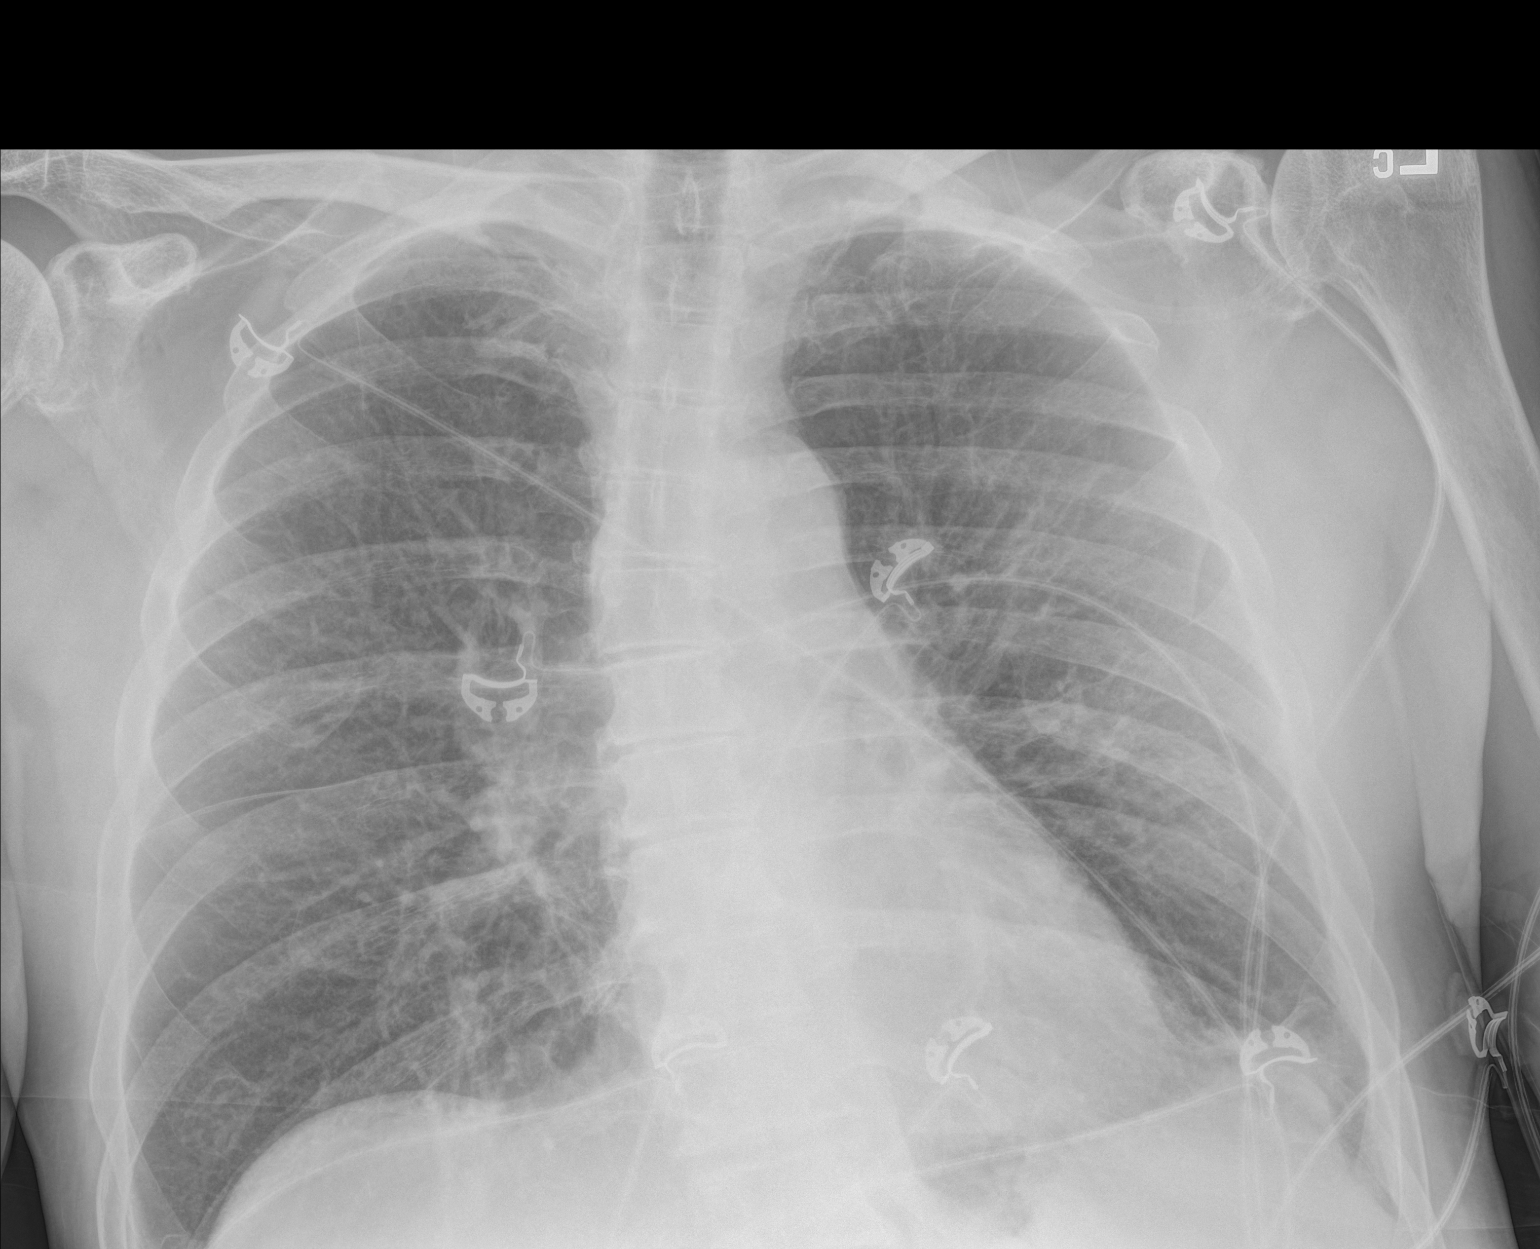
[im 2/2]
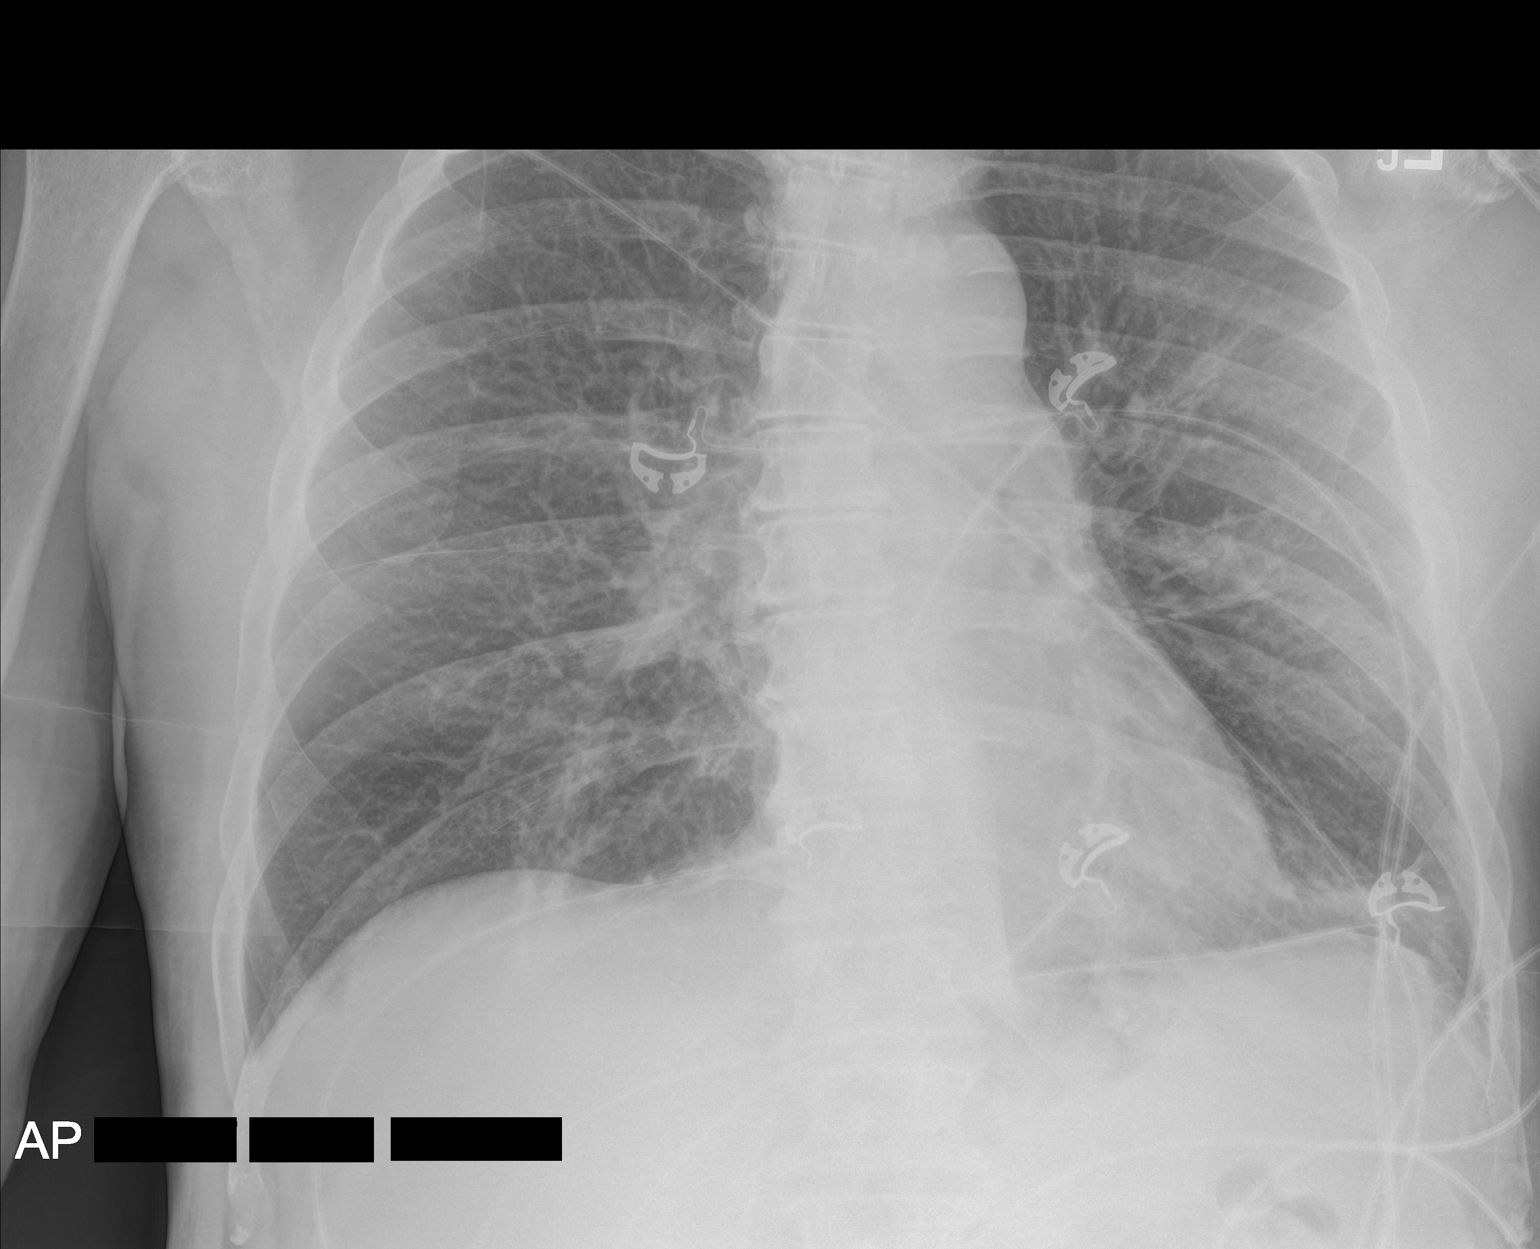

[2 of 2 positions shown; findings below may reference images not displayed]

FINDINGS: Cardiac shadow is within normal limits. The lungs are well aerated
bilaterally. Mild interstitial changes are noted without focal
confluent infiltrate. No sizable effusion is seen. No bony
abnormality is noted.
IMPRESSION: Mild interstitial changes without acute abnormality.

## 2017-11-15 IMAGING — NM NM HEPATO W/GB/PHARM/[PERSON_NAME]
1 series · 12 of 12 positions shown · non-contrast
Comparison: None.

CLINICAL DATA: Cholelithiasis. Abdominal pain. Elevated liver
function test. Sepsis.

EXAM:
NUCLEAR MEDICINE HEPATOBILIARY IMAGING WITH GALLBLADDER EF
TECHNIQUE: Sequential images of the abdomen were obtained [DATE] minutes
following intravenous administration of radiopharmaceutical. After
slow intravenous infusion of 1.9 micrograms Cholecystokinin,
gallbladder ejection fraction was determined.
RADIOPHARMACEUTICALS:  7.7 mCi Ic-66m Choletec IV

[Series 1: hepato · 4.46mm/px · 2 acquisitions, 12 frames shown]
[im 1/2]
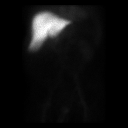
[im 1/2]
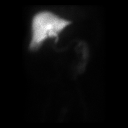
[im 1/2]
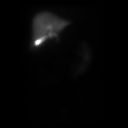
[im 1/2]
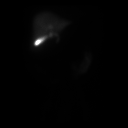
[im 1/2]
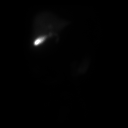
[im 1/2]
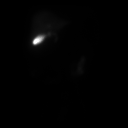
[im 2/2]
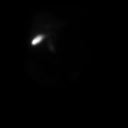
[im 2/2]
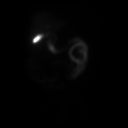
[im 2/2]
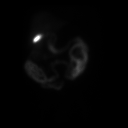
[im 2/2]
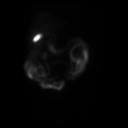
[im 2/2]
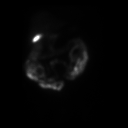
[im 2/2]
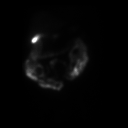

[12 of 12 positions shown; findings below may reference images not displayed]

FINDINGS: Prompt uptake and biliary excretion of activity by the liver is
seen. Gallbladder activity is visualized, consistent with patency of
cystic duct. Biliary activity passes into small bowel, consistent
with patent common bile duct.

Calculated gallbladder ejection fraction is 77%. (At 60 min, normal
ejection fraction is greater than 40%.)
IMPRESSION: Normal hepatobiliary imaging. Patency of both cystic and common bile
ducts demonstrated.

Normal gallbladder ejection fraction of 77%.

## 2017-12-02 ENCOUNTER — Ambulatory Visit: Payer: Medicare Other | Admitting: Nurse Practitioner

## 2017-12-14 NOTE — Progress Notes (Signed)
[facility]  PATIENT: David Li DOB: [birth year]   REASON FOR VISIT: Follow-up for neuropathy which has worsened, history of stroke HISTORY FROM: Patient    HISTORY OF PRESENT ILLNESS:David Li, is a 77 yo white male return for follow up of idiopathic peripheral neuropathy.   He was followed by Dr. Doy Mince since April 2006, when he presented with sensory changes starting at the soles of his feet, gradually ascending to bilateral lower extremities, fairly symmetric. NCV and spine imaging were negative then.  In August 2006 he was hospitalized with fever and myalgias, and temporal artery biopsy demonstrated vasculitis. He has since been treated with methotrexate and prednisone, with good response of vasculitis. He is no longer on immunosuppressive treatment.  His neuropathy has progressed by exam and followup NCV. Symptoms have been fairly well controlled on gabapentin 600 mg 3 times a day and nortriptyline 25 mg every night. He had a few bouts of double vision a few years ago, thought likely due to vasculitis. He had a spell of dizziness in 2008, negative imaging at that time, seen in the ER by Dr. Erling Cruz and treated with Plavix for possible TIA.   Last visit was in October 2015 by nurse practitioner [name], he continued to complains of slow progression of numbness, tingling, neuropathic pain, now numbness at both mid thigh level. 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, enjoys reading and learning   He is taking titrating dose of gabapentin, currently gabapentin 600 mg 3 times a day, nortriptyline 25 mg 1 tablet at night  UPDATE May 28 2015:YY He continues to complains of burning at the bottom of his feet, he is taking Lyrica, which seems to help, he is planning on to move to gabapentin  600mg  tid to see which one helps him better He has mild low back pain,no radiating pain to his legs, he has to self cath once a day since 2016, this happened following his previous TURP surgery, he has difficulty completely empty his bladder, he also complains of worsening constipation.  He feel hand swollen, tightness, he complains of worsening hand writing. CSF showed total elevated total protein 63, normal glucose, normal cells  EMG nerve conduction study January 2017 showed mild axonal peripheral neuropathy, no evidence of demyelinating features  UPDATE July 18th 2017:YY He continue complains of intermittent bilateral feet and hand low-grade paresthesia, despite taking gabapentin 600 mg 3 times a day, denied weakness, mildly difficulty with tandem walking  UPDATE 07/23/2018CM DavidZanni, 77 year old male returns for follow-up with history of peripheral neuropathy. EMG nerve conduction in January 2017 showed mixed axonal peripheral neuropathy  He is currently on gabapentin 800 mg 3 times daily. He  continues to complain of burning on the soles of his feet. He denies any falls. He continues to exercise. He returns for reevaluation UPDATE 4/2/2019CM DavidLevan, 77 year old male returns for follow-up with worsening peripheral neuropathy.  EMG nerve conduction in the past has shown mixed axonal peripheral neuropathy and also evidence of right median neuropathy across the wrist consistent with right carpal tunnel syndrome no evidence of lumbosacral radiculopathy.  He is currently on Neurontin 800 mg 3 times daily.  When last seen he was asked to begin Cymbalta 30 mg daily however he only started that 2 weeks ago.  He is having increasing symptoms in his ankles and  soles of the feet.  He has failed Lyrica and nortriptyline in the past.  He denies any falls.  He continues to exercise.  He has a problem with his right knee for which he wears a brace.  He denies any weakness.  He returns for  reevaluation UPDATE 8/6/2019CM [name], 77 year old male returns for follow-up with worsening peripheral neuropathy.  He was diagnosed in 2006.  Cymbalta was  added to his regimen he is currently taking 30 mg daily.  He is on Neurontin 800 mg 3 times daily.  EMG nerve conduction in the past has shown a mixed axonal peripheral neuropathy and also evidence of right median neuropathy across the wrist consistent with right carpal tunnel syndrome no evidence of lumbosacral radiculopathy.  He claims he continues to have constant symptoms in his feet and ankles"burning".  He tries to exercise.  He does do strengthening exercises.  He is asking for referral to Dr. Nelva Bush for pain management he returns for reevaluation REVIEW OF SYSTEMS: Full 14 system review of systems performed and notable only for those listed, all others are neg:  Constitutional: neg  Cardiovascular: neg Ear/Nose/Throat: neg  Skin: neg Eyes: neg Respiratory: neg Gastroitestinal: neg  Hematology/Lymphatic: neg  Endocrine: neg Musculoskeletal: Joint pain, walking difficulty Allergy/Immunology: Environmental allergies Neurological: Numbness, tremors, paresthesias Psychiatric: neg Sleep : neg   ALLERGIES: Allergies  Allergen Reactions  . Amoxicillin Nausea Only  . Macrobid  [Nitrofurantoin Macrocrystal]   . Other     Muscle relaxant medication-unsure of the name    HOME MEDICATIONS: Outpatient Medications Prior to Visit  Medication Sig Dispense Refill  . acyclovir (ZOVIRAX) 400 MG tablet Take 400 mg by mouth daily as needed. Outbreaks    . ALPRAZolam (XANAX) 0.25 MG tablet Take 0.25 mg by mouth daily as needed.     . Azelastine HCl 0.15 % SOLN SPRAY 1 SPRAY INTO EACH NOSTRIL TWICE A DAY  3  . cholecalciferol (VITAMIN D) 1000 UNITS tablet Take 2,000 Units by mouth daily with lunch.     . clopidogrel (PLAVIX) 75 MG tablet Take 1 tablet (75 mg total) by mouth daily. 90 tablet 3  . DULoxetine (CYMBALTA) 30 MG capsule Take 1  capsule (30 mg total) by mouth daily. 90 capsule 3  . gabapentin (NEURONTIN) 800 MG tablet Take 1 tablet (800 mg total) by mouth 3 (three) times daily. 270 tablet 3  . omeprazole (PRILOSEC) 40 MG capsule Take 1 capsule (40 mg total) by mouth 2 (two) times daily before a meal. (Patient taking differently: Take 20 mg by mouth daily. ) 60 capsule 1  . prochlorperazine (COMPAZINE) 5 MG tablet Take 2 tablets (10 mg total) by mouth every 6 (six) hours as needed for nausea or vomiting. 30 tablet 1  . promethazine (PHENERGAN) 12.5 MG suppository Place 1 suppository (12.5 mg total) rectally every 6 (six) hours as needed for nausea, vomiting or refractory nausea / vomiting. 12 each 0  . thiamine (VITAMIN B-1) 100 MG tablet Take 100 mg by mouth daily.    Marland Kitchen trimethoprim (TRIMPEX) 100 MG tablet Take 100 mg by mouth daily.  11  . zolpidem (AMBIEN) 10 MG tablet Take 2.5 mg by mouth at bedtime as needed for sleep.      No facility-administered medications prior to visit.     PAST MEDICAL HISTORY: Past Medical History:  Diagnosis Date  . BPH (benign prostatic hyperplasia)    self caths 1-2 times daily.  . Neuromuscular disorder ([place])  idiopathic,both feet,progressing to legs  . Perforated ulcer ([place]) 1982  . Stroke [facility])    states the Plavix is preventative due to the strong family history of stroke    PAST SURGICAL HISTORY: Past Surgical History:  Procedure Laterality Date  . CHOLECYSTECTOMY N/A 04/07/2016   Procedure: LAPAROSCOPIC CHOLECYSTECTOMY;  Surgeon: Mickeal Skinner, MD;  Location: [facility];  Service: General;  Laterality: N/A;  . ESOPHAGOGASTRODUODENOSCOPY N/A 03/05/2016   Procedure: ESOPHAGOGASTRODUODENOSCOPY (EGD);  Surgeon: Laurence Spates, MD;  Location: [facility];  Service: Endoscopy;  Laterality: N/A;  . HERNIA REPAIR  2012  . TONSILLECTOMY    . TRANSURETHRAL RESECTION OF PROSTATE  2006    FAMILY HISTORY: Family History  Problem Relation Age of Onset  .  Stroke Mother   . Diabetes Mother   . Stroke Father   . Diabetes Father   . Diabetes Sister   . Diabetes Brother     SOCIAL HISTORY: Social History   Socioeconomic History  . Marital status: Married    Spouse name: Vinnie Level  . Number of children: 2  . Years of education: college  . Highest education level: Not on file  Occupational History    Comment: retired  Scientific laboratory technician  . Financial resource strain: Not on file  . Food insecurity:    Worry: Not on file    Inability: Not on file  . Transportation needs:    Medical: Not on file    Non-medical: Not on file  Tobacco Use  . Smoking status: Former Research scientist (life sciences)  . Smokeless tobacco: Never Used  Substance and Sexual Activity  . Alcohol use: Yes    Alcohol/week: 0.6 oz    Types: 1 Glasses of wine per week    Comment: daily  . Drug use: No  . Sexual activity: Not on file  Lifestyle  . Physical activity:    Days per week: Not on file    Minutes per session: Not on file  . Stress: Not on file  Relationships  . Social connections:    Talks on phone: Not on file    Gets together: Not on file    Attends religious service: Not on file    Active member of club or organization: Not on file    Attends meetings of clubs or organizations: Not on file    Relationship status: Not on file  . Intimate partner violence:    Fear of current or ex partner: Not on file    Emotionally abused: Not on file    Physically abused: Not on file    Forced sexual activity: Not on file  Other Topics Concern  . Not on file  Social History Narrative   Patient is retired, Married for 26 years Vinnie Level).   EducationAdministrator, sports.   Right handed.   Caffeine- five cups daily only tea           PHYSICAL EXAM  Vitals:   12/15/17 0948  BP: 118/70  Pulse: 76  Weight: 208 lb (94.3 kg)  Height: 5' 9.5" (1.765 m)   Body mass index is 30.28 kg/m.  Generalized: Well developed, in no acute distress  Head: normocephalic and atraumatic,. Oropharynx  benign  Neck: Supple,  Musculoskeletal: No deformity   Neurological examination   Mentation: Alert oriented to time, place, history taking. Attention span and concentration appropriate. Recent and remote memory intact.  Follows all commands speech and language fluent.   Cranial nerve II-XII: Pupils were equal round reactive to light  extraocular movements were full, visual field were full on confrontational test. Facial sensation and strength were normal. hearing was intact to finger rubbing bilaterally. Uvula tongue midline. head turning and shoulder shrug were normal and symmetric.Tongue protrusion into cheek strength was normal. Motor: normal bulk and tone, full strength in the BUE, BLE, fine finger movements normal, no pronator drift. No focal weakness Sensory: Length dependent decreased to light touch pinprick and vibration in the lower extremities to the knees Coordination: finger-nose-finger, heel-to-shin bilaterally, no dysmetria outstretched tremor Reflexes: Absent, plantar responses were flexor bilaterally. Gait and Station: Rising up from seated position without assistance, normal stance,  moderate stride, good arm swing, smooth turning, able to perform tiptoe, and heel walking without difficulty. Tandem gait is unsteady. No assistive device  DIAGNOSTIC DATA (LABS, IMAGING, TESTING) - I reviewed patient records, labs, notes, testing and imaging myself where available.  Lab Results  Component Value Date   WBC 14.1 (H) 06/03/2016   HGB 14.5 06/03/2016   HCT 41.9 06/03/2016   MCV 90.7 06/03/2016   PLT 163 06/03/2016      Component Value Date/Time   NA 134 (L) 06/03/2016 2211   NA 139 03/05/2015 1144   K 3.9 06/03/2016 2211   CL 102 06/03/2016 2211   CO2 22 06/03/2016 2211   GLUCOSE 244 (H) 06/03/2016 2211   BUN 14 06/03/2016 2211   BUN 9 03/05/2015 1144   CREATININE 1.10 06/03/2016 2211   CALCIUM 8.5 (L) 06/03/2016 2211   PROT 7.0 06/03/2016 2211   PROT 6.8 03/05/2015  1144   ALBUMIN 3.8 06/03/2016 2211   ALBUMIN 4.1 03/05/2015 1144   AST 24 06/03/2016 2211   ALT 17 06/03/2016 2211   ALKPHOS 54 06/03/2016 2211   BILITOT 2.5 (H) 06/03/2016 2211   BILITOT 0.5 03/05/2015 1144   GFRNONAA >60 06/03/2016 2211   GFRAA >60 06/03/2016 2211    Lab Results  Component Value Date   HGBA1C 6.1 (H) 03/05/2015   Lab Results  Component Value Date   VITAMINB12 507 03/05/2015   Lab Results  Component Value Date   TSH 0.957 02/28/2016      ASSESSMENT AND PLAN  77 y.o. year old male  has a past medical history of BPH (benign prostatic hyperplasia); Neuromuscular disorder ([place]);  and Stroke ([place]). here To follow-up for progressive ascending length dependent peripheral neuropathy and neuropathic pain which has increased.  Gabapentin has worked better than Lyrica.  Cymbalta recently added will increase to 60 mg.  Patient is asking for pain referral to Dr. Nelva Bush   PLAN: Continue gabapentin at current dose 800 mg 3 times daily  Continue Cymbalta but increase to 60mg   Continue Plavix for secondary stroke prevention Be careful with ambulation at risk for falls due to neuropathy Will refer to pain management Dr. Nelva Bush Follow-up 6 months I spent 25 minutes in total face to face time with the patient more than 50% of which was spent counseling and coordination of care, reviewing test results reviewing medications and discussing and reviewing the diagnosis of peripheral neuropathy/neuropathic pain.  Alternative treatments would be acupuncture, massage, pain management program Dennie Bible, [facility], [facility], APRN  [facility] [address] [place], [place] [address] [phone]

## 2017-12-15 ENCOUNTER — Ambulatory Visit: Payer: Medicare Other | Admitting: Nurse Practitioner

## 2017-12-15 ENCOUNTER — Encounter: Payer: Self-pay | Admitting: Nurse Practitioner

## 2017-12-15 VITALS — BP 118/70 | HR 76 | Ht 69.5 in | Wt 208.0 lb

## 2017-12-15 DIAGNOSIS — M792 Neuralgia and neuritis, unspecified: Secondary | ICD-10-CM

## 2017-12-15 DIAGNOSIS — Z8673 Personal history of transient ischemic attack (TIA), and cerebral infarction without residual deficits: Secondary | ICD-10-CM | POA: Diagnosis not present

## 2017-12-15 DIAGNOSIS — G609 Hereditary and idiopathic neuropathy, unspecified: Secondary | ICD-10-CM

## 2017-12-15 DIAGNOSIS — R202 Paresthesia of skin: Secondary | ICD-10-CM | POA: Diagnosis not present

## 2017-12-15 MED ORDER — DULOXETINE HCL 60 MG PO CPEP: 60.0000 mg | ORAL_CAPSULE | Freq: Every day | ORAL | 6 refills | Status: DC

## 2017-12-15 NOTE — Patient Instructions (Signed)
Continue gabapentin at current dose 800 mg 3 times daily  Continue Cymbalta but increase to 60mg   Continue Plavix for secondary stroke prevention Be careful with ambulation at risk for falls due to neuropathy Will refer to pain management Dr. Nelva Bush per pt request Follow-up 6 months

## 2017-12-15 NOTE — Progress Notes (Signed)
I have reviewed and agreed above plan. 

## 2017-12-28 DIAGNOSIS — G5601 Carpal tunnel syndrome, right upper limb: Secondary | ICD-10-CM | POA: Insufficient documentation

## 2018-01-14 ENCOUNTER — Other Ambulatory Visit: Payer: Self-pay | Admitting: Nurse Practitioner

## 2018-01-15 ENCOUNTER — Other Ambulatory Visit: Payer: Self-pay | Admitting: Nurse Practitioner

## 2018-01-25 ENCOUNTER — Encounter (HOSPITAL_COMMUNITY): Payer: Self-pay | Admitting: Emergency Medicine

## 2018-01-25 ENCOUNTER — Emergency Department (HOSPITAL_COMMUNITY)
Admission: EM | Admit: 2018-01-25 | Discharge: 2018-01-26 | Disposition: A | Discharge status: Home / Self Care | Payer: Medicare Other | Attending: Emergency Medicine | Admitting: Emergency Medicine

## 2018-01-25 DIAGNOSIS — Z87891 Personal history of nicotine dependence: Secondary | ICD-10-CM | POA: Diagnosis not present

## 2018-01-25 DIAGNOSIS — Z79899 Other long term (current) drug therapy: Secondary | ICD-10-CM | POA: Diagnosis not present

## 2018-01-25 DIAGNOSIS — Z8673 Personal history of transient ischemic attack (TIA), and cerebral infarction without residual deficits: Secondary | ICD-10-CM | POA: Diagnosis not present

## 2018-01-25 DIAGNOSIS — R112 Nausea with vomiting, unspecified: Secondary | ICD-10-CM | POA: Diagnosis present

## 2018-01-25 DIAGNOSIS — Z7901 Long term (current) use of anticoagulants: Secondary | ICD-10-CM | POA: Insufficient documentation

## 2018-01-25 LAB — CBC WITH DIFFERENTIAL/PLATELET
BASOS ABS: 0 10*3/uL (ref 0.0–0.1)
BASOS PCT: 0 %
EOS ABS: 0 10*3/uL (ref 0.0–0.7)
EOS PCT: 0 %
HCT: 51.4 % (ref 39.0–52.0)
Hemoglobin: 17.6 g/dL — ABNORMAL HIGH (ref 13.0–17.0)
Lymphocytes Relative: 8 %
Lymphs Abs: 1.4 10*3/uL (ref 0.7–4.0)
MCH: 31.8 pg (ref 26.0–34.0)
MCHC: 34.2 g/dL (ref 30.0–36.0)
MCV: 92.9 fL (ref 78.0–100.0)
MONO ABS: 1.3 10*3/uL — AB (ref 0.1–1.0)
Monocytes Relative: 8 %
NEUTROS ABS: 13.4 10*3/uL — AB (ref 1.7–7.7)
Neutrophils Relative %: 84 %
Platelets: 338 10*3/uL (ref 150–400)
RBC: 5.53 MIL/uL (ref 4.22–5.81)
RDW: 14.4 % (ref 11.5–15.5)
WBC: 16 10*3/uL — ABNORMAL HIGH (ref 4.0–10.5)

## 2018-01-25 LAB — COMPREHENSIVE METABOLIC PANEL
ALBUMIN: 4.5 g/dL (ref 3.5–5.0)
ALT: 35 U/L (ref 0–44)
ANION GAP: 17 — AB (ref 5–15)
AST: 35 U/L (ref 15–41)
Alkaline Phosphatase: 85 U/L (ref 38–126)
BUN: 24 mg/dL — AB (ref 8–23)
CHLORIDE: 95 mmol/L — AB (ref 98–111)
CO2: 27 mmol/L (ref 22–32)
Calcium: 10.1 mg/dL (ref 8.9–10.3)
Creatinine, Ser: 1.35 mg/dL — ABNORMAL HIGH (ref 0.61–1.24)
GFR calc Af Amer: 58 mL/min — ABNORMAL LOW (ref >60)
GFR calc non Af Amer: 50 mL/min — ABNORMAL LOW (ref >60)
GLUCOSE: 210 mg/dL — AB (ref 70–99)
POTASSIUM: 4.6 mmol/L (ref 3.5–5.1)
SODIUM: 139 mmol/L (ref 135–145)
Total Bilirubin: 1.6 mg/dL — ABNORMAL HIGH (ref 0.3–1.2)
Total Protein: 9 g/dL — ABNORMAL HIGH (ref 6.5–8.1)

## 2018-01-25 LAB — LIPASE, BLOOD: Lipase: 25 U/L (ref 11–51)

## 2018-01-25 MED ORDER — PROCHLORPERAZINE EDISYLATE 10 MG/2ML IJ SOLN
10.0000 mg | Freq: Once | INTRAMUSCULAR | Status: AC
  Administered 2018-01-25: 10 mg via INTRAVENOUS
  Filled 2018-01-25: qty 2

## 2018-01-25 MED ORDER — SODIUM CHLORIDE 0.9 % IV BOLUS
1000.0000 mL | Freq: Once | INTRAVENOUS | Status: AC
  Administered 2018-01-25: 1000 mL via INTRAVENOUS

## 2018-01-25 NOTE — ED Notes (Signed)
Bed: WA21 Expected date:  Expected time:  Means of arrival:  Comments: EMS 77 yo male from home nausea and vomiting since 0900

## 2018-01-25 NOTE — ED Triage Notes (Signed)
Pt is on blood thinner/ took promethazine suppository and compazine with out relief at 6pm verbalized/

## 2018-01-25 NOTE — ED Provider Notes (Signed)
[place] DEPT Provider Note   CSN: [id number] Arrival date & time: 01/25/18  2150     History   Chief Complaint Chief Complaint  Patient presents with  . Nausea  . Emesis    HPI David Li is a 77 y.o. male.  Patient to ED with severe nausea with >20 episodes non-bloody emesis since this morning. Symptoms started around 2:30 am with abdominal distention and non-radiating pain across the mid-abdomen. The pain has resolved but nausea that started around 8:00 am has been persistent despite the use of Compazine this morning and Phenergan at 6:00 pm. No fever, chest pain, SOB. He has had similar episodes before and reports it feels the same as an allergic reaction he has when he takes Macrobid or Amoxil - both of which he has at home and can't be certain one did not get mixed in with his regular medications.   The history is provided by the patient. No language interpreter was used.    Past Medical History:  Diagnosis Date  . BPH (benign prostatic hyperplasia)    self caths 1-2 times daily.  . Neuromuscular disorder ([place])    idiopathic,both feet,progressing to legs  . Perforated ulcer ([place]) 1982  . Stroke [facility])    states the Plavix is preventative due to the strong family history of stroke    Patient Active Problem List   Diagnosis Date Noted  . Neuropathic pain 12/15/2017  . History of stroke 12/01/2016  . Cholelithiasis   . Sepsis ([place]) 03/02/2016  . Prolonged Q-T interval on ECG 03/02/2016  . Hypokalemia 03/02/2016  . Neurogenic bladder 03/02/2016  . Pneumonia 02/28/2016  . Acute respiratory failure with hypoxia ([place]) 02/28/2016  . Nausea without vomiting 02/28/2016  . Acute respiratory failure ([place]) 02/28/2016  . Low back pain 05/28/2015  . Paresthesia 05/28/2015  . Tremor, essential 03/02/2014  . Hereditary and idiopathic peripheral neuropathy 03/01/2013  . Vasculitis ([place]) 03/01/2013    Past Surgical History:  Procedure  Laterality Date  . CHOLECYSTECTOMY N/A 04/07/2016   Procedure: LAPAROSCOPIC CHOLECYSTECTOMY;  Surgeon: Mickeal Skinner, MD;  [facility];  Service: General;  Laterality: N/A;  . ESOPHAGOGASTRODUODENOSCOPY N/A 03/05/2016   Procedure: ESOPHAGOGASTRODUODENOSCOPY (EGD);  Surgeon: Laurence Spates, MD;  Location: [facility];  Service: Endoscopy;  Laterality: N/A;  . HERNIA REPAIR  2012  . TONSILLECTOMY    . TRANSURETHRAL RESECTION OF PROSTATE  2006        Home Medications    Prior to Admission medications   Medication Sig Start Date End Date Taking? Authorizing Provider  acyclovir (ZOVIRAX) 400 MG tablet Take 400 mg by mouth daily as needed. Outbreaks    [provider]  ALPRAZolam (XANAX) 0.25 MG tablet Take 0.25 mg by mouth daily as needed.     [provider]  Azelastine HCl 0.15 % SOLN SPRAY 1 SPRAY INTO EACH NOSTRIL TWICE A DAY 10/04/17   [provider]  cholecalciferol (VITAMIN D) 1000 UNITS tablet Take 2,000 Units by mouth daily with lunch.     [provider]  clopidogrel (PLAVIX) 75 MG tablet TAKE 1 TABLET BY MOUTH EVERY DAY 01/15/18   Dennie Bible, NP  DULoxetine (CYMBALTA) 60 MG capsule TAKE 1 CAPSULE BY MOUTH EVERY DAY 01/14/18   Dennie Bible, NP  gabapentin (NEURONTIN) 800 MG tablet Take 1 tablet (800 mg total) by mouth 3 (three) times daily. 12/01/16   Dennie Bible, NP  omeprazole (PRILOSEC) 40 MG  capsule Take 1 capsule (40 mg total) by mouth 2 (two) times daily before a meal. Patient taking differently: Take 20 mg by mouth daily.  03/05/16   Johnson, Clanford L, MD  prochlorperazine (COMPAZINE) 5 MG tablet Take 2 tablets (10 mg total) by mouth every 6 (six) hours as needed for nausea or vomiting. 06/04/16   Daleen Bo, MD  promethazine (PHENERGAN) 12.5 MG suppository Place 1 suppository (12.5 mg total) rectally every 6 (six) hours as needed for nausea, vomiting or refractory nausea /  vomiting. 03/05/16   Johnson, Clanford L, MD  thiamine (VITAMIN B-1) 100 MG tablet Take 100 mg by mouth daily.    [provider]  trimethoprim (TRIMPEX) 100 MG tablet Take 100 mg by mouth daily. 11/22/16   [provider]  zolpidem (AMBIEN) 10 MG tablet Take 2.5 mg by mouth at bedtime as needed for sleep.     [provider]    Family History Family History  Problem Relation Age of Onset  . Stroke Mother   . Diabetes Mother   . Stroke Father   . Diabetes Father   . Diabetes Sister   . Diabetes Brother     Social History Social History   Tobacco Use  . Smoking status: Former Research scientist (life sciences)  . Smokeless tobacco: Never Used  Substance Use Topics  . Alcohol use: Yes    Alcohol/week: 1.0 standard drinks    Types: 1 Glasses of wine per week    Comment: daily  . Drug use: No     Allergies   Amoxicillin; Macrobid  [nitrofurantoin macrocrystal]; and Other   Review of Systems Review of Systems  Constitutional: Negative for chills and fever.  Respiratory: Negative.  Negative for shortness of breath.   Cardiovascular: Negative.  Negative for chest pain.  Gastrointestinal: Positive for abdominal pain, nausea and vomiting. Negative for blood in stool and diarrhea.  Genitourinary: Positive for decreased urine volume.  Musculoskeletal: Negative.   Skin: Negative.   Neurological: Positive for weakness.     Physical Exam Updated Vital Signs BP (!) 158/97 (BP Location: Left Arm)   Pulse 90   Temp 97.9 F (36.6 C) (Oral)   Resp 17   Ht 5\' 9"  (1.753 m)   Wt 93 kg   SpO2 97%   BMI 30.27 kg/m   Physical Exam  Constitutional: He is oriented to person, place, and time. He appears well-developed and well-nourished.  HENT:  Head: Normocephalic.  Neck: Normal range of motion. Neck supple.  Cardiovascular: Normal rate and regular rhythm.  Pulmonary/Chest: Effort normal and breath sounds normal. He has no wheezes. He has no rales.  Abdominal: Soft. He  exhibits distension. Bowel sounds are decreased. There is no tenderness. There is no rebound and no guarding.  Musculoskeletal: Normal range of motion.  Neurological: He is alert and oriented to person, place, and time.  Skin: Skin is warm and dry. No rash noted.  Psychiatric: He has a normal mood and affect.     ED Treatments / Results  Labs (all labs ordered are listed, but only abnormal results are displayed) Labs Reviewed  CBC WITH DIFFERENTIAL/PLATELET  COMPREHENSIVE METABOLIC PANEL  LIPASE, BLOOD   Results for orders placed or performed during the hospital encounter of 01/25/18  CBC with Differential  Result Value Ref Range   WBC 16.0 (H) 4.0 - 10.5 K/uL   RBC 5.53 4.22 - 5.81 MIL/uL   Hemoglobin 17.6 (H) 13.0 - 17.0 g/dL   HCT 51.4 39.0 -  52.0 %   MCV 92.9 78.0 - 100.0 fL   MCH 31.8 26.0 - 34.0 pg   MCHC 34.2 30.0 - 36.0 g/dL   RDW 14.4 11.5 - 15.5 %   Platelets 338 150 - 400 K/uL   Neutrophils Relative % 84 %   Neutro Abs 13.4 (H) 1.7 - 7.7 K/uL   Lymphocytes Relative 8 %   Lymphs Abs 1.4 0.7 - 4.0 K/uL   Monocytes Relative 8 %   Monocytes Absolute 1.3 (H) 0.1 - 1.0 K/uL   Eosinophils Relative 0 %   Eosinophils Absolute 0.0 0.0 - 0.7 K/uL   Basophils Relative 0 %   Basophils Absolute 0.0 0.0 - 0.1 K/uL  Comprehensive metabolic panel  Result Value Ref Range   Sodium 139 135 - 145 mmol/L   Potassium 4.6 3.5 - 5.1 mmol/L   Chloride 95 (L) 98 - 111 mmol/L   CO2 27 22 - 32 mmol/L   Glucose, Bld 210 (H) 70 - 99 mg/dL   BUN 24 (H) 8 - 23 mg/dL   Creatinine, Ser 1.35 (H) 0.61 - 1.24 mg/dL   Calcium 10.1 8.9 - 10.3 mg/dL   Total Protein 9.0 (H) 6.5 - 8.1 g/dL   Albumin 4.5 3.5 - 5.0 g/dL   AST 35 15 - 41 U/L   ALT 35 0 - 44 U/L   Alkaline Phosphatase 85 38 - 126 U/L   Total Bilirubin 1.6 (H) 0.3 - 1.2 mg/dL   GFR calc non Af Amer 50 (L) >60 mL/min   GFR calc Af Amer 58 (L) >60 mL/min   Anion gap 17 (H) 5 - 15  Lipase, blood  Result Value Ref Range   Lipase 25  11 - 51 U/L    EKG None  Radiology No results found.  Procedures Procedures (including critical care time)  Medications Ordered in ED Medications  sodium chloride 0.9 % bolus 1,000 mL (has no administration in time range)  prochlorperazine (COMPAZINE) injection 10 mg (has no administration in time range)     Initial Impression / Assessment and Plan / ED Course  I have reviewed the triage vital signs and the nursing notes.  Pertinent labs & imaging results that were available during my care of the patient were reviewed by me and considered in my medical decision making (see chart for details).     Patient is here with severe nausea with vomiting that has been persistent since 8:00 am this morning. He has Compazine and Phenergan at home that did not provide any relief. Emesis has been non-bloody. No fever. Abdominal pain initially but he does not complain of pain now.   IVF's provided with IV Compazine. On recheck the patient's symptoms have resolved. No nausea. He looks much more comfortable. PO challenge ordered.   He is seen by Dr. Ronnald Nian who discussed imaging with the patient. The patient declined any imaging studies as he feels comfortable that his symptoms tonight were recurrent and secondary to either food or medication. He has a benign abdomen. He is tolerating PO fluids and continues to by asymptomatic.   On final recheck, the patient still declines imaging. Discussed return precautions and patient reports he will return if there is any further uncontrolled nausea, vomiting, pain or if he develops fever, bloody emesis or stool.   Final Clinical Impressions(s) / ED Diagnoses   Final diagnoses:  None   1. Nausea and vomiting  ED Discharge Orders    None  Charlann Lange, PA-C 01/26/18 [place], [place], DO 01/26/18 [id number]

## 2018-01-25 NOTE — ED Triage Notes (Signed)
Pt comes to ed, via [facility], c/o nausea and vomiting, with pevious abdominal pain this morning. No diarrhea, no fever, no chest pain. From home. V/s 140/90, hr88, rr16, spo2 95, cbg 158.

## 2018-01-26 MED ORDER — PROCHLORPERAZINE MALEATE 5 MG PO TABS: 10.0000 mg | ORAL_TABLET | Freq: Four times a day (QID) | ORAL | 1 refills | Status: AC | PRN

## 2018-01-26 MED ORDER — PROMETHAZINE HCL 12.5 MG RE SUPP: 12.5000 mg | Freq: Four times a day (QID) | RECTAL | 0 refills | Status: DC | PRN

## 2018-01-26 NOTE — ED Notes (Signed)
Fluid challenge complete

## 2018-02-03 ENCOUNTER — Other Ambulatory Visit: Payer: Self-pay | Admitting: Nurse Practitioner

## 2018-02-16 IMAGING — CR DG CHEST 2V
2 series · 2 of 2 positions shown · non-contrast
Comparison: Chest radiograph performed 03/02/2016

CLINICAL DATA: Acute onset of shortness of breath and nausea.
Initial encounter.

EXAM:
CHEST  2 VIEW

[w chest pa]
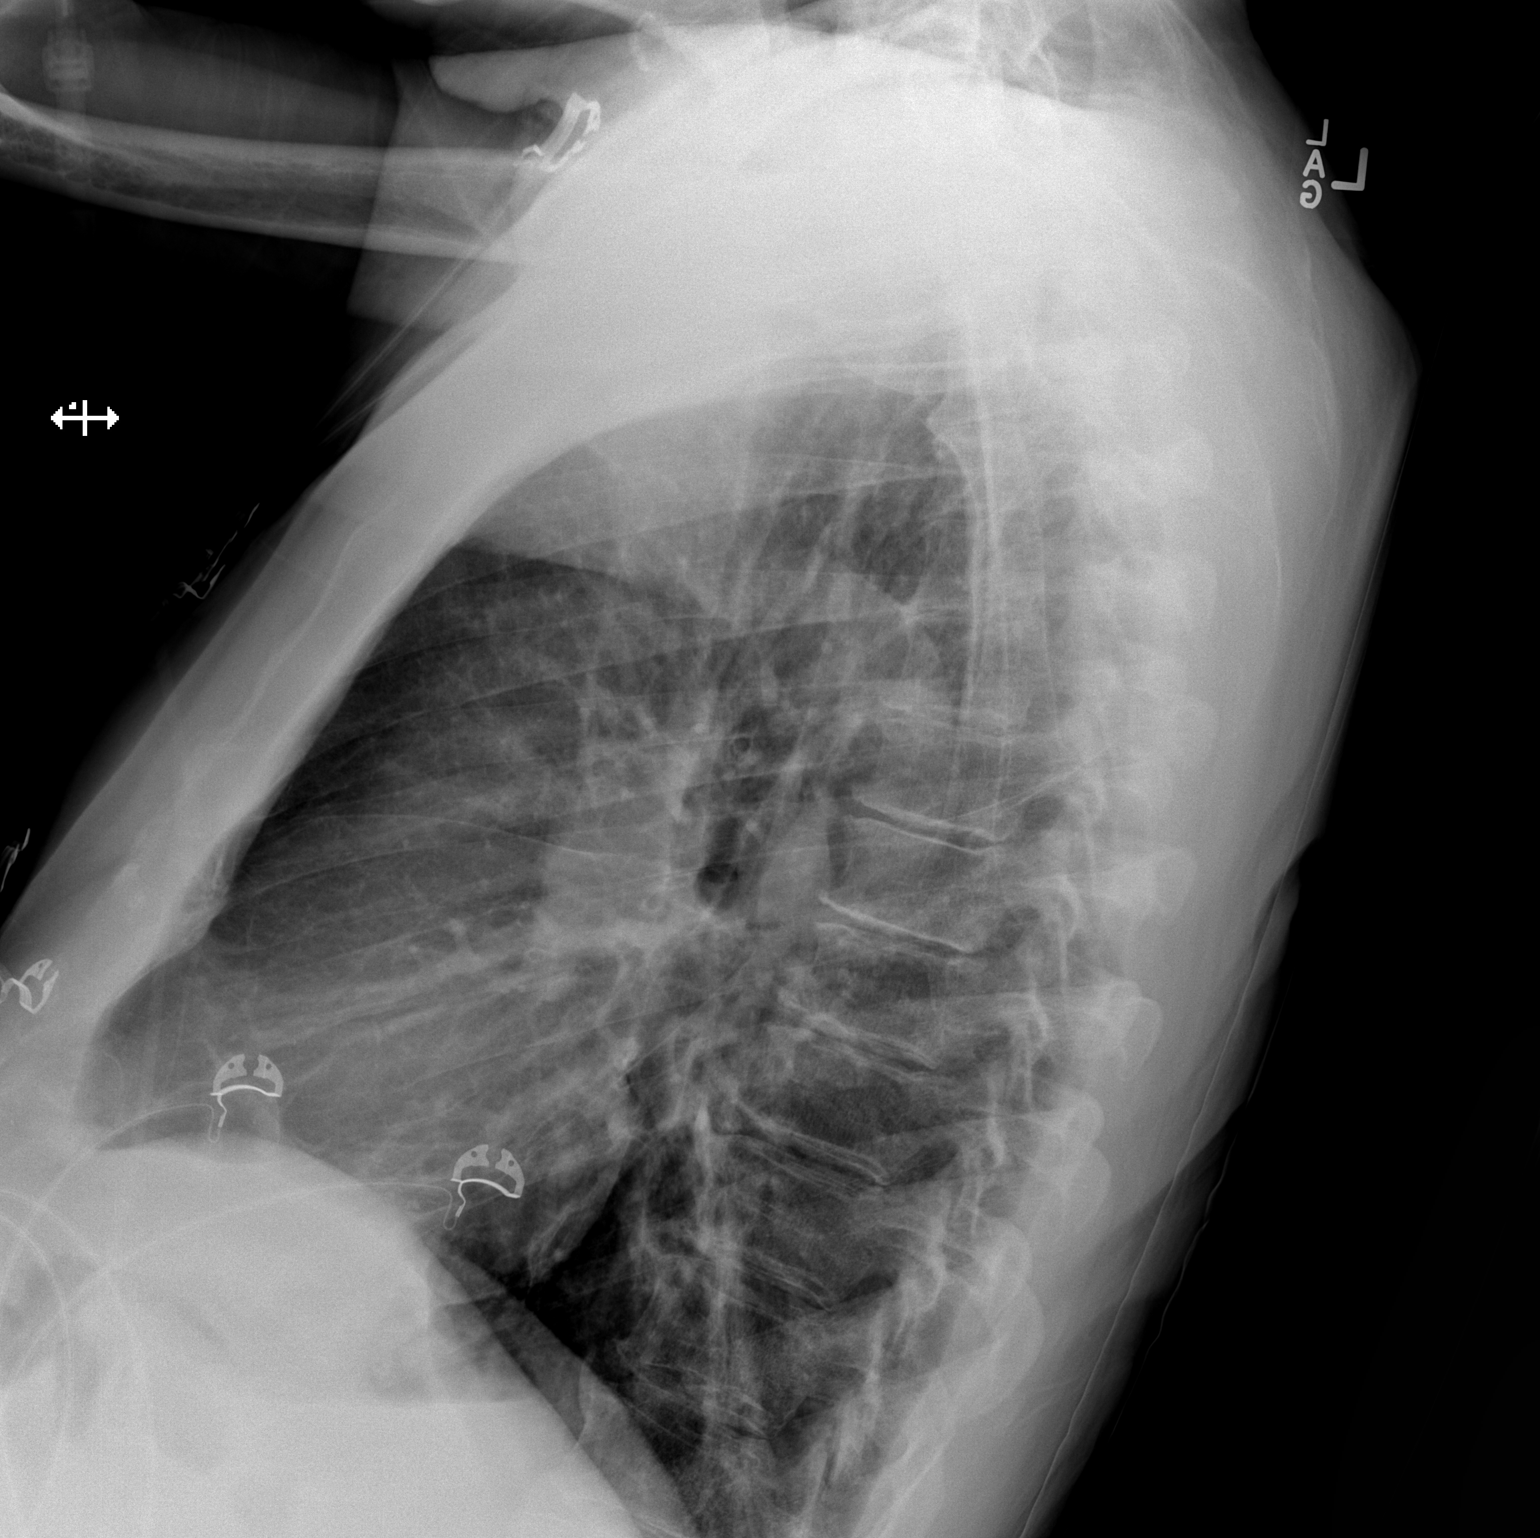

[x chest ap]
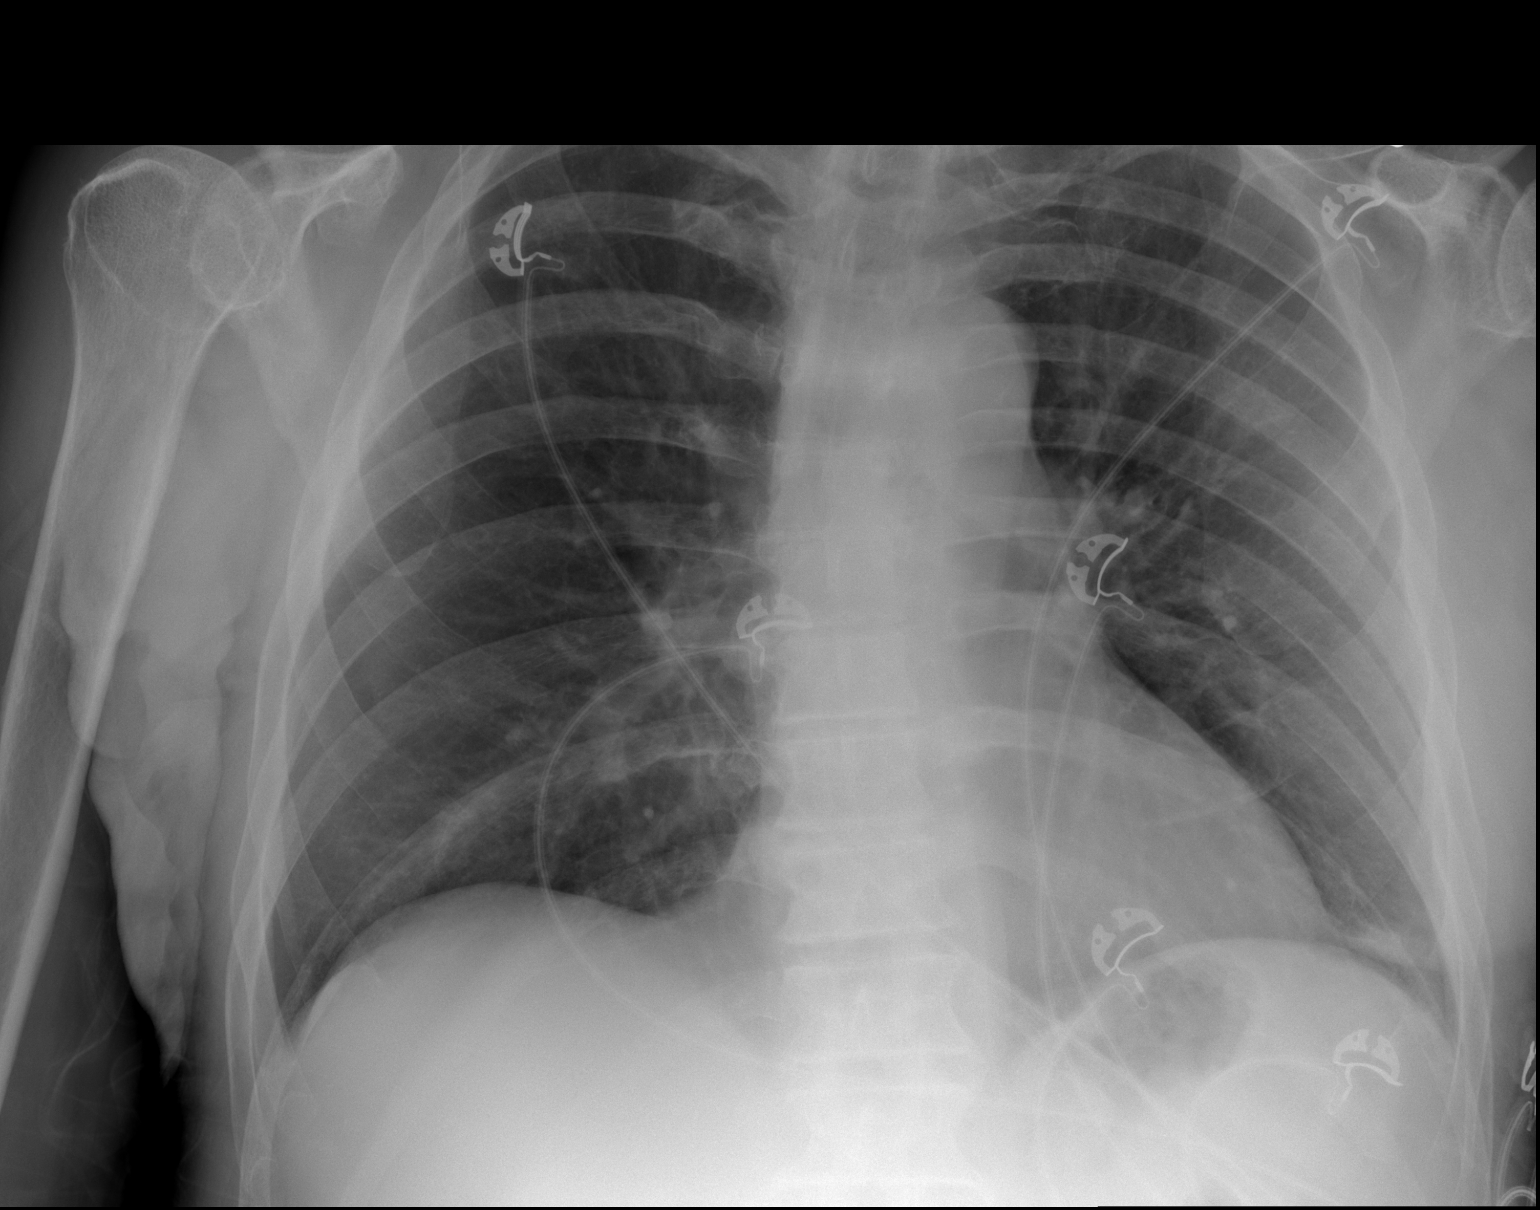

[2 of 2 positions shown; findings below may reference images not displayed]

FINDINGS: The lungs are well-aerated. Mild left basilar atelectasis or
scarring is noted. There is no evidence of pleural effusion or
pneumothorax.

The heart is normal in size; the mediastinal contour is within
normal limits. No acute osseous abnormalities are seen.
IMPRESSION: Mild left basilar atelectasis or scarring noted. Lungs otherwise
grossly clear.

## 2018-04-14 ENCOUNTER — Other Ambulatory Visit: Payer: Self-pay | Admitting: Dermatology

## 2018-06-17 ENCOUNTER — Ambulatory Visit: Payer: Medicare Other | Admitting: Neurology

## 2018-08-24 ENCOUNTER — Telehealth: Payer: Self-pay | Admitting: Neurology

## 2018-08-24 ENCOUNTER — Other Ambulatory Visit: Payer: Self-pay | Admitting: Neurology

## 2018-08-24 NOTE — Telephone Encounter (Signed)
Pt called in for a refill of DULoxetine (CYMBALTA) 60 MG capsule to be sent to [id number] - [place], [place] [address] AT [address]

## 2018-08-24 NOTE — Telephone Encounter (Signed)
The patient is due for an appt.  I called and got him scheduled for 11/15/2018. A 90-day supply of duloxetine has been sent to the requested pharmacy.

## 2018-11-15 ENCOUNTER — Other Ambulatory Visit: Payer: Self-pay

## 2018-11-15 ENCOUNTER — Encounter: Payer: Self-pay | Admitting: Neurology

## 2018-11-15 ENCOUNTER — Ambulatory Visit: Payer: Medicare Other | Admitting: Neurology

## 2018-11-15 VITALS — BP 142/79 | HR 85 | Temp 96.5°F | Ht 69.0 in | Wt 223.5 lb

## 2018-11-15 DIAGNOSIS — G609 Hereditary and idiopathic neuropathy, unspecified: Secondary | ICD-10-CM | POA: Diagnosis not present

## 2018-11-15 DIAGNOSIS — R202 Paresthesia of skin: Secondary | ICD-10-CM

## 2018-11-15 MED ORDER — GABAPENTIN 800 MG PO TABS: 800.0000 mg | ORAL_TABLET | Freq: Three times a day (TID) | ORAL | 4 refills | Status: DC

## 2018-11-15 MED ORDER — DULOXETINE HCL 60 MG PO CPEP: 60.0000 mg | ORAL_CAPSULE | Freq: Every day | ORAL | 4 refills | Status: DC

## 2018-11-15 NOTE — Progress Notes (Signed)
[facility]  PATIENT: David Li DOB: Sep 14, 1940   REASON FOR VISIT: Follow-up for neuropathy which has worsened, history of stroke HISTORY FROM: Patient    HISTORY OF PRESENT ILLNESS:David Li, is a 78 yo white male return for follow up of idiopathic peripheral neuropathy.   He was followed by David Li since April 2006, when he presented with sensory changes starting at the soles of his feet, gradually ascending to bilateral lower extremities, fairly symmetric. NCV and spine imaging were negative then.  In August 2006 he was hospitalized with fever and myalgias, and temporal artery biopsy demonstrated vasculitis. He has since been treated with methotrexate and prednisone, with good response of vasculitis. He is no longer on immunosuppressive treatment.  His neuropathy has progressed by exam and followup NCV. Symptoms have been fairly well controlled on gabapentin 600 mg 3 times a day and nortriptyline 25 mg every night. He had a few bouts of double vision a few years ago, thought likely due to vasculitis. He had a spell of dizziness in 2008, negative imaging at that time, seen in the ER by David Li and treated with Plavix for possible TIA.   Last visit was in October 2015 by nurse practitioner David Li, he continued to complains of slow progression of numbness, tingling, neuropathic pain, now numbness at both mid thigh level. 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, enjoys reading and learning   He is taking titrating dose of gabapentin, currently gabapentin 600 mg 3 times a day, nortriptyline 25 mg 1 tablet at night.  He has gradual worsening bilateral lower extremity paresthesia now up to mid thigh level,  UPDATE May 28 2015:YY He continues to complains of burning at the bottom of  his feet, he is taking Lyrica, which seems to help, he is planning on to move to gabapentin 600mg  tid to see which one helps him better He has mild low back pain,no radiating pain to his legs, he has to self cath once a day since 2016, this happened following his previous TURP surgery, he has difficulty completely empty his bladder, he also complains of worsening constipation.  He feel hand swollen, tightness, he complains of worsening hand writing. CSF showed total elevated total protein 63, normal glucose, normal cells  EMG nerve conduction study January 2017 showed mild axonal peripheral neuropathy, no evidence of demyelinating features  UPDATE July 18th 2017:YY He continue complains of intermittent bilateral feet and hand low-grade paresthesia, despite taking gabapentin 600 mg 3 times a day, denied weakness, mildly difficulty with tandem walking  UPDATE November 16 2018: He is overall doing well, continue taking polypharmacy for his peripheral neuropathy, gabapentin 800 mg 3 times a day, Cymbalta 60 mg daily, most recent EMG nerve conduction study was in January 2017, only mild axonal peripheral neuropathy, I have suggested repeat electrodiagnostic study, he does not want to do it at this point  Review of system: 14 system review was performed, only noted as above ALLERGIES: Allergies  Allergen Reactions  . Amoxicillin Nausea Only    Has patient had a PCN reaction causing immediate rash, facial/tongue/throat swelling, SOB or lightheadedness with hypotension: No Has patient had a PCN reaction causing severe rash involving mucus membranes or skin necrosis: No Has patient had a PCN reaction that required hospitalization: No Has patient had a PCN reaction  occurring within the last 10 years: Yes If all of the above answers are "NO", then may proceed with Cephalosporin use.  Marland Kitchen Macrobid  [Nitrofurantoin Macrocrystal]   . Other     Muscle relaxant medication-unsure of the name    HOME  MEDICATIONS: Outpatient Medications Prior to Visit  Medication Sig Dispense Refill  . acyclovir (ZOVIRAX) 400 MG tablet Take 400 mg by mouth daily as needed (cold sores). Outbreaks     . ALPRAZolam (XANAX) 0.25 MG tablet Take 0.25 mg by mouth daily as needed for anxiety.     . Azelastine HCl 0.15 % SOLN Place 1 spray into both nostrils at bedtime.   3  . Cholecalciferol (VITAMIN D) 2000 units CAPS Take 2,000 Units by mouth daily.    . clopidogrel (PLAVIX) 75 MG tablet TAKE 1 TABLET BY MOUTH EVERY DAY (Patient taking differently: Take 75 mg by mouth at bedtime. ) 90 tablet 3  . DULoxetine (CYMBALTA) 60 MG capsule TAKE 1 CAPSULE BY MOUTH EVERY DAY 90 capsule 0  . gabapentin (NEURONTIN) 800 MG tablet TAKE 1 TABLET BY MOUTH THREE TIMES A DAY 270 tablet 3  . omeprazole (PRILOSEC) 40 MG capsule Take 1 capsule (40 mg total) by mouth 2 (two) times daily before a meal. (Patient taking differently: Take 20 mg by mouth daily. ) 60 capsule 1  . Oxycodone HCl 10 MG TABS TAKE 1 TABLET BY MOUTH FOUR TIMES A DAY AS NEEDED    . prochlorperazine (COMPAZINE) 5 MG tablet Take 2 tablets (10 mg total) by mouth every 6 (six) hours as needed for nausea or vomiting. 15 tablet 1  . promethazine (PHENERGAN) 12.5 MG suppository Place 1 suppository (12.5 mg total) rectally every 6 (six) hours as needed for nausea, vomiting or refractory nausea / vomiting. 12 each 0  . thiamine (VITAMIN B-1) 100 MG tablet Take 100 mg by mouth daily.    Marland Kitchen trimethoprim (TRIMPEX) 100 MG tablet Take 100 mg by mouth daily.  11  . zolpidem (AMBIEN) 10 MG tablet Take 2.5 mg by mouth at bedtime as needed for sleep.     Marland Kitchen HYDROcodone-acetaminophen (NORCO) 10-325 MG tablet Take 1 tablet by mouth 2 (two) times daily.   0   No facility-administered medications prior to visit.     PAST MEDICAL HISTORY: Past Medical History:  Diagnosis Date  . BPH (benign prostatic hyperplasia)    self caths 1-2 times daily.  . Neuromuscular disorder ([place])     idiopathic,both feet,progressing to legs  . Perforated ulcer ([place]) 1982  . Stroke [facility])    states the Plavix is preventative due to the strong family history of stroke    PAST SURGICAL HISTORY: Past Surgical History:  Procedure Laterality Date  . CHOLECYSTECTOMY N/A 04/07/2016   Procedure: LAPAROSCOPIC CHOLECYSTECTOMY;  Surgeon: Mickeal Skinner, MD;  Location: [facility];  Service: General;  Laterality: N/A;  . ESOPHAGOGASTRODUODENOSCOPY N/A 03/05/2016   Procedure: ESOPHAGOGASTRODUODENOSCOPY (EGD);  Surgeon: Laurence Spates, MD;  Location: [facility];  Service: Endoscopy;  Laterality: N/A;  . HERNIA REPAIR  2012  . TONSILLECTOMY    . TRANSURETHRAL RESECTION OF PROSTATE  2006    FAMILY HISTORY: Family History  Problem Relation Age of Onset  . Stroke Mother   . Diabetes Mother   . Stroke Father   . Diabetes Father   . Diabetes Sister   . Diabetes Brother     SOCIAL HISTORY: Social History   Socioeconomic History  . Marital status: Married  Spouse name: Vinnie Level  . Number of children: 2  . Years of education: college  . Highest education level: Not on file  Occupational History    Comment: retired  Scientific laboratory technician  . Financial resource strain: Not on file  . Food insecurity    Worry: Not on file    Inability: Not on file  . Transportation needs    Medical: Not on file    Non-medical: Not on file  Tobacco Use  . Smoking status: Former Research scientist (life sciences)  . Smokeless tobacco: Never Used  Substance and Sexual Activity  . Alcohol use: Yes    Alcohol/week: 1.0 standard drinks    Types: 1 Glasses of wine per week    Comment: daily  . Drug use: No  . Sexual activity: Not on file  Lifestyle  . Physical activity    Days per week: Not on file    Minutes per session: Not on file  . Stress: Not on file  Relationships  . Social Herbalist on phone: Not on file    Gets together: Not on file    Attends religious service: Not on file    Active member  of club or organization: Not on file    Attends meetings of clubs or organizations: Not on file    Relationship status: Not on file  . Intimate partner violence    Fear of current or ex partner: Not on file    Emotionally abused: Not on file    Physically abused: Not on file    Forced sexual activity: Not on file  Other Topics Concern  . Not on file  Social History Narrative   Patient is retired, Married for 26 years Vinnie Level).   EducationAdministrator, sports.   Right handed.   Caffeine- five cups daily only tea           PHYSICAL EXAM  Vitals:   11/15/18 0913  BP: (!) 142/79  Pulse: 85  Temp: (!) 96.5 F (35.8 C)  Weight: 223 lb 8 oz (101.4 kg)  Height: 5\' 9"  (1.753 m)   Body mass index is 33.01 kg/m.  Generalized: Well developed, in no acute distress  Head: normocephalic and atraumatic,. Oropharynx benign  Neck: Supple,  Musculoskeletal: No deformity   Neurological examination   Mentation: Alert oriented to time, place, history taking. Attention span and concentration appropriate. Recent and remote memory intact.  Follows all commands speech and language fluent.   Cranial nerve II-XII: Pupils were equal round reactive to light extraocular movements were full, visual field were full on confrontational test. Facial sensation and strength were normal. hearing was intact to finger rubbing bilaterally. Uvula tongue midline. head turning and shoulder shrug were normal and symmetric.Tongue protrusion into cheek strength was normal. Motor: normal bulk and tone, full strength in the BUE, BLE, fine finger movements normal, no pronator drift. No focal weakness Sensory: Length dependent decreased to light touch pinprick and vibration in the lower extremities to the knees Coordination: finger-nose-finger, heel-to-shin bilaterally, no dysmetria outstretched tremor Reflexes: Absent, plantar responses were flexor bilaterally. Gait and Station: Rising up from seated position without assistance,  normal stance,  moderate stride, good arm swing, smooth turning,    DIAGNOSTIC DATA (LABS, IMAGING, TESTING) - I reviewed patient records, labs, notes, testing and imaging myself where available.  Lab Results  Component Value Date   WBC 16.0 (H) 01/25/2018   HGB 17.6 (H) 01/25/2018   HCT 51.4 01/25/2018   MCV 92.9 01/25/2018  PLT 338 01/25/2018      Component Value Date/Time   NA 139 01/25/2018 2227   NA 139 03/05/2015 1144   K 4.6 01/25/2018 2227   CL 95 (L) 01/25/2018 2227   CO2 27 01/25/2018 2227   GLUCOSE 210 (H) 01/25/2018 2227   BUN 24 (H) 01/25/2018 2227   BUN 9 03/05/2015 1144   CREATININE 1.35 (H) 01/25/2018 2227   CALCIUM 10.1 01/25/2018 2227   PROT 9.0 (H) 01/25/2018 2227   PROT 6.8 03/05/2015 1144   ALBUMIN 4.5 01/25/2018 2227   ALBUMIN 4.1 03/05/2015 1144   AST 35 01/25/2018 2227   ALT 35 01/25/2018 2227   ALKPHOS 85 01/25/2018 2227   BILITOT 1.6 (H) 01/25/2018 2227   BILITOT 0.5 03/05/2015 1144   GFRNONAA 50 (L) 01/25/2018 2227   GFRAA 58 (L) 01/25/2018 2227    Lab Results  Component Value Date   HGBA1C 6.1 (H) 03/05/2015   Lab Results  Component Value Date   VITAMINB12 507 03/05/2015   Lab Results  Component Value Date   TSH 0.957 02/28/2016      ASSESSMENT AND PLAN  78 y.o. year old male  Peripheral neuropathy  Refill his gabapentin 800 mg 3 times a day, Cymbalta 60 mg daily    Marcial Pacas, M.D. Ph.D.  [facility] [place], [place] [address] Phone: [phone] Fax:      [phone]

## 2018-12-16 ENCOUNTER — Other Ambulatory Visit: Payer: Self-pay | Admitting: *Deleted

## 2018-12-16 MED ORDER — CLOPIDOGREL BISULFATE 75 MG PO TABS: 75.0000 mg | ORAL_TABLET | Freq: Every day | ORAL | 3 refills | Status: DC

## 2019-03-28 ENCOUNTER — Other Ambulatory Visit: Payer: Self-pay | Admitting: Dermatology

## 2019-03-30 ENCOUNTER — Telehealth: Payer: Self-pay | Admitting: *Deleted

## 2019-03-30 ENCOUNTER — Ambulatory Visit: Payer: Self-pay | Admitting: Neurology

## 2019-03-30 NOTE — Telephone Encounter (Signed)
No showed follow up appointment. 

## 2019-03-31 ENCOUNTER — Encounter: Payer: Self-pay | Admitting: Neurology

## 2019-05-26 ENCOUNTER — Ambulatory Visit: Payer: Self-pay | Admitting: Neurology

## 2019-06-21 ENCOUNTER — Encounter: Payer: Self-pay | Admitting: Neurology

## 2019-06-21 ENCOUNTER — Other Ambulatory Visit: Payer: Self-pay

## 2019-06-21 ENCOUNTER — Ambulatory Visit: Payer: Medicare PPO | Admitting: Neurology

## 2019-06-21 VITALS — BP 158/85 | HR 76 | Temp 97.7°F | Ht 69.0 in | Wt 224.5 lb

## 2019-06-21 DIAGNOSIS — G609 Hereditary and idiopathic neuropathy, unspecified: Secondary | ICD-10-CM | POA: Diagnosis not present

## 2019-06-21 DIAGNOSIS — M792 Neuralgia and neuritis, unspecified: Secondary | ICD-10-CM

## 2019-06-21 DIAGNOSIS — G250 Essential tremor: Secondary | ICD-10-CM

## 2019-06-21 MED ORDER — PROPRANOLOL HCL 40 MG PO TABS: 40.0000 mg | ORAL_TABLET | Freq: Two times a day (BID) | ORAL | 11 refills | Status: DC

## 2019-06-21 MED ORDER — OXCARBAZEPINE 150 MG PO TABS: 150.0000 mg | ORAL_TABLET | Freq: Two times a day (BID) | ORAL | 11 refills | Status: DC

## 2019-06-21 NOTE — Progress Notes (Signed)
[facility]  PATIENT: David Li DOB: 23-Sep-1940  HISTORY OF PRESENT ILLNESS: Mr. Brando, is a 79 yo white male return for follow up of idiopathic peripheral neuropathy.   He was followed by Dr. Doy Mince since April 2006, when he presented with sensory changes starting at the soles of his feet, gradually ascending to bilateral lower extremities, fairly symmetric. NCV and spine imaging were negative then.  In August 2006 he was hospitalized with fever and myalgias, and temporal artery biopsy demonstrated vasculitis. He has since been treated with methotrexate and prednisone, with good response of vasculitis. He is no longer on immunosuppressive treatment.  His neuropathy has progressed by exam and followup NCV. Symptoms have been fairly well controlled on gabapentin 600 mg 3 times a day and nortriptyline 25 mg every night. He had a few bouts of double vision a few years ago, thought likely due to vasculitis. He had a spell of dizziness in 2008, negative imaging at that time, seen in the ER by Dr. Erling Cruz and treated with Plavix for possible TIA.   Last visit was in October 2015 by nurse practitioner [name], he continued to complains of slow progression of numbness, tingling, neuropathic pain, now numbness at both mid thigh level. 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, enjoys reading and learning   He is taking titrating dose of gabapentin, currently gabapentin 600 mg 3 times a day, nortriptyline 25 mg 1 tablet at night.  He has gradual worsening bilateral lower extremity paresthesia now up to mid thigh level,  UPDATE May 28 2015:YY He continues to complains of burning at the bottom of his feet, he is taking Lyrica, which seems to help, he is planning on to move to gabapentin 600mg  tid to see  which one helps him better He has mild low back pain,no radiating pain to his legs, he has to self cath once a day since 2016, this happened following his previous TURP surgery, he has difficulty completely empty his bladder, he also complains of worsening constipation.  He feel hand swollen, tightness, he complains of worsening hand writing. CSF showed total elevated total protein 63, normal glucose, normal cells  EMG nerve conduction study January 2017 showed mild axonal peripheral neuropathy, no evidence of demyelinating features  UPDATE July 18th 2017: He continue complains of intermittent bilateral feet and hand low-grade paresthesia, despite taking gabapentin 600 mg 3 times a day, denied weakness, mildly difficulty with tandem walking  UPDATE November 16 2018: He is overall doing well, continue taking polypharmacy for his peripheral neuropathy, gabapentin 800 mg 3 times a day, Cymbalta 60 mg daily, most recent EMG nerve conduction study was in January 2017, only mild axonal peripheral neuropathy, I have suggested repeat electrodiagnostic study, he does not want to do it at this point  Update June 21, 2019: He continue have significant neuropathic pain involving bilateral lower extremity from knee down, taking gabapentin 800 mg 3 times a day, Cymbalta 60 mg daily, also received pain management from Dr. Nelva Bush oxycodone 10 mg 3-4 times daily, 4 out of 10 constant neuropathic pain involving bilateral lower extremity,  In addition, he complains of slow worsening bilateral hands tremor, there was no family history of tremor, he noticed gradual onset bilateral hands tremor since 2015, now he has difficulty holding a screwdriver, spill his drinks, difficulty writing,  reported normal laboratory evaluations in September 2020, likely including thyroid functional test,  Review of system: 14 system review was performed, only noted as above ALLERGIES: Allergies  Allergen Reactions  . Amoxicillin  Nausea Only    Has patient had a PCN reaction causing immediate rash, facial/tongue/throat swelling, SOB or lightheadedness with hypotension: No Has patient had a PCN reaction causing severe rash involving mucus membranes or skin necrosis: No Has patient had a PCN reaction that required hospitalization: No Has patient had a PCN reaction occurring within the last 10 years: Yes If all of the above answers are "NO", then may proceed with Cephalosporin use.  Marland Kitchen Macrobid  [Nitrofurantoin Macrocrystal]   . Other     Muscle relaxant medication-unsure of the name    HOME MEDICATIONS: Outpatient Medications Prior to Visit  Medication Sig Dispense Refill  . acyclovir (ZOVIRAX) 400 MG tablet Take 400 mg by mouth daily as needed (cold sores). Outbreaks     . ALPRAZolam (XANAX) 0.25 MG tablet Take 0.25 mg by mouth daily as needed for anxiety.     . Azelastine HCl 0.15 % SOLN Place 1 spray into both nostrils at bedtime.   3  . Cholecalciferol (VITAMIN D) 2000 units CAPS Take 2,000 Units by mouth daily.    . clopidogrel (PLAVIX) 75 MG tablet Take 1 tablet (75 mg total) by mouth daily. 90 tablet 3  . DULoxetine (CYMBALTA) 60 MG capsule Take 1 capsule (60 mg total) by mouth daily. 90 capsule 4  . gabapentin (NEURONTIN) 800 MG tablet Take 1 tablet (800 mg total) by mouth 3 (three) times daily. 270 tablet 4  . omeprazole (PRILOSEC) 40 MG capsule Take 1 capsule (40 mg total) by mouth 2 (two) times daily before a meal. (Patient taking differently: Take 20 mg by mouth daily. ) 60 capsule 1  . Oxycodone HCl 10 MG TABS TAKE 1 TABLET BY MOUTH FOUR TIMES A DAY AS NEEDED    . prochlorperazine (COMPAZINE) 5 MG tablet Take 2 tablets (10 mg total) by mouth every 6 (six) hours as needed for nausea or vomiting. 15 tablet 1  . promethazine (PHENERGAN) 12.5 MG suppository Place 1 suppository (12.5 mg total) rectally every 6 (six) hours as needed for nausea, vomiting or refractory nausea / vomiting. 12 each 0  . thiamine  (VITAMIN B-1) 100 MG tablet Take 100 mg by mouth daily.    Marland Kitchen trimethoprim (TRIMPEX) 100 MG tablet Take 100 mg by mouth daily.  11  . zolpidem (AMBIEN) 10 MG tablet Take 2.5 mg by mouth at bedtime as needed for sleep.      No facility-administered medications prior to visit.    PAST MEDICAL HISTORY: Past Medical History:  Diagnosis Date  . BPH (benign prostatic hyperplasia)    self caths 1-2 times daily.  . Neuromuscular disorder ([place])    idiopathic,both feet,progressing to legs  . Perforated ulcer ([place]) 1982  . Stroke [facility])    states the Plavix is preventative due to the strong family history of stroke    PAST SURGICAL HISTORY: Past Surgical History:  Procedure Laterality Date  . CHOLECYSTECTOMY N/A 04/07/2016   Procedure: LAPAROSCOPIC CHOLECYSTECTOMY;  Surgeon: Mickeal Skinner, MD;  Location: [facility];  Service: General;  Laterality: N/A;  . ESOPHAGOGASTRODUODENOSCOPY N/A 03/05/2016   Procedure: ESOPHAGOGASTRODUODENOSCOPY (EGD);  Surgeon: Laurence Spates, MD;  Location: [facility];  Service: Endoscopy;  Laterality: N/A;  . HERNIA REPAIR  2012  . TONSILLECTOMY    . TRANSURETHRAL RESECTION OF  PROSTATE  2006    FAMILY HISTORY: Family History  Problem Relation Age of Onset  . Stroke Mother   . Diabetes Mother   . Stroke Father   . Diabetes Father   . Diabetes Sister   . Diabetes Brother     SOCIAL HISTORY: Social History   Socioeconomic History  . Marital status: Married    Spouse name: Vinnie Level  . Number of children: 2  . Years of education: college  . Highest education level: Not on file  Occupational History    Comment: retired  Tobacco Use  . Smoking status: Former Research scientist (life sciences)  . Smokeless tobacco: Never Used  Substance and Sexual Activity  . Alcohol use: Yes    Alcohol/week: 1.0 standard drinks    Types: 1 Glasses of wine per week    Comment: daily  . Drug use: No  . Sexual activity: Not on file  Other Topics Concern  . Not on file    Social History Narrative   Patient is retired, Married for 26 years Vinnie Level).   EducationAdministrator, sports.   Right handed.   Caffeine- five cups daily only tea         Social Determinants of Health   Financial Resource Strain:   . Difficulty of Paying Living Expenses: Not on file  Food Insecurity:   . Worried About Charity fundraiser in the Last Year: Not on file  . Ran Out of Food in the Last Year: Not on file  Transportation Needs:   . Lack of Transportation (Medical): Not on file  . Lack of Transportation (Non-Medical): Not on file  Physical Activity:   . Days of Exercise per Week: Not on file  . Minutes of Exercise per Session: Not on file  Stress:   . Feeling of Stress : Not on file  Social Connections:   . Frequency of Communication with Friends and Family: Not on file  . Frequency of Social Gatherings with Friends and Family: Not on file  . Attends Religious Services: Not on file  . Active Member of Clubs or Organizations: Not on file  . Attends Archivist Meetings: Not on file  . Marital Status: Not on file  Intimate Partner Violence:   . Fear of Current or Ex-Partner: Not on file  . Emotionally Abused: Not on file  . Physically Abused: Not on file  . Sexually Abused: Not on file     PHYSICAL EXAM  Vitals:   06/21/19 1055  BP: (!) 158/85  Pulse: 76  Temp: 97.7 F (36.5 C)  Weight: 224 lb 8 oz (101.8 kg)  Height: 5\' 9"  (1.753 m)   Body mass index is 33.15 kg/m.  Generalized: Well developed, in no acute distress  Head: normocephalic and atraumatic,. Oropharynx benign  Neck: Supple,  Musculoskeletal: No deformity   Neurological examination   Mentation: Alert oriented to time, place, history taking. Attention span and concentration appropriate. Recent and remote memory intact.  Follows all commands speech and language fluent.   Cranial nerve II-XII: Pupils were equal round reactive to light extraocular movements were full, visual field were full  on confrontational test. Facial sensation and strength were normal. hearing was intact to casual conversation bilaterally.  head turning and shoulder shrug were normal and symmetric.  Motor: He has bilateral hands posturing tremor, normal muscle tone, no weakness, no rigidity. Sensory: Length dependent decreased to light touch pinprick and vibration in the lower extremities to the knees level Coordination: finger-nose-finger, heel-to-shin bilaterally,  no dysmetria outstretched tremor Reflexes: Absent, plantar responses were flexor bilaterally. Gait and Station: Needs to push up to get up from seated position, mildly antalgic, unsteady  DIAGNOSTIC DATA (LABS, IMAGING, TESTING) - I reviewed patient records, labs, notes, testing and imaging myself where available.  Lab Results  Component Value Date   WBC 16.0 (H) 01/25/2018   HGB 17.6 (H) 01/25/2018   HCT 51.4 01/25/2018   MCV 92.9 01/25/2018   PLT 338 01/25/2018      Component Value Date/Time   NA 139 01/25/2018 2227   NA 139 03/05/2015 1144   K 4.6 01/25/2018 2227   CL 95 (L) 01/25/2018 2227   CO2 27 01/25/2018 2227   GLUCOSE 210 (H) 01/25/2018 2227   BUN 24 (H) 01/25/2018 2227   BUN 9 03/05/2015 1144   CREATININE 1.35 (H) 01/25/2018 2227   CALCIUM 10.1 01/25/2018 2227   PROT 9.0 (H) 01/25/2018 2227   PROT 6.8 03/05/2015 1144   ALBUMIN 4.5 01/25/2018 2227   ALBUMIN 4.1 03/05/2015 1144   AST 35 01/25/2018 2227   ALT 35 01/25/2018 2227   ALKPHOS 85 01/25/2018 2227   BILITOT 1.6 (H) 01/25/2018 2227   BILITOT 0.5 03/05/2015 1144   GFRNONAA 50 (L) 01/25/2018 2227   GFRAA 58 (L) 01/25/2018 2227    Lab Results  Component Value Date   HGBA1C 6.1 (H) 03/05/2015   Lab Results  Component Value Date   VITAMINB12 507 03/05/2015   Lab Results  Component Value Date   TSH 0.957 02/28/2016      ASSESSMENT AND PLAN  79 y.o. year old male  Peripheral neuropathy  EMG nerve conduction study in 2017 showed mild axonal  peripheral neuropathy  Refill his gabapentin 800 mg 3 times a day, Cymbalta 60 mg daily, add on Trileptal 150 mg 3 times daily  Continue with pain management, is receiving oxycodone 10 mg 3 to 4 tablets daily  Slow worsening bilateral hands tremor  Mostly postural tremor, there was no parkinsonian features  Add on Inderal 40 mg twice daily    Marcial Pacas, M.D. Ph.D.  [facility] [place], [place] [address] Phone: [phone] Fax:      [phone]

## 2019-09-29 DIAGNOSIS — R339 Retention of urine, unspecified: Secondary | ICD-10-CM | POA: Diagnosis not present

## 2019-09-29 DIAGNOSIS — R3912 Poor urinary stream: Secondary | ICD-10-CM | POA: Diagnosis not present

## 2019-10-26 DIAGNOSIS — G894 Chronic pain syndrome: Secondary | ICD-10-CM | POA: Diagnosis not present

## 2019-11-10 ENCOUNTER — Other Ambulatory Visit: Payer: Self-pay

## 2019-11-10 ENCOUNTER — Ambulatory Visit: Payer: Medicare PPO | Admitting: Neurology

## 2019-11-10 ENCOUNTER — Encounter: Payer: Self-pay | Admitting: Neurology

## 2019-11-10 VITALS — BP 148/77 | HR 76 | Ht 69.0 in | Wt 223.0 lb

## 2019-11-10 DIAGNOSIS — G250 Essential tremor: Secondary | ICD-10-CM | POA: Diagnosis not present

## 2019-11-10 DIAGNOSIS — G609 Hereditary and idiopathic neuropathy, unspecified: Secondary | ICD-10-CM | POA: Diagnosis not present

## 2019-11-10 MED ORDER — CLOPIDOGREL BISULFATE 75 MG PO TABS: 75.0000 mg | ORAL_TABLET | Freq: Every day | ORAL | 3 refills | Status: DC

## 2019-11-10 MED ORDER — DULOXETINE HCL 60 MG PO CPEP: 60.0000 mg | ORAL_CAPSULE | Freq: Every day | ORAL | 4 refills | Status: DC

## 2019-11-10 MED ORDER — GABAPENTIN 800 MG PO TABS: 800.0000 mg | ORAL_TABLET | Freq: Three times a day (TID) | ORAL | 4 refills | Status: DC

## 2019-11-10 NOTE — Progress Notes (Signed)
PATIENT: David Li DOB: 1940/12/26  REASON FOR VISIT: follow up HISTORY FROM: patient  HISTORY OF PRESENT ILLNESS: Today 11/10/19  HISTORY David Li, is a 79 yo white male return for follow up of idiopathic peripheral neuropathy.   Hewasfollowed by Dr. Doy Mince since April 2006, when he presented with sensory changesstarting at the soles of his feet, gradually ascending to bilaterallower extremities, fairly symmetric. NCV and spine imaging were negativethen. In August 2006 he was hospitalized with fever and myalgias, and temporal artery biopsy demonstrated vasculitis. He has since been treated with methotrexate and prednisone, with good response of vasculitis. He is no longer on immunosuppressive treatment. His neuropathy has progressed by exam and followup NCV. Symptoms have been fairly well controlled on gabapentin 600 mg 3 times a day and nortriptyline 25 mg every night. He had a few bouts of double vision a few years ago, thought likely due to vasculitis. He had a spell of dizziness in 2008, negative imaging at that time, seen in the ER by Dr. Erling Cruz and treated with Plavix for possible TIA.   Last visit was in October 2015 by nurse practitioner [name], he continued to complains of slow progression of numbness, tingling, neuropathic pain, now numbness at both mid thigh level. 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, enjoys reading and learning  He is taking titrating dose of gabapentin, currently gabapentin 600 mg 3 times a day, nortriptyline 25 mg 1 tablet at night.  He has gradual worsening bilateral lower extremity paresthesia now up to mid thigh level,  UPDATE May 28 2015:YY He continues to complains of burning at the bottom of his feet, he is taking Lyrica, which seems to help, he is  planning on to move to gabapentin 600mg  tid to see which one helps him better He has mild low back pain,no radiating pain to his legs, he has to self cath once a day since 2016, this happened following his previous TURP surgery, he has difficulty completely empty his bladder, he also complains of worsening constipation.  He feel hand swollen, tightness, he complains of worsening hand writing. CSF showed total elevated total protein 63, normal glucose, normal cells  EMG nerve conduction study January 2017 showed mild axonal peripheral neuropathy, no evidence of demyelinating features  UPDATE July 18th 2017: He continue complains of intermittent bilateral feet and hand low-grade paresthesia, despite taking gabapentin 600 mg 3 times a day, denied weakness, mildly difficulty with tandem walking  UPDATE November 16 2018: He is overall doing well, continue taking polypharmacy for his peripheral neuropathy, gabapentin 800 mg 3 times a day, Cymbalta 60 mg daily, most recent EMG nerve conduction study was in January 2017, only mild axonal peripheral neuropathy, I have suggested repeat electrodiagnostic study, he does not want to do it at this point  Update June 21, 2019: He continue have significant neuropathic pain involving bilateral lower extremity from knee down, taking gabapentin 800 mg 3 times a day, Cymbalta 60 mg daily, also received pain management from Dr. Nelva Bush oxycodone 10 mg 3-4 times daily, 4 out of 10 constant neuropathic pain involving bilateral lower extremity,  In addition, he complains of slow worsening bilateral hands tremor, there was no family history of tremor, he noticed gradual onset bilateral hands tremor since 2015, now he has difficulty holding a screwdriver, spill his drinks,  difficulty writing, reported normal laboratory evaluations in September 2020, likely including thyroid functional test,  Update November 10, 2019 SS: When last seen, added Inderal 40 mg twice a day, for  bilateral hand tremor.  Remains on gabapentin, Cymbalta, added Trileptal for neuropathy.  Sees pain management receiving oxycodone.  Never started the Trileptal or Inderal due to fear of side effect.  Overall remained stable.  Has bilateral hand tremor, difficulty with fine motor skills, including eating super cereal.  Has neuropathic pain from the mid thigh down, also in hands.  Medications working adequately, has discussed with pain management, Dr. Nelva Bush, switching to something other than oxycodone. We talked a lot of his heavy involvement in gymnastics.  Overall, condition appears stable.  REVIEW OF SYSTEMS: Out of a complete 14 system review of symptoms, the patient complains only of the following symptoms, and all other reviewed systems are negative.  Chronic pain, numbness, tremor  ALLERGIES: Allergies  Allergen Reactions  . Amoxicillin Nausea Only    Has patient had a PCN reaction causing immediate rash, facial/tongue/throat swelling, SOB or lightheadedness with hypotension: No Has patient had a PCN reaction causing severe rash involving mucus membranes or skin necrosis: No Has patient had a PCN reaction that required hospitalization: No Has patient had a PCN reaction occurring within the last 10 years: Yes If all of the above answers are "NO", then may proceed with Cephalosporin use.  Marland Kitchen Macrobid  [Nitrofurantoin Macrocrystal]   . Other     Muscle relaxant medication-unsure of the name    HOME MEDICATIONS: Outpatient Medications Prior to Visit  Medication Sig Dispense Refill  . acyclovir (ZOVIRAX) 400 MG tablet Take 400 mg by mouth daily as needed (cold sores). Outbreaks     . ALPRAZolam (XANAX) 0.25 MG tablet Take 0.25 mg by mouth daily as needed for anxiety.     . Azelastine HCl 0.15 % SOLN Place 1 spray into both nostrils at bedtime.   3  . Cholecalciferol (VITAMIN D) 2000 units CAPS Take 2,000 Units by mouth daily.    Marland Kitchen omeprazole (PRILOSEC) 40 MG capsule Take 1 capsule (40 mg  total) by mouth 2 (two) times daily before a meal. (Patient taking differently: Take 20 mg by mouth daily. ) 60 capsule 1  . Oxycodone HCl 10 MG TABS TAKE 1 TABLET BY MOUTH FOUR TIMES A DAY AS NEEDED    . prochlorperazine (COMPAZINE) 5 MG tablet Take 2 tablets (10 mg total) by mouth every 6 (six) hours as needed for nausea or vomiting. 15 tablet 1  . promethazine (PHENERGAN) 12.5 MG suppository Place 1 suppository (12.5 mg total) rectally every 6 (six) hours as needed for nausea, vomiting or refractory nausea / vomiting. 12 each 0  . thiamine (VITAMIN B-1) 100 MG tablet Take 100 mg by mouth daily.    Marland Kitchen trimethoprim (TRIMPEX) 100 MG tablet Take 100 mg by mouth daily.  11  . zolpidem (AMBIEN) 10 MG tablet Take 2.5 mg by mouth at bedtime as needed for sleep.     . clopidogrel (PLAVIX) 75 MG tablet Take 1 tablet (75 mg total) by mouth daily. 90 tablet 3  . DULoxetine (CYMBALTA) 60 MG capsule Take 1 capsule (60 mg total) by mouth daily. 90 capsule 4  . gabapentin (NEURONTIN) 800 MG tablet Take 1 tablet (800 mg total) by mouth 3 (three) times daily. 270 tablet 4  . OXcarbazepine (TRILEPTAL) 150 MG tablet Take 1 tablet (150 mg total) by mouth 2 (two) times daily. 60 tablet  11  . propranolol (INDERAL) 40 MG tablet Take 1 tablet (40 mg total) by mouth 2 (two) times daily. 60 tablet 11   No facility-administered medications prior to visit.    PAST MEDICAL HISTORY: Past Medical History:  Diagnosis Date  . BPH (benign prostatic hyperplasia)    self caths 1-2 times daily.  . Neuromuscular disorder ([place])    idiopathic,both feet,progressing to legs  . Perforated ulcer ([place]) 1982  . Stroke [facility])    states the Plavix is preventative due to the strong family history of stroke    PAST SURGICAL HISTORY: Past Surgical History:  Procedure Laterality Date  . CHOLECYSTECTOMY N/A 04/07/2016   Procedure: LAPAROSCOPIC CHOLECYSTECTOMY;  Surgeon: Mickeal Skinner, MD;  Location: [facility];   Service: General;  Laterality: N/A;  . ESOPHAGOGASTRODUODENOSCOPY N/A 03/05/2016   Procedure: ESOPHAGOGASTRODUODENOSCOPY (EGD);  Surgeon: Laurence Spates, MD;  Location: [facility];  Service: Endoscopy;  Laterality: N/A;  . HERNIA REPAIR  2012  . TONSILLECTOMY    . TRANSURETHRAL RESECTION OF PROSTATE  2006    FAMILY HISTORY: Family History  Problem Relation Age of Onset  . Stroke Mother   . Diabetes Mother   . Stroke Father   . Diabetes Father   . Diabetes Sister   . Diabetes Brother     SOCIAL HISTORY: Social History   Socioeconomic History  . Marital status: Married    Spouse name: Vinnie Level  . Number of children: 2  . Years of education: college  . Highest education level: Not on file  Occupational History    Comment: retired  Tobacco Use  . Smoking status: Former Research scientist (life sciences)  . Smokeless tobacco: Never Used  Substance and Sexual Activity  . Alcohol use: Yes    Alcohol/week: 1.0 standard drink    Types: 1 Glasses of wine per week    Comment: daily  . Drug use: No  . Sexual activity: Not on file  Other Topics Concern  . Not on file  Social History Narrative   Patient is retired, Married for 26 years Vinnie Level).   EducationAdministrator, sports.   Right handed.   Caffeine- five cups daily only tea         Social Determinants of Health   Financial Resource Strain:   . Difficulty of Paying Living Expenses:   Food Insecurity:   . Worried About Charity fundraiser in the Last Year:   . Arboriculturist in the Last Year:   Transportation Needs:   . Film/video editor (Medical):   Marland Kitchen Lack of Transportation (Non-Medical):   Physical Activity:   . Days of Exercise per Week:   . Minutes of Exercise per Session:   Stress:   . Feeling of Stress :   Social Connections:   . Frequency of Communication with Friends and Family:   . Frequency of Social Gatherings with Friends and Family:   . Attends Religious Services:   . Active Member of Clubs or Organizations:   . Attends English as a second language teacher Meetings:   Marland Kitchen Marital Status:   Intimate Partner Violence:   . Fear of Current or Ex-Partner:   . Emotionally Abused:   Marland Kitchen Physically Abused:   . Sexually Abused:    PHYSICAL EXAM  Vitals:   11/10/19 1024 11/10/19 1026  BP: (!) 160/83 (!) 148/77  Pulse: 82 76  Weight: 223 lb (101.2 kg)   Height: 5\' 9"  (1.753 m)    Body mass index is 32.93 kg/m.  Generalized: Well developed, in no acute distress   Neurological examination  Mentation: Alert oriented to time, place, history taking. Follows all commands speech and language fluent Cranial nerve II-XII: Pupils were equal round reactive to light. Extraocular movements were full, visual field were full on confrontational test. Facial sensation and strength were normal. Head turning and shoulder shrug  were normal and symmetric. Motor: Motor strength is intact, bilateral postural hand tremor, no rigidity Sensory: Dependent decreased light touch, pinprick, vibration up to mid shin bilaterally Coordination: Cerebellar testing reveals good finger-nose-finger and heel-to-shin bilaterally.  Moderate tremor bilaterally with finger-nose-finger Gait and station: Slightly wide-based, somewhat cautious, mild difficulty walking on heels and toes Reflexes: Deep tendon reflexes are depressed throughout  DIAGNOSTIC DATA (LABS, IMAGING, TESTING) - I reviewed patient records, labs, notes, testing and imaging myself where available.  Lab Results  Component Value Date   WBC 16.0 (H) 01/25/2018   HGB 17.6 (H) 01/25/2018   HCT 51.4 01/25/2018   MCV 92.9 01/25/2018   PLT 338 01/25/2018      Component Value Date/Time   NA 139 01/25/2018 2227   NA 139 03/05/2015 1144   K 4.6 01/25/2018 2227   CL 95 (L) 01/25/2018 2227   CO2 27 01/25/2018 2227   GLUCOSE 210 (H) 01/25/2018 2227   BUN 24 (H) 01/25/2018 2227   BUN 9 03/05/2015 1144   CREATININE 1.35 (H) 01/25/2018 2227   CALCIUM 10.1 01/25/2018 2227   PROT 9.0 (H) 01/25/2018 2227    PROT 6.8 03/05/2015 1144   ALBUMIN 4.5 01/25/2018 2227   ALBUMIN 4.1 03/05/2015 1144   AST 35 01/25/2018 2227   ALT 35 01/25/2018 2227   ALKPHOS 85 01/25/2018 2227   BILITOT 1.6 (H) 01/25/2018 2227   BILITOT 0.5 03/05/2015 1144   GFRNONAA 50 (L) 01/25/2018 2227   GFRAA 58 (L) 01/25/2018 2227   No results found for: CHOL, HDL, LDLCALC, LDLDIRECT, TRIG, CHOLHDL Lab Results  Component Value Date   HGBA1C 6.1 (H) 03/05/2015   Lab Results  Component Value Date   VITAMINB12 507 03/05/2015   Lab Results  Component Value Date   TSH 0.957 02/28/2016   ASSESSMENT AND PLAN 79 y.o. year old male  has a past medical history of BPH (benign prostatic hyperplasia), Neuromuscular disorder ([place]), Perforated ulcer ([place]) (1982), and Stroke ([place]). here with:  1.  Peripheral neuropathy -EMG nerve conduction study in 2017 showed mild axonal peripheral neuropathy -Continue gabapentin 800 mg 3 times a day -Continue Cymbalta 60 mg daily -Never started Trileptal due to fear of side effect -Currently, satisfied with current medication regimen, symptoms overall stable -Continues to follow with pain management receiving oxycodone  2.  Slow worsening bilateral hand tremor -Mostly postural tremor, no parkinsonian features -Never started Inderal due to fear of side effect, tremor remains overall stable, doesn't desire medication treatment -Follow-up in 1 year or sooner if needed  3.  History of stroke -Continue Plavix 75 mg daily, for secondary stroke prevention  I spent 30 minutes of face-to-face and non-face-to-face time with patient.  This included previsit chart review, lab review, study review, order entry, electronic health record documentation, patient education.  Butler Denmark, AGNP-C, DNP 11/10/2019, 11:00 AM [facility] [address] [place], [place] [address] [phone]

## 2019-11-10 NOTE — Patient Instructions (Signed)
Continue current medications  See you back in 1 year

## 2019-11-16 ENCOUNTER — Ambulatory Visit: Payer: Medicare Other | Admitting: Neurology

## 2019-12-23 DIAGNOSIS — N1330 Unspecified hydronephrosis: Secondary | ICD-10-CM | POA: Diagnosis not present

## 2019-12-23 DIAGNOSIS — N3020 Other chronic cystitis without hematuria: Secondary | ICD-10-CM | POA: Diagnosis not present

## 2019-12-29 DIAGNOSIS — N644 Mastodynia: Secondary | ICD-10-CM | POA: Diagnosis not present

## 2020-01-03 ENCOUNTER — Other Ambulatory Visit: Payer: Self-pay | Admitting: Family Medicine

## 2020-01-03 DIAGNOSIS — N644 Mastodynia: Secondary | ICD-10-CM

## 2020-01-04 DIAGNOSIS — E1169 Type 2 diabetes mellitus with other specified complication: Secondary | ICD-10-CM | POA: Diagnosis not present

## 2020-01-18 ENCOUNTER — Other Ambulatory Visit: Payer: Medicare Other

## 2020-01-19 DIAGNOSIS — H524 Presbyopia: Secondary | ICD-10-CM | POA: Diagnosis not present

## 2020-01-25 DIAGNOSIS — Z79899 Other long term (current) drug therapy: Secondary | ICD-10-CM | POA: Diagnosis not present

## 2020-01-27 ENCOUNTER — Other Ambulatory Visit: Payer: Self-pay

## 2020-01-27 ENCOUNTER — Ambulatory Visit
Admission: RE | Admit: 2020-01-27 | Discharge: 2020-01-27 | Disposition: A | Discharge status: Home / Self Care | Payer: Medicare PPO | Source: Ambulatory Visit | Attending: Family Medicine | Admitting: Family Medicine

## 2020-01-27 ENCOUNTER — Ambulatory Visit: Payer: Self-pay

## 2020-01-27 DIAGNOSIS — N644 Mastodynia: Secondary | ICD-10-CM

## 2020-01-27 DIAGNOSIS — R928 Other abnormal and inconclusive findings on diagnostic imaging of breast: Secondary | ICD-10-CM | POA: Diagnosis not present

## 2020-04-02 NOTE — Progress Notes (Signed)
I have reviewed and agreed above plan. 

## 2020-04-10 DIAGNOSIS — G4700 Insomnia, unspecified: Secondary | ICD-10-CM | POA: Diagnosis not present

## 2020-04-10 DIAGNOSIS — J309 Allergic rhinitis, unspecified: Secondary | ICD-10-CM | POA: Diagnosis not present

## 2020-04-10 DIAGNOSIS — E559 Vitamin D deficiency, unspecified: Secondary | ICD-10-CM | POA: Diagnosis not present

## 2020-04-10 DIAGNOSIS — Z0000 Encounter for general adult medical examination without abnormal findings: Secondary | ICD-10-CM | POA: Diagnosis not present

## 2020-04-10 DIAGNOSIS — E1169 Type 2 diabetes mellitus with other specified complication: Secondary | ICD-10-CM | POA: Diagnosis not present

## 2020-04-10 DIAGNOSIS — G629 Polyneuropathy, unspecified: Secondary | ICD-10-CM | POA: Diagnosis not present

## 2020-04-10 DIAGNOSIS — K219 Gastro-esophageal reflux disease without esophagitis: Secondary | ICD-10-CM | POA: Diagnosis not present

## 2020-04-10 DIAGNOSIS — I10 Essential (primary) hypertension: Secondary | ICD-10-CM | POA: Diagnosis not present

## 2020-04-10 DIAGNOSIS — E7800 Pure hypercholesterolemia, unspecified: Secondary | ICD-10-CM | POA: Diagnosis not present

## 2020-04-25 DIAGNOSIS — G894 Chronic pain syndrome: Secondary | ICD-10-CM | POA: Diagnosis not present

## 2020-04-25 DIAGNOSIS — Z79891 Long term (current) use of opiate analgesic: Secondary | ICD-10-CM | POA: Diagnosis not present

## 2020-04-25 DIAGNOSIS — M5136 Other intervertebral disc degeneration, lumbar region: Secondary | ICD-10-CM | POA: Insufficient documentation

## 2020-04-27 DIAGNOSIS — Z79899 Other long term (current) drug therapy: Secondary | ICD-10-CM | POA: Diagnosis not present

## 2020-06-27 DIAGNOSIS — R339 Retention of urine, unspecified: Secondary | ICD-10-CM | POA: Diagnosis not present

## 2020-06-27 DIAGNOSIS — N390 Urinary tract infection, site not specified: Secondary | ICD-10-CM | POA: Diagnosis not present

## 2020-06-27 DIAGNOSIS — N3020 Other chronic cystitis without hematuria: Secondary | ICD-10-CM | POA: Diagnosis not present

## 2020-08-24 DIAGNOSIS — G894 Chronic pain syndrome: Secondary | ICD-10-CM | POA: Diagnosis not present

## 2020-10-31 ENCOUNTER — Other Ambulatory Visit: Payer: Self-pay | Admitting: Neurology

## 2020-11-14 ENCOUNTER — Other Ambulatory Visit: Payer: Self-pay

## 2020-11-14 ENCOUNTER — Encounter: Payer: Self-pay | Admitting: Neurology

## 2020-11-14 ENCOUNTER — Ambulatory Visit: Payer: Medicare PPO | Admitting: Neurology

## 2020-11-14 VITALS — BP 126/76 | HR 66 | Ht 69.0 in | Wt 198.0 lb

## 2020-11-14 DIAGNOSIS — G609 Hereditary and idiopathic neuropathy, unspecified: Secondary | ICD-10-CM

## 2020-11-14 DIAGNOSIS — G250 Essential tremor: Secondary | ICD-10-CM

## 2020-11-14 MED ORDER — CLOPIDOGREL BISULFATE 75 MG PO TABS: 75.0000 mg | ORAL_TABLET | Freq: Every day | ORAL | 3 refills | Status: DC

## 2020-11-14 MED ORDER — DULOXETINE HCL 60 MG PO CPEP: 60.0000 mg | ORAL_CAPSULE | Freq: Every day | ORAL | 4 refills | Status: DC

## 2020-11-14 MED ORDER — GABAPENTIN 800 MG PO TABS: 800.0000 mg | ORAL_TABLET | Freq: Three times a day (TID) | ORAL | 4 refills | Status: DC

## 2020-11-14 NOTE — Patient Instructions (Signed)
Continue current medications  Be careful not to fall  See you back in 1 year

## 2020-11-14 NOTE — Progress Notes (Signed)
PATIENT: David Li DOB: 1941/03/19  REASON FOR VISIT: follow up HISTORY FROM: patient Primary Neurologist: David Li   HISTORY David Li, is a 80 yo white male return for follow up of idiopathic peripheral neuropathy.    He was followed by David Li since April 2006, when he presented with sensory changes starting at the soles of his feet, gradually ascending to bilateral lower extremities, fairly symmetric. NCV and spine imaging were negative then.  In August 2006 he was hospitalized with fever and myalgias, and temporal artery biopsy demonstrated vasculitis. He has since been treated with methotrexate and prednisone, with good response of vasculitis. He is no longer on immunosuppressive treatment.  His neuropathy has progressed by exam and followup NCV. Symptoms have been fairly well controlled on gabapentin 600 mg 3 times a day and nortriptyline 25 mg every night. He had a few bouts of double vision a few years ago, thought likely due to vasculitis. He had a spell of dizziness in 2008, negative imaging at that time, seen in the ER by David Li and treated with Plavix for possible TIA.    Last visit was in October 2015 by nurse practitioner David Li, he continued to complains of slow progression of numbness, tingling, neuropathic pain, now numbness at both mid thigh level. 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, enjoys reading and learning   He is taking titrating dose of gabapentin, currently gabapentin 600 mg 3 times a day, nortriptyline 25 mg 1 tablet at night.   He has gradual worsening bilateral lower extremity paresthesia now up to mid thigh level,   UPDATE May 28 2015:YY He continues to complains of burning at the bottom of his feet, he is taking Lyrica, which seems to help, he is planning on to  move to gabapentin '600mg'$  tid to see which one helps him better He has mild low back pain,no radiating pain to his legs, he has to self cath once a day since 2016, this happened following his previous TURP surgery, he has difficulty completely empty his bladder, he also complains of worsening constipation.   He feel hand swollen, tightness, he complains of worsening hand writing. CSF showed total elevated total protein 63, normal glucose, normal cells   EMG nerve conduction study January 2017 showed mild axonal peripheral neuropathy, no evidence of demyelinating features   UPDATE July 18th 2017: He continue complains of intermittent bilateral feet and hand low-grade paresthesia, despite taking gabapentin 600 mg 3 times a day, denied weakness, mildly difficulty with tandem walking   UPDATE November 16 2018: He is overall doing well, continue taking polypharmacy for his peripheral neuropathy, gabapentin 800 mg 3 times a day, Cymbalta 60 mg daily, most recent EMG nerve conduction study was in January 2017, only mild axonal peripheral neuropathy, I have suggested repeat electrodiagnostic study, he does not want to do it at this point   Update June 21, 2019: He continue have significant neuropathic pain involving bilateral lower extremity from knee down, taking gabapentin 800 mg 3 times a day, Cymbalta 60 mg daily, also received pain management from David Li oxycodone 10 mg 3-4 times daily, 4 out of 10 constant neuropathic pain involving bilateral lower extremity,   In addition, he complains of slow worsening bilateral hands tremor, there was no family history of tremor, he noticed gradual  onset bilateral hands tremor since 2015, now he has difficulty holding a screwdriver, spill his drinks, difficulty writing, reported normal laboratory evaluations in September 2020, likely including thyroid functional test,  Update November 10, 2019 SS: When last seen, added Inderal 40 mg twice a day, for bilateral hand  tremor.  Remains on gabapentin, Cymbalta, added Trileptal for neuropathy.  Sees pain management receiving oxycodone.  Never started the Trileptal or Inderal due to fear of side effect.  Overall remained stable.  Has bilateral hand tremor, difficulty with fine motor skills, including eating super cereal.  Has neuropathic pain from the mid thigh down, also in hands.  Medications working adequately, has discussed with pain management, David Li, switching to something other than oxycodone. We talked a lot of his heavy involvement in gymnastics.  Overall, condition appears stable.  Update November 15, 2020 SS: Going to pain clinic, now on Dilaudid. Tremor is same, in both hands, getting worse overtime. Mostly affects handwriting, artwork. Hands more involved with neuropathy, noted some numbness, tactile sensation isn't as firm. Getting around okay, more prone to falls when ground isn't level, his home is on a hill. He enjoys riding his bike, working in garden. Lives with his wife. Right and left hand are equal with tremor, difficulty with spiral draw, handwriting sample is mild to moderate tremor translation. Still has burning in the feet. Currently happy with regimen, this is "liveable"., not ready for medication change.   REVIEW OF SYSTEMS: Out of a complete 14 system review of symptoms, the patient complains only of the following symptoms, and all other reviewed systems are negative.  See HPI  ALLERGIES: Allergies  Allergen Reactions   Amoxicillin Nausea Only    Has patient had a PCN reaction causing immediate rash, facial/tongue/throat swelling, SOB or lightheadedness with hypotension: No Has patient had a PCN reaction causing severe rash involving mucus membranes or skin necrosis: No Has patient had a PCN reaction that required hospitalization: No Has patient had a PCN reaction occurring within the last 10 years: Yes If all of the above answers are "NO", then may proceed with Cephalosporin use.    Macrobid  [Nitrofurantoin Macrocrystal]    Other     Muscle relaxant medication-unsure of the name    HOME MEDICATIONS: Outpatient Medications Prior to Visit  Medication Sig Dispense Refill   acyclovir (ZOVIRAX) 400 MG tablet Take 400 mg by mouth daily as needed (cold sores). Outbreaks      ALPRAZolam (XANAX) 0.25 MG tablet Take 0.25 mg by mouth daily as needed for anxiety.      Azelastine HCl 0.15 % SOLN Place 1 spray into both nostrils at bedtime.   3   Cholecalciferol (VITAMIN D) 2000 units CAPS Take 2,000 Units by mouth daily.     clopidogrel (PLAVIX) 75 MG tablet TAKE 1 TABLET BY MOUTH EVERY DAY 90 tablet 0   DULoxetine (CYMBALTA) 60 MG capsule Take 1 capsule (60 mg total) by mouth daily. 90 capsule 4   gabapentin (NEURONTIN) 800 MG tablet Take 1 tablet (800 mg total) by mouth 3 (three) times daily. 270 tablet 4   HYDROmorphone (DILAUDID) 4 MG tablet hydromorphone 4 mg tablet  TAKE 1 TABLET BY MOUTH 4 TIMES A DAY AS NEEDED     omeprazole (PRILOSEC) 40 MG capsule Take 1 capsule (40 mg total) by mouth 2 (two) times daily before a meal. (Patient taking differently: Take 20 mg by mouth daily.) 60 capsule 1   prochlorperazine (COMPAZINE) 5 MG tablet Take 2  tablets (10 mg total) by mouth every 6 (six) hours as needed for nausea or vomiting. 15 tablet 1   promethazine (PHENERGAN) 12.5 MG suppository Place 1 suppository (12.5 mg total) rectally every 6 (six) hours as needed for nausea, vomiting or refractory nausea / vomiting. 12 each 0   thiamine (VITAMIN B-1) 100 MG tablet Take 100 mg by mouth daily.     trimethoprim (TRIMPEX) 100 MG tablet Take 100 mg by mouth daily.  11   zolpidem (AMBIEN) 10 MG tablet Take 2.5 mg by mouth at bedtime as needed for sleep.      Oxycodone HCl 10 MG TABS TAKE 1 TABLET BY MOUTH FOUR TIMES A DAY AS NEEDED     No facility-administered medications prior to visit.    PAST MEDICAL HISTORY: Past Medical History:  Diagnosis Date   BPH (benign prostatic  hyperplasia)    self caths 1-2 times daily.   Neuromuscular disorder ([place])    idiopathic,both feet,progressing to legs   Perforated ulcer ([place]) 1982   Stroke [facility])    states the Plavix is preventative due to the strong family history of stroke    PAST SURGICAL HISTORY: Past Surgical History:  Procedure Laterality Date   CHOLECYSTECTOMY N/A 04/07/2016   Procedure: LAPAROSCOPIC CHOLECYSTECTOMY;  Surgeon: Mickeal Skinner, MD;  [facility];  Service: General;  Laterality: N/A;   ESOPHAGOGASTRODUODENOSCOPY N/A 03/05/2016   Procedure: ESOPHAGOGASTRODUODENOSCOPY (EGD);  Surgeon: Laurence Spates, MD;  Location: [facility];  Service: Endoscopy;  Laterality: N/A;   HERNIA REPAIR  2012   TONSILLECTOMY     TRANSURETHRAL RESECTION OF PROSTATE  2006    FAMILY HISTORY: Family History  Problem Relation Age of Onset   Stroke Mother    Diabetes Mother    Stroke Father    Diabetes Father    Diabetes Sister    Diabetes Brother     SOCIAL HISTORY: Social History   Socioeconomic History   Marital status: Married    Spouse name: Vinnie Level   Number of children: 2   Years of education: college   Highest education level: Not on file  Occupational History    Comment: retired  Tobacco Use   Smoking status: Former    Pack years: 0.00   Smokeless tobacco: Never  Substance and Sexual Activity   Alcohol use: Yes    Alcohol/week: 1.0 standard drink    Types: 1 Glasses of wine per week    Comment: daily   Drug use: No   Sexual activity: Not on file  Other Topics Concern   Not on file  Social History Narrative   Patient is retired, Married for 26 years Vinnie Level).   EducationAdministrator, sports.   Right handed.   Caffeine- five cups daily only tea         Social Determinants of Health   Financial Resource Strain: Not on file  Food Insecurity: Not on file  Transportation Needs: Not on file  Physical Activity: Not on file  Stress: Not on file  Social Connections:  Not on file  Intimate Partner Violence: Not on file   PHYSICAL EXAM  Vitals:   11/14/20 1004  BP: 126/76  Pulse: 66  Weight: 198 lb (89.8 kg)  Height: 5\' 9"  (1.753 m)   Body mass index is 29.24 kg/m.  Generalized: Well developed, in no acute distress  Neurological examination  Mentation: Alert oriented to time, place, history taking. Follows all commands speech and language fluent Cranial nerve II-XII: Pupils were  equal round reactive to light. Extraocular movements were full, visual field were full on confrontational test. Facial sensation and strength were normal. Head turning and shoulder shrug  were normal and symmetric. Motor: Motor strength is intact, bilateral postural hand tremor, no rigidity. Moderate tremor translation to spiral draw, handwriting sample fairly well maintained Sensory: Dependent decreased light touch to mid shin  Coordination: Cerebellar testing reveals good finger-nose-finger and heel-to-shin bilaterally.  Moderate tremor bilaterally with finger-nose-finger Gait and station: Slightly wide-based, somewhat cautious, good arm swings, cautious with turns Reflexes: Deep tendon reflexes are depressed throughout  DIAGNOSTIC DATA (LABS, IMAGING, TESTING) - I reviewed patient records, labs, notes, testing and imaging myself where available.  Lab Results  Component Value Date   WBC 16.0 (H) 01/25/2018   HGB 17.6 (H) 01/25/2018   HCT 51.4 01/25/2018   MCV 92.9 01/25/2018   PLT 338 01/25/2018      Component Value Date/Time   NA 139 01/25/2018 2227   NA 139 03/05/2015 1144   K 4.6 01/25/2018 2227   CL 95 (L) 01/25/2018 2227   CO2 27 01/25/2018 2227   GLUCOSE 210 (H) 01/25/2018 2227   BUN 24 (H) 01/25/2018 2227   BUN 9 03/05/2015 1144   CREATININE 1.35 (H) 01/25/2018 2227   CALCIUM 10.1 01/25/2018 2227   PROT 9.0 (H) 01/25/2018 2227   PROT 6.8 03/05/2015 1144   ALBUMIN 4.5 01/25/2018 2227   ALBUMIN 4.1 03/05/2015 1144   AST 35 01/25/2018 2227   ALT 35  01/25/2018 2227   ALKPHOS 85 01/25/2018 2227   BILITOT 1.6 (H) 01/25/2018 2227   BILITOT 0.5 03/05/2015 1144   GFRNONAA 50 (L) 01/25/2018 2227   GFRAA 58 (L) 01/25/2018 2227   No results found for: CHOL, HDL, LDLCALC, LDLDIRECT, TRIG, CHOLHDL Lab Results  Component Value Date   HGBA1C 6.1 (H) 03/05/2015   Lab Results  Component Value Date   VITAMINB12 507 03/05/2015   Lab Results  Component Value Date   TSH 0.957 02/28/2016   ASSESSMENT AND PLAN 80 y.o. year old male  has a past medical history of BPH (benign prostatic hyperplasia), Neuromuscular disorder ([place]), Perforated ulcer ([place]) (1982), and Stroke ([place]). here with:  1.  Peripheral neuropathy -EMG nerve conduction study in 2017 showed mild axonal peripheral neuropathy -Continue gabapentin 800 mg 3 times a day -Continue Cymbalta 60 mg daily -Never started Trileptal due to fear of side effect -Remains satisfied with current pain control, on Dilaudid from pain clinic  2.  Slow worsening bilateral hand tremor -Mostly postural tremor, no parkinsonian features -Has not started Inderal due to fear of side effect -Will follow over time, follow-up in 1 year or sooner if needed  3.  History of stroke -Continue Plavix 75 mg daily, for secondary stroke prevention  Butler Denmark, AGNP-C, DNP 11/14/2020, 10:18 AM [facility] [address] [place], [place] [address] [phone]

## 2020-11-23 DIAGNOSIS — G894 Chronic pain syndrome: Secondary | ICD-10-CM | POA: Diagnosis not present

## 2021-02-21 DIAGNOSIS — R3912 Poor urinary stream: Secondary | ICD-10-CM | POA: Diagnosis not present

## 2021-02-21 DIAGNOSIS — R339 Retention of urine, unspecified: Secondary | ICD-10-CM | POA: Diagnosis not present

## 2021-03-20 DIAGNOSIS — D6869 Other thrombophilia: Secondary | ICD-10-CM | POA: Insufficient documentation

## 2021-03-20 DIAGNOSIS — M5136 Other intervertebral disc degeneration, lumbar region: Secondary | ICD-10-CM | POA: Diagnosis not present

## 2021-03-20 DIAGNOSIS — G894 Chronic pain syndrome: Secondary | ICD-10-CM | POA: Diagnosis not present

## 2021-03-20 DIAGNOSIS — Z79891 Long term (current) use of opiate analgesic: Secondary | ICD-10-CM | POA: Diagnosis not present

## 2021-03-20 DIAGNOSIS — Z8673 Personal history of transient ischemic attack (TIA), and cerebral infarction without residual deficits: Secondary | ICD-10-CM | POA: Diagnosis not present

## 2021-03-25 ENCOUNTER — Other Ambulatory Visit: Payer: Self-pay

## 2021-03-25 ENCOUNTER — Ambulatory Visit: Payer: Medicare PPO | Admitting: Dermatology

## 2021-03-25 DIAGNOSIS — C4441 Basal cell carcinoma of skin of scalp and neck: Secondary | ICD-10-CM | POA: Diagnosis not present

## 2021-03-25 DIAGNOSIS — L570 Actinic keratosis: Secondary | ICD-10-CM | POA: Diagnosis not present

## 2021-03-25 DIAGNOSIS — D485 Neoplasm of uncertain behavior of skin: Secondary | ICD-10-CM

## 2021-03-25 DIAGNOSIS — L821 Other seborrheic keratosis: Secondary | ICD-10-CM

## 2021-03-25 DIAGNOSIS — Z85828 Personal history of other malignant neoplasm of skin: Secondary | ICD-10-CM | POA: Diagnosis not present

## 2021-03-25 DIAGNOSIS — Z1283 Encounter for screening for malignant neoplasm of skin: Secondary | ICD-10-CM | POA: Diagnosis not present

## 2021-03-25 DIAGNOSIS — C4491 Basal cell carcinoma of skin, unspecified: Secondary | ICD-10-CM

## 2021-03-25 HISTORY — DX: Basal cell carcinoma of skin, unspecified: C44.91

## 2021-03-25 NOTE — Patient Instructions (Signed)

## 2021-03-26 DIAGNOSIS — Z5181 Encounter for therapeutic drug level monitoring: Secondary | ICD-10-CM | POA: Diagnosis not present

## 2021-03-26 DIAGNOSIS — Z79891 Long term (current) use of opiate analgesic: Secondary | ICD-10-CM | POA: Diagnosis not present

## 2021-03-28 ENCOUNTER — Telehealth: Payer: Self-pay

## 2021-03-28 NOTE — Telephone Encounter (Signed)
-----   Message from Lavonna Monarch, MD sent at 03/28/2021  6:57 AM EST ----- Schedule surgery with Dr. Darene Lamer

## 2021-03-28 NOTE — Telephone Encounter (Signed)
Phone call to patient with his pathology results. Voicemail left for patient to give the office a call back.  ?

## 2021-04-09 NOTE — Telephone Encounter (Signed)
-----   Message from Lavonna Monarch, MD sent at 03/28/2021  6:57 AM EST ----- Schedule surgery with Dr. Darene Lamer

## 2021-04-10 ENCOUNTER — Telehealth: Payer: Self-pay | Admitting: *Deleted

## 2021-04-10 NOTE — Telephone Encounter (Signed)
Pathology to patient-surgery appointment scheduled.  

## 2021-04-15 ENCOUNTER — Ambulatory Visit: Payer: Medicare PPO | Admitting: Dermatology

## 2021-04-17 ENCOUNTER — Encounter: Payer: Self-pay | Admitting: Dermatology

## 2021-04-18 NOTE — Progress Notes (Signed)
Follow-Up Visit   Subjective  David Li is a 80 y.o. male who presents for the following: Annual Exam (Here for annual skin exam. Concerns patients neck. History of non mole skin cancers. ).  Annual skin examination, several spots of concern Location:  Duration:  Quality:  Associated Signs/Symptoms: Modifying Factors:  Severity:  Timing: Context:   Objective  Well appearing patient in no apparent distress; mood and affect are within normal limits. General skin examination, 1 questionable pigmented spot left jawline and 2 possible nonmelanoma skin cancers will be biopsied today.  Right Anterior Neck Pearly 6 mm focally eroded papule, BCC       Right Upper Arm - Anterior Pearly 5 mm papule, rule out BCC       Left Anterior Mandible Black 5 mm macule, dermoscopy amorphous but difficult because of beard       Right Hand - Posterior Hornlike 4 mm papule  Head - Anterior (Face) Scar is clear    All skin waist up examined.   Assessment & Plan    Neoplasm of uncertain behavior of skin (3) Right Anterior Neck  Skin / nail biopsy Type of biopsy: tangential   Informed consent: discussed and consent obtained   Timeout: patient name, date of birth, surgical site, and procedure verified   Anesthesia: the lesion was anesthetized in a standard fashion   Anesthetic:  1% lidocaine w/ epinephrine 1-100,000 local infiltration Instrument used: flexible razor blade   Hemostasis achieved with: ferric subsulfate   Outcome: patient tolerated procedure well   Post-procedure details: wound care instructions given    Specimen 1 - Surgical pathology Differential Diagnosis: scc vs bcc  Check Margins: No  Right Upper Arm - Anterior  Skin / nail biopsy Type of biopsy: tangential   Informed consent: discussed and consent obtained   Timeout: patient name, date of birth, surgical site, and procedure verified   Anesthesia: the lesion was anesthetized in a  standard fashion   Anesthetic:  1% lidocaine w/ epinephrine 1-100,000 local infiltration Instrument used: flexible razor blade   Hemostasis achieved with: ferric subsulfate   Outcome: patient tolerated procedure well   Post-procedure details: wound care instructions given    Specimen 2 - Surgical pathology Differential Diagnosis: scc vs bcc  Check Margins: No  Left Anterior Mandible  Skin / nail biopsy Type of biopsy: tangential   Informed consent: discussed and consent obtained   Timeout: patient name, date of birth, surgical site, and procedure verified   Anesthesia: the lesion was anesthetized in a standard fashion   Anesthetic:  1% lidocaine w/ epinephrine 1-100,000 local infiltration Instrument used: flexible razor blade   Hemostasis achieved with: ferric subsulfate   Outcome: patient tolerated procedure well   Post-procedure details: wound care instructions given    Specimen 3 - Surgical pathology Differential Diagnosis: scc vs bcc  Check Margins: No  AK (actinic keratosis) Right Hand - Posterior  Destruction of lesion - Right Hand - Posterior Complexity: simple   Destruction method: cryotherapy   Informed consent: discussed and consent obtained   Timeout:  patient name, date of birth, surgical site, and procedure verified Lesion destroyed using liquid nitrogen: Yes   Cryotherapy cycles:  1 Outcome: patient tolerated procedure well with no complications   Post-procedure details: wound care instructions given    History of basal cell carcinoma (BCC) of skin Head - Anterior (Face)  Yearly skin exams.   Encounter for screening for malignant neoplasm of skin  Annual skin examination.  I, Lavonna Monarch, MD, have reviewed all documentation for this visit.  The documentation on 04/18/21 for the exam, diagnosis, procedures, and orders are all accurate and complete.

## 2021-04-19 DIAGNOSIS — I10 Essential (primary) hypertension: Secondary | ICD-10-CM | POA: Diagnosis not present

## 2021-04-19 DIAGNOSIS — E7800 Pure hypercholesterolemia, unspecified: Secondary | ICD-10-CM | POA: Diagnosis not present

## 2021-04-19 DIAGNOSIS — G629 Polyneuropathy, unspecified: Secondary | ICD-10-CM | POA: Diagnosis not present

## 2021-04-19 DIAGNOSIS — R251 Tremor, unspecified: Secondary | ICD-10-CM | POA: Diagnosis not present

## 2021-04-19 DIAGNOSIS — Z0000 Encounter for general adult medical examination without abnormal findings: Secondary | ICD-10-CM | POA: Diagnosis not present

## 2021-04-19 DIAGNOSIS — Z79899 Other long term (current) drug therapy: Secondary | ICD-10-CM | POA: Diagnosis not present

## 2021-04-19 DIAGNOSIS — F325 Major depressive disorder, single episode, in full remission: Secondary | ICD-10-CM | POA: Diagnosis not present

## 2021-04-19 DIAGNOSIS — N400 Enlarged prostate without lower urinary tract symptoms: Secondary | ICD-10-CM | POA: Diagnosis not present

## 2021-04-19 DIAGNOSIS — G894 Chronic pain syndrome: Secondary | ICD-10-CM | POA: Diagnosis not present

## 2021-04-19 DIAGNOSIS — E1169 Type 2 diabetes mellitus with other specified complication: Secondary | ICD-10-CM | POA: Diagnosis not present

## 2021-04-23 DIAGNOSIS — E1169 Type 2 diabetes mellitus with other specified complication: Secondary | ICD-10-CM | POA: Diagnosis not present

## 2021-06-13 ENCOUNTER — Other Ambulatory Visit: Payer: Self-pay

## 2021-06-13 ENCOUNTER — Encounter: Payer: Self-pay | Admitting: Dermatology

## 2021-06-13 ENCOUNTER — Ambulatory Visit (INDEPENDENT_AMBULATORY_CARE_PROVIDER_SITE_OTHER): Payer: Medicare PPO | Admitting: Dermatology

## 2021-06-13 DIAGNOSIS — C4441 Basal cell carcinoma of skin of scalp and neck: Secondary | ICD-10-CM

## 2021-06-13 DIAGNOSIS — L820 Inflamed seborrheic keratosis: Secondary | ICD-10-CM | POA: Diagnosis not present

## 2021-06-13 DIAGNOSIS — D485 Neoplasm of uncertain behavior of skin: Secondary | ICD-10-CM

## 2021-06-13 DIAGNOSIS — C44319 Basal cell carcinoma of skin of other parts of face: Secondary | ICD-10-CM

## 2021-06-13 NOTE — Patient Instructions (Signed)

## 2021-07-06 ENCOUNTER — Encounter: Payer: Self-pay | Admitting: Dermatology

## 2021-07-06 NOTE — Progress Notes (Signed)
° °  Follow-Up Visit   Subjective  David Li is a 81 y.o. male who presents for the following: Procedure (Patient here today for treatment of BCC x 1 right anterior neck.).  Biopsy-proven skin cancer neck plus new lesion. Location:  Duration:  Quality:  Associated Signs/Symptoms: Modifying Factors:  Severity:  Timing: Context:   Objective  Well appearing patient in no apparent distress; mood and affect are within normal limits. Neck - Anterior 9 mm crust with history of enlargement  Right Anterior Neck Biopsy site identified by nurse and me.    A focused examination was performed including head and neck. Relevant physical exam findings are noted in the Assessment and Plan.   Assessment & Plan    Neoplasm of uncertain behavior of skin Neck - Anterior  Skin / nail biopsy Type of biopsy: tangential   Informed consent: discussed and consent obtained   Timeout: patient name, date of birth, surgical site, and procedure verified   Anesthesia: the lesion was anesthetized in a standard fashion   Anesthetic:  1% lidocaine w/ epinephrine 1-100,000 local infiltration Instrument used: flexible razor blade   Hemostasis achieved with: ferric subsulfate and electrodesiccation   Outcome: patient tolerated procedure well   Post-procedure details: wound care instructions given    Specimen 1 - Surgical pathology Differential Diagnosis: R/O ISK  Check Margins: No  Basal cell carcinoma (BCC) of right side of neck Right Anterior Neck  Destruction of lesion Complexity: simple   Destruction method: electrodesiccation and curettage   Informed consent: discussed and consent obtained   Timeout:  patient name, date of birth, surgical site, and procedure verified Anesthesia: the lesion was anesthetized in a standard fashion   Anesthetic:  1% lidocaine w/ epinephrine 1-100,000 local infiltration Curettage performed in three different directions: Yes   Curettage cycles:  3 Lesion  length (cm):  1.5 Lesion width (cm):  1.5 Margin per side (cm):  0 Final wound size (cm):  1.5 Hemostasis achieved with:  ferric subsulfate Outcome: patient tolerated procedure well with no complications   Post-procedure details: sterile dressing applied and wound care instructions given   Dressing type: bandage and petrolatum   Additional details:  Wound inoculated with 5% Fluorouracil.    This appears to be more wide than deep, I did discuss Mohs surgery (which he has had previously) before proceeding.  He preferred I do the surgery and leave Mohs as a plan B.      I, Lavonna Monarch, MD, have reviewed all documentation for this visit.  The documentation on 07/06/21 for the exam, diagnosis, procedures, and orders are all accurate and complete.

## 2021-07-06 NOTE — Progress Notes (Signed)
° °  Follow-Up Visit   Subjective  David Li is a 81 y.o. male who presents for the following: Procedure (Patient here today for treatment of BCC x 1 right anterior cheek. ).  Biopsy-proven skin cancer right neck and new spot left neck Location:  Duration:  Quality:  Associated Signs/Symptoms: Modifying Factors:  Severity:  Timing: Context:   Objective  Well appearing patient in no apparent distress; mood and affect are within normal limits. Neck - Anterior 9 mm crust with history of enlargement  Right Anterior Neck Biopsy site identified by nurse and me.    A focused examination was performed including head and neck. Relevant physical exam findings are noted in the Assessment and Plan.   Assessment & Plan    Neoplasm of uncertain behavior of skin Neck - Anterior  Skin / nail biopsy Type of biopsy: tangential   Informed consent: discussed and consent obtained   Timeout: patient name, date of birth, surgical site, and procedure verified   Anesthesia: the lesion was anesthetized in a standard fashion   Anesthetic:  1% lidocaine w/ epinephrine 1-100,000 local infiltration Instrument used: flexible razor blade   Hemostasis achieved with: ferric subsulfate and electrodesiccation   Outcome: patient tolerated procedure well   Post-procedure details: wound care instructions given    Specimen 1 - Surgical pathology Differential Diagnosis: R/O ISK  Check Margins: No  Basal cell carcinoma (BCC) of right side of neck Right Anterior Neck  Destruction of lesion Complexity: simple   Destruction method: electrodesiccation and curettage   Informed consent: discussed and consent obtained   Timeout:  patient name, date of birth, surgical site, and procedure verified Anesthesia: the lesion was anesthetized in a standard fashion   Anesthetic:  1% lidocaine w/ epinephrine 1-100,000 local infiltration Curettage performed in three different directions: Yes   Curettage  cycles:  3 Lesion length (cm):  1.5 Lesion width (cm):  1.5 Margin per side (cm):  0 Final wound size (cm):  1.5 Hemostasis achieved with:  ferric subsulfate Outcome: patient tolerated procedure well with no complications   Post-procedure details: sterile dressing applied and wound care instructions given   Dressing type: bandage and petrolatum   Additional details:  Wound inoculated with 5% Fluorouracil.    This appears to be more wide than deep, I did discuss Mohs surgery (which he has had previously) before proceeding.  He preferred I do the surgery and leave Mohs as a plan B.      I, Lavonna Monarch, MD, have reviewed all documentation for this visit.  The documentation on 07/06/21 for the exam, diagnosis, procedures, and orders are all accurate and complete.

## 2021-07-18 DIAGNOSIS — Z79891 Long term (current) use of opiate analgesic: Secondary | ICD-10-CM | POA: Diagnosis not present

## 2021-07-18 DIAGNOSIS — M961 Postlaminectomy syndrome, not elsewhere classified: Secondary | ICD-10-CM | POA: Insufficient documentation

## 2021-07-18 DIAGNOSIS — D6869 Other thrombophilia: Secondary | ICD-10-CM | POA: Diagnosis not present

## 2021-09-10 DIAGNOSIS — R278 Other lack of coordination: Secondary | ICD-10-CM | POA: Diagnosis not present

## 2021-09-10 DIAGNOSIS — R200 Anesthesia of skin: Secondary | ICD-10-CM | POA: Diagnosis not present

## 2021-09-10 DIAGNOSIS — R531 Weakness: Secondary | ICD-10-CM | POA: Diagnosis not present

## 2021-09-10 DIAGNOSIS — E1140 Type 2 diabetes mellitus with diabetic neuropathy, unspecified: Secondary | ICD-10-CM | POA: Diagnosis not present

## 2021-09-10 DIAGNOSIS — M79604 Pain in right leg: Secondary | ICD-10-CM | POA: Diagnosis not present

## 2021-09-10 DIAGNOSIS — G250 Essential tremor: Secondary | ICD-10-CM | POA: Diagnosis not present

## 2021-09-10 DIAGNOSIS — M79605 Pain in left leg: Secondary | ICD-10-CM | POA: Diagnosis not present

## 2021-09-10 DIAGNOSIS — R202 Paresthesia of skin: Secondary | ICD-10-CM | POA: Diagnosis not present

## 2021-09-11 DIAGNOSIS — R202 Paresthesia of skin: Secondary | ICD-10-CM | POA: Diagnosis not present

## 2021-09-11 DIAGNOSIS — M79604 Pain in right leg: Secondary | ICD-10-CM | POA: Diagnosis not present

## 2021-09-11 DIAGNOSIS — G250 Essential tremor: Secondary | ICD-10-CM | POA: Diagnosis not present

## 2021-09-11 DIAGNOSIS — R200 Anesthesia of skin: Secondary | ICD-10-CM | POA: Diagnosis not present

## 2021-09-11 DIAGNOSIS — E1140 Type 2 diabetes mellitus with diabetic neuropathy, unspecified: Secondary | ICD-10-CM | POA: Diagnosis not present

## 2021-09-11 DIAGNOSIS — R531 Weakness: Secondary | ICD-10-CM | POA: Diagnosis not present

## 2021-09-11 DIAGNOSIS — M79605 Pain in left leg: Secondary | ICD-10-CM | POA: Diagnosis not present

## 2021-09-11 DIAGNOSIS — R278 Other lack of coordination: Secondary | ICD-10-CM | POA: Diagnosis not present

## 2021-09-13 DIAGNOSIS — R202 Paresthesia of skin: Secondary | ICD-10-CM | POA: Diagnosis not present

## 2021-09-13 DIAGNOSIS — R200 Anesthesia of skin: Secondary | ICD-10-CM | POA: Diagnosis not present

## 2021-09-13 DIAGNOSIS — E1140 Type 2 diabetes mellitus with diabetic neuropathy, unspecified: Secondary | ICD-10-CM | POA: Diagnosis not present

## 2021-09-13 DIAGNOSIS — R531 Weakness: Secondary | ICD-10-CM | POA: Diagnosis not present

## 2021-09-13 DIAGNOSIS — R278 Other lack of coordination: Secondary | ICD-10-CM | POA: Diagnosis not present

## 2021-09-13 DIAGNOSIS — M79604 Pain in right leg: Secondary | ICD-10-CM | POA: Diagnosis not present

## 2021-09-13 DIAGNOSIS — M79605 Pain in left leg: Secondary | ICD-10-CM | POA: Diagnosis not present

## 2021-09-13 DIAGNOSIS — G250 Essential tremor: Secondary | ICD-10-CM | POA: Diagnosis not present

## 2021-09-16 DIAGNOSIS — M79605 Pain in left leg: Secondary | ICD-10-CM | POA: Diagnosis not present

## 2021-09-16 DIAGNOSIS — R531 Weakness: Secondary | ICD-10-CM | POA: Diagnosis not present

## 2021-09-16 DIAGNOSIS — R200 Anesthesia of skin: Secondary | ICD-10-CM | POA: Diagnosis not present

## 2021-09-16 DIAGNOSIS — M79604 Pain in right leg: Secondary | ICD-10-CM | POA: Diagnosis not present

## 2021-09-16 DIAGNOSIS — R278 Other lack of coordination: Secondary | ICD-10-CM | POA: Diagnosis not present

## 2021-09-16 DIAGNOSIS — E1140 Type 2 diabetes mellitus with diabetic neuropathy, unspecified: Secondary | ICD-10-CM | POA: Diagnosis not present

## 2021-09-16 DIAGNOSIS — R202 Paresthesia of skin: Secondary | ICD-10-CM | POA: Diagnosis not present

## 2021-09-16 DIAGNOSIS — G250 Essential tremor: Secondary | ICD-10-CM | POA: Diagnosis not present

## 2021-09-17 DIAGNOSIS — M79604 Pain in right leg: Secondary | ICD-10-CM | POA: Diagnosis not present

## 2021-09-17 DIAGNOSIS — R202 Paresthesia of skin: Secondary | ICD-10-CM | POA: Diagnosis not present

## 2021-09-17 DIAGNOSIS — R200 Anesthesia of skin: Secondary | ICD-10-CM | POA: Diagnosis not present

## 2021-09-17 DIAGNOSIS — E1140 Type 2 diabetes mellitus with diabetic neuropathy, unspecified: Secondary | ICD-10-CM | POA: Diagnosis not present

## 2021-09-17 DIAGNOSIS — M79605 Pain in left leg: Secondary | ICD-10-CM | POA: Diagnosis not present

## 2021-09-17 DIAGNOSIS — G250 Essential tremor: Secondary | ICD-10-CM | POA: Diagnosis not present

## 2021-09-17 DIAGNOSIS — R531 Weakness: Secondary | ICD-10-CM | POA: Diagnosis not present

## 2021-09-17 DIAGNOSIS — R278 Other lack of coordination: Secondary | ICD-10-CM | POA: Diagnosis not present

## 2021-09-23 DIAGNOSIS — M79605 Pain in left leg: Secondary | ICD-10-CM | POA: Diagnosis not present

## 2021-09-23 DIAGNOSIS — G250 Essential tremor: Secondary | ICD-10-CM | POA: Diagnosis not present

## 2021-09-23 DIAGNOSIS — R278 Other lack of coordination: Secondary | ICD-10-CM | POA: Diagnosis not present

## 2021-09-23 DIAGNOSIS — R531 Weakness: Secondary | ICD-10-CM | POA: Diagnosis not present

## 2021-09-23 DIAGNOSIS — E1140 Type 2 diabetes mellitus with diabetic neuropathy, unspecified: Secondary | ICD-10-CM | POA: Diagnosis not present

## 2021-09-23 DIAGNOSIS — R200 Anesthesia of skin: Secondary | ICD-10-CM | POA: Diagnosis not present

## 2021-09-23 DIAGNOSIS — R202 Paresthesia of skin: Secondary | ICD-10-CM | POA: Diagnosis not present

## 2021-09-23 DIAGNOSIS — M79604 Pain in right leg: Secondary | ICD-10-CM | POA: Diagnosis not present

## 2021-09-25 DIAGNOSIS — R202 Paresthesia of skin: Secondary | ICD-10-CM | POA: Diagnosis not present

## 2021-09-25 DIAGNOSIS — E1140 Type 2 diabetes mellitus with diabetic neuropathy, unspecified: Secondary | ICD-10-CM | POA: Diagnosis not present

## 2021-09-25 DIAGNOSIS — R200 Anesthesia of skin: Secondary | ICD-10-CM | POA: Diagnosis not present

## 2021-09-25 DIAGNOSIS — M79604 Pain in right leg: Secondary | ICD-10-CM | POA: Diagnosis not present

## 2021-09-25 DIAGNOSIS — M79605 Pain in left leg: Secondary | ICD-10-CM | POA: Diagnosis not present

## 2021-09-25 DIAGNOSIS — R278 Other lack of coordination: Secondary | ICD-10-CM | POA: Diagnosis not present

## 2021-09-25 DIAGNOSIS — G250 Essential tremor: Secondary | ICD-10-CM | POA: Diagnosis not present

## 2021-09-25 DIAGNOSIS — R531 Weakness: Secondary | ICD-10-CM | POA: Diagnosis not present

## 2021-09-27 DIAGNOSIS — E1140 Type 2 diabetes mellitus with diabetic neuropathy, unspecified: Secondary | ICD-10-CM | POA: Diagnosis not present

## 2021-09-27 DIAGNOSIS — G250 Essential tremor: Secondary | ICD-10-CM | POA: Diagnosis not present

## 2021-09-27 DIAGNOSIS — M79605 Pain in left leg: Secondary | ICD-10-CM | POA: Diagnosis not present

## 2021-09-27 DIAGNOSIS — R531 Weakness: Secondary | ICD-10-CM | POA: Diagnosis not present

## 2021-09-27 DIAGNOSIS — R202 Paresthesia of skin: Secondary | ICD-10-CM | POA: Diagnosis not present

## 2021-09-27 DIAGNOSIS — R278 Other lack of coordination: Secondary | ICD-10-CM | POA: Diagnosis not present

## 2021-09-27 DIAGNOSIS — M79604 Pain in right leg: Secondary | ICD-10-CM | POA: Diagnosis not present

## 2021-09-27 DIAGNOSIS — R200 Anesthesia of skin: Secondary | ICD-10-CM | POA: Diagnosis not present

## 2021-09-30 DIAGNOSIS — M79605 Pain in left leg: Secondary | ICD-10-CM | POA: Diagnosis not present

## 2021-09-30 DIAGNOSIS — M79604 Pain in right leg: Secondary | ICD-10-CM | POA: Diagnosis not present

## 2021-09-30 DIAGNOSIS — R200 Anesthesia of skin: Secondary | ICD-10-CM | POA: Diagnosis not present

## 2021-09-30 DIAGNOSIS — R531 Weakness: Secondary | ICD-10-CM | POA: Diagnosis not present

## 2021-09-30 DIAGNOSIS — R278 Other lack of coordination: Secondary | ICD-10-CM | POA: Diagnosis not present

## 2021-09-30 DIAGNOSIS — G250 Essential tremor: Secondary | ICD-10-CM | POA: Diagnosis not present

## 2021-09-30 DIAGNOSIS — R202 Paresthesia of skin: Secondary | ICD-10-CM | POA: Diagnosis not present

## 2021-09-30 DIAGNOSIS — E1140 Type 2 diabetes mellitus with diabetic neuropathy, unspecified: Secondary | ICD-10-CM | POA: Diagnosis not present

## 2021-10-02 DIAGNOSIS — R278 Other lack of coordination: Secondary | ICD-10-CM | POA: Diagnosis not present

## 2021-10-02 DIAGNOSIS — R202 Paresthesia of skin: Secondary | ICD-10-CM | POA: Diagnosis not present

## 2021-10-02 DIAGNOSIS — R200 Anesthesia of skin: Secondary | ICD-10-CM | POA: Diagnosis not present

## 2021-10-02 DIAGNOSIS — E1140 Type 2 diabetes mellitus with diabetic neuropathy, unspecified: Secondary | ICD-10-CM | POA: Diagnosis not present

## 2021-10-02 DIAGNOSIS — M79605 Pain in left leg: Secondary | ICD-10-CM | POA: Diagnosis not present

## 2021-10-02 DIAGNOSIS — M79604 Pain in right leg: Secondary | ICD-10-CM | POA: Diagnosis not present

## 2021-10-02 DIAGNOSIS — R531 Weakness: Secondary | ICD-10-CM | POA: Diagnosis not present

## 2021-10-02 DIAGNOSIS — G250 Essential tremor: Secondary | ICD-10-CM | POA: Diagnosis not present

## 2021-10-04 DIAGNOSIS — G250 Essential tremor: Secondary | ICD-10-CM | POA: Diagnosis not present

## 2021-10-04 DIAGNOSIS — R200 Anesthesia of skin: Secondary | ICD-10-CM | POA: Diagnosis not present

## 2021-10-04 DIAGNOSIS — R531 Weakness: Secondary | ICD-10-CM | POA: Diagnosis not present

## 2021-10-04 DIAGNOSIS — M79605 Pain in left leg: Secondary | ICD-10-CM | POA: Diagnosis not present

## 2021-10-04 DIAGNOSIS — E1140 Type 2 diabetes mellitus with diabetic neuropathy, unspecified: Secondary | ICD-10-CM | POA: Diagnosis not present

## 2021-10-04 DIAGNOSIS — R202 Paresthesia of skin: Secondary | ICD-10-CM | POA: Diagnosis not present

## 2021-10-04 DIAGNOSIS — R278 Other lack of coordination: Secondary | ICD-10-CM | POA: Diagnosis not present

## 2021-10-04 DIAGNOSIS — M79604 Pain in right leg: Secondary | ICD-10-CM | POA: Diagnosis not present

## 2021-10-09 DIAGNOSIS — R278 Other lack of coordination: Secondary | ICD-10-CM | POA: Diagnosis not present

## 2021-10-09 DIAGNOSIS — R202 Paresthesia of skin: Secondary | ICD-10-CM | POA: Diagnosis not present

## 2021-10-09 DIAGNOSIS — R531 Weakness: Secondary | ICD-10-CM | POA: Diagnosis not present

## 2021-10-09 DIAGNOSIS — E1140 Type 2 diabetes mellitus with diabetic neuropathy, unspecified: Secondary | ICD-10-CM | POA: Diagnosis not present

## 2021-10-09 DIAGNOSIS — M79605 Pain in left leg: Secondary | ICD-10-CM | POA: Diagnosis not present

## 2021-10-09 DIAGNOSIS — R200 Anesthesia of skin: Secondary | ICD-10-CM | POA: Diagnosis not present

## 2021-10-09 DIAGNOSIS — M79604 Pain in right leg: Secondary | ICD-10-CM | POA: Diagnosis not present

## 2021-10-09 DIAGNOSIS — G250 Essential tremor: Secondary | ICD-10-CM | POA: Diagnosis not present

## 2021-10-11 DIAGNOSIS — R531 Weakness: Secondary | ICD-10-CM | POA: Diagnosis not present

## 2021-10-11 DIAGNOSIS — R202 Paresthesia of skin: Secondary | ICD-10-CM | POA: Diagnosis not present

## 2021-10-11 DIAGNOSIS — E1140 Type 2 diabetes mellitus with diabetic neuropathy, unspecified: Secondary | ICD-10-CM | POA: Diagnosis not present

## 2021-10-11 DIAGNOSIS — R200 Anesthesia of skin: Secondary | ICD-10-CM | POA: Diagnosis not present

## 2021-10-11 DIAGNOSIS — R278 Other lack of coordination: Secondary | ICD-10-CM | POA: Diagnosis not present

## 2021-10-11 DIAGNOSIS — G250 Essential tremor: Secondary | ICD-10-CM | POA: Diagnosis not present

## 2021-10-11 DIAGNOSIS — M79604 Pain in right leg: Secondary | ICD-10-CM | POA: Diagnosis not present

## 2021-10-11 DIAGNOSIS — M79605 Pain in left leg: Secondary | ICD-10-CM | POA: Diagnosis not present

## 2021-10-14 DIAGNOSIS — R531 Weakness: Secondary | ICD-10-CM | POA: Diagnosis not present

## 2021-10-14 DIAGNOSIS — R202 Paresthesia of skin: Secondary | ICD-10-CM | POA: Diagnosis not present

## 2021-10-14 DIAGNOSIS — M79604 Pain in right leg: Secondary | ICD-10-CM | POA: Diagnosis not present

## 2021-10-14 DIAGNOSIS — M79605 Pain in left leg: Secondary | ICD-10-CM | POA: Diagnosis not present

## 2021-10-14 DIAGNOSIS — G250 Essential tremor: Secondary | ICD-10-CM | POA: Diagnosis not present

## 2021-10-14 DIAGNOSIS — R200 Anesthesia of skin: Secondary | ICD-10-CM | POA: Diagnosis not present

## 2021-10-14 DIAGNOSIS — E1140 Type 2 diabetes mellitus with diabetic neuropathy, unspecified: Secondary | ICD-10-CM | POA: Diagnosis not present

## 2021-10-14 DIAGNOSIS — R278 Other lack of coordination: Secondary | ICD-10-CM | POA: Diagnosis not present

## 2021-10-16 DIAGNOSIS — R200 Anesthesia of skin: Secondary | ICD-10-CM | POA: Diagnosis not present

## 2021-10-16 DIAGNOSIS — R202 Paresthesia of skin: Secondary | ICD-10-CM | POA: Diagnosis not present

## 2021-10-16 DIAGNOSIS — M79605 Pain in left leg: Secondary | ICD-10-CM | POA: Diagnosis not present

## 2021-10-16 DIAGNOSIS — M79604 Pain in right leg: Secondary | ICD-10-CM | POA: Diagnosis not present

## 2021-10-16 DIAGNOSIS — G250 Essential tremor: Secondary | ICD-10-CM | POA: Diagnosis not present

## 2021-10-16 DIAGNOSIS — R278 Other lack of coordination: Secondary | ICD-10-CM | POA: Diagnosis not present

## 2021-10-16 DIAGNOSIS — E1140 Type 2 diabetes mellitus with diabetic neuropathy, unspecified: Secondary | ICD-10-CM | POA: Diagnosis not present

## 2021-10-16 DIAGNOSIS — R531 Weakness: Secondary | ICD-10-CM | POA: Diagnosis not present

## 2021-11-17 ENCOUNTER — Other Ambulatory Visit: Payer: Self-pay | Admitting: Neurology

## 2021-11-18 ENCOUNTER — Ambulatory Visit: Payer: Medicare PPO | Admitting: Neurology

## 2021-11-19 ENCOUNTER — Encounter: Payer: Self-pay | Admitting: Neurology

## 2021-11-19 ENCOUNTER — Ambulatory Visit: Payer: Medicare PPO | Admitting: Neurology

## 2021-11-19 VITALS — BP 156/79 | HR 69 | Ht 69.0 in | Wt 207.5 lb

## 2021-11-19 DIAGNOSIS — I639 Cerebral infarction, unspecified: Secondary | ICD-10-CM | POA: Insufficient documentation

## 2021-11-19 DIAGNOSIS — G5620 Lesion of ulnar nerve, unspecified upper limb: Secondary | ICD-10-CM | POA: Insufficient documentation

## 2021-11-19 DIAGNOSIS — E46 Unspecified protein-calorie malnutrition: Secondary | ICD-10-CM | POA: Insufficient documentation

## 2021-11-19 DIAGNOSIS — M353 Polymyalgia rheumatica: Secondary | ICD-10-CM | POA: Insufficient documentation

## 2021-11-19 DIAGNOSIS — E7800 Pure hypercholesterolemia, unspecified: Secondary | ICD-10-CM | POA: Insufficient documentation

## 2021-11-19 DIAGNOSIS — W19XXXA Unspecified fall, initial encounter: Secondary | ICD-10-CM | POA: Insufficient documentation

## 2021-11-19 DIAGNOSIS — Z8711 Personal history of peptic ulcer disease: Secondary | ICD-10-CM | POA: Insufficient documentation

## 2021-11-19 DIAGNOSIS — F325 Major depressive disorder, single episode, in full remission: Secondary | ICD-10-CM | POA: Insufficient documentation

## 2021-11-19 DIAGNOSIS — Z9079 Acquired absence of other genital organ(s): Secondary | ICD-10-CM | POA: Insufficient documentation

## 2021-11-19 DIAGNOSIS — I10 Essential (primary) hypertension: Secondary | ICD-10-CM | POA: Insufficient documentation

## 2021-11-19 DIAGNOSIS — G609 Hereditary and idiopathic neuropathy, unspecified: Secondary | ICD-10-CM | POA: Diagnosis not present

## 2021-11-19 DIAGNOSIS — B009 Herpesviral infection, unspecified: Secondary | ICD-10-CM | POA: Insufficient documentation

## 2021-11-19 DIAGNOSIS — M316 Other giant cell arteritis: Secondary | ICD-10-CM | POA: Insufficient documentation

## 2021-11-19 DIAGNOSIS — I119 Hypertensive heart disease without heart failure: Secondary | ICD-10-CM | POA: Insufficient documentation

## 2021-11-19 DIAGNOSIS — G4700 Insomnia, unspecified: Secondary | ICD-10-CM | POA: Insufficient documentation

## 2021-11-19 DIAGNOSIS — N400 Enlarged prostate without lower urinary tract symptoms: Secondary | ICD-10-CM | POA: Insufficient documentation

## 2021-11-19 DIAGNOSIS — R739 Hyperglycemia, unspecified: Secondary | ICD-10-CM | POA: Insufficient documentation

## 2021-11-19 DIAGNOSIS — G250 Essential tremor: Secondary | ICD-10-CM

## 2021-11-19 DIAGNOSIS — N401 Enlarged prostate with lower urinary tract symptoms: Secondary | ICD-10-CM | POA: Insufficient documentation

## 2021-11-19 DIAGNOSIS — E1169 Type 2 diabetes mellitus with other specified complication: Secondary | ICD-10-CM | POA: Insufficient documentation

## 2021-11-19 DIAGNOSIS — J309 Allergic rhinitis, unspecified: Secondary | ICD-10-CM | POA: Insufficient documentation

## 2021-11-19 DIAGNOSIS — R251 Tremor, unspecified: Secondary | ICD-10-CM | POA: Insufficient documentation

## 2021-11-19 DIAGNOSIS — K219 Gastro-esophageal reflux disease without esophagitis: Secondary | ICD-10-CM | POA: Insufficient documentation

## 2021-11-19 DIAGNOSIS — D692 Other nonthrombocytopenic purpura: Secondary | ICD-10-CM | POA: Insufficient documentation

## 2021-11-19 MED ORDER — DULOXETINE HCL 60 MG PO CPEP: 60.0000 mg | ORAL_CAPSULE | Freq: Every day | ORAL | 4 refills | Status: DC

## 2021-11-19 MED ORDER — PROPRANOLOL HCL 20 MG PO TABS: 20.0000 mg | ORAL_TABLET | Freq: Two times a day (BID) | ORAL | 6 refills | Status: DC

## 2021-11-19 MED ORDER — GABAPENTIN 800 MG PO TABS: 800.0000 mg | ORAL_TABLET | Freq: Three times a day (TID) | ORAL | 4 refills | Status: DC

## 2021-11-19 MED ORDER — CLOPIDOGREL BISULFATE 75 MG PO TABS: 75.0000 mg | ORAL_TABLET | Freq: Every day | ORAL | 3 refills | Status: DC

## 2021-11-19 NOTE — Progress Notes (Signed)
PATIENT: David Li DOB: 1941/03/19  REASON FOR VISIT: follow up HISTORY FROM: patient Primary Neurologist: Dr. Krista Blue   HISTORY [name], is a 81 yo white male return for follow up of idiopathic peripheral neuropathy.    He was followed by Dr. Doy Mince since April 2006, when he presented with sensory changes starting at the soles of his feet, gradually ascending to bilateral lower extremities, fairly symmetric. NCV and spine imaging were negative then.  In August 2006 he was hospitalized with fever and myalgias, and temporal artery biopsy demonstrated vasculitis. He has since been treated with methotrexate and prednisone, with good response of vasculitis. He is no longer on immunosuppressive treatment.  His neuropathy has progressed by exam and followup NCV. Symptoms have been fairly well controlled on gabapentin 600 mg 3 times a day and nortriptyline 25 mg every night. He had a few bouts of double vision a few years ago, thought likely due to vasculitis. He had a spell of dizziness in 2008, negative imaging at that time, seen in the ER by Dr. Erling Cruz and treated with Plavix for possible TIA.    Last visit was in October 2015 by nurse practitioner [name], he continued to complains of slow progression of numbness, tingling, neuropathic pain, now numbness at both mid thigh level. 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, enjoys reading and learning   He is taking titrating dose of gabapentin, currently gabapentin 600 mg 3 times a day, nortriptyline 25 mg 1 tablet at night.   He has gradual worsening bilateral lower extremity paresthesia now up to mid thigh level,   UPDATE May 28 2015:YY He continues to complains of burning at the bottom of his feet, he is taking Lyrica, which seems to help, he is planning on to  move to gabapentin '600mg'$  tid to see which one helps him better He has mild low back pain,no radiating pain to his legs, he has to self cath once a day since 2016, this happened following his previous TURP surgery, he has difficulty completely empty his bladder, he also complains of worsening constipation.   He feel hand swollen, tightness, he complains of worsening hand writing. CSF showed total elevated total protein 63, normal glucose, normal cells   EMG nerve conduction study January 2017 showed mild axonal peripheral neuropathy, no evidence of demyelinating features   UPDATE July 18th 2017: He continue complains of intermittent bilateral feet and hand low-grade paresthesia, despite taking gabapentin 600 mg 3 times a day, denied weakness, mildly difficulty with tandem walking   UPDATE November 16 2018: He is overall doing well, continue taking polypharmacy for his peripheral neuropathy, gabapentin 800 mg 3 times a day, Cymbalta 60 mg daily, most recent EMG nerve conduction study was in January 2017, only mild axonal peripheral neuropathy, I have suggested repeat electrodiagnostic study, he does not want to do it at this point   Update June 21, 2019: He continue have significant neuropathic pain involving bilateral lower extremity from knee down, taking gabapentin 800 mg 3 times a day, Cymbalta 60 mg daily, also received pain management from Dr. Nelva Bush oxycodone 10 mg 3-4 times daily, 4 out of 10 constant neuropathic pain involving bilateral lower extremity,   In addition, he complains of slow worsening bilateral hands tremor, there was no family history of tremor, he noticed gradual  onset bilateral hands tremor since 2015, now he has difficulty holding a screwdriver, spill his drinks, difficulty writing, reported normal laboratory evaluations in September 2020, likely including thyroid functional test,  Update November 10, 2019 SS: When last seen, added Inderal 40 mg twice a day, for bilateral hand  tremor.  Remains on gabapentin, Cymbalta, added Trileptal for neuropathy.  Sees pain management receiving oxycodone.  Never started the Trileptal or Inderal due to fear of side effect.  Overall remained stable.  Has bilateral hand tremor, difficulty with fine motor skills, including eating super cereal.  Has neuropathic pain from the mid thigh down, also in hands.  Medications working adequately, has discussed with pain management, Dr. Nelva Bush, switching to something other than oxycodone. We talked a lot of his heavy involvement in gymnastics.  Overall, condition appears stable.  Update November 15, 2020 SS: Going to pain clinic, now on Dilaudid. Tremor is same, in both hands, getting worse overtime. Mostly affects handwriting, artwork. Hands more involved with neuropathy, noted some numbness, tactile sensation isn't as firm. Getting around okay, more prone to falls when ground isn't level, his home is on a hill. He enjoys riding his bike, working in garden. Lives with his wife. Right and left hand are equal with tremor, difficulty with spiral draw, handwriting sample is mild to moderate tremor translation. Still has burning in the feet. Currently happy with regimen, this is "liveable"., not ready for medication change.   Update November 19, 2021 SS: Remains on Dilaudid 4 mg 3-4 times a day, it dulls the neuropathy pain. Also on Cymbalta 60 mg daily, gabapentin 800 mg 3 times daily. No significant falls. Neuropathy worsening, more in hands. Tremor > right hand. Borderline diabetic. In the last year, noted muscle atrophy of intrinsic muscles right hand. Progressing weakness above the wrists. Progression in upper extremities quicker than lower.   REVIEW OF SYSTEMS: Out of a complete 14 system review of symptoms, the patient complains only of the following symptoms, and all other reviewed systems are negative.  See HPI  ALLERGIES: Allergies  Allergen Reactions   Amoxicillin Nausea Only    Has patient had a PCN  reaction causing immediate rash, facial/tongue/throat swelling, SOB or lightheadedness with hypotension: No Has patient had a PCN reaction causing severe rash involving mucus membranes or skin necrosis: No Has patient had a PCN reaction that required hospitalization: No Has patient had a PCN reaction occurring within the last 10 years: Yes If all of the above answers are "NO", then may proceed with Cephalosporin use.   Macrobid  [Nitrofurantoin Macrocrystal]    Other     Muscle relaxant medication-unsure of the name    HOME MEDICATIONS: Outpatient Medications Prior to Visit  Medication Sig Dispense Refill   ACCU-CHEK GUIDE test strip      acyclovir (ZOVIRAX) 400 MG tablet Take 400 mg by mouth daily as needed (cold sores). Outbreaks      ALPRAZolam (XANAX) 0.25 MG tablet Take 0.25 mg by mouth daily as needed for anxiety.      Azelastine HCl 0.15 % SOLN Place 1 spray into both nostrils at bedtime.   3   Azelastine HCl 137 MCG/SPRAY SOLN Place into both nostrils.     Cholecalciferol (VITAMIN D) 2000 units CAPS Take 2,000 Units by mouth daily.     HYDROmorphone (DILAUDID) 4 MG tablet hydromorphone 4 mg tablet  TAKE 1 TABLET BY MOUTH 4 TIMES A DAY AS NEEDED     loratadine (CLARITIN) 10 MG tablet 1  tablet     naloxone (NARCAN) nasal spray 4 mg/0.1 mL Narcan 4 mg/actuation nasal spray     omeprazole (PRILOSEC) 40 MG capsule Take 1 capsule (40 mg total) by mouth 2 (two) times daily before a meal. (Patient taking differently: Take 20 mg by mouth daily.) 60 capsule 1   prochlorperazine (COMPAZINE) 5 MG tablet Take 2 tablets (10 mg total) by mouth every 6 (six) hours as needed for nausea or vomiting. 15 tablet 1   promethazine (PHENERGAN) 12.5 MG suppository Place 1 suppository (12.5 mg total) rectally every 6 (six) hours as needed for nausea, vomiting or refractory nausea / vomiting. 12 each 0   rosuvastatin (CRESTOR) 5 MG tablet rosuvastatin 5 mg tablet     thiamine (VITAMIN B-1) 100 MG tablet Take  100 mg by mouth daily.     trimethoprim (TRIMPEX) 100 MG tablet Take 100 mg by mouth daily.  11   zolpidem (AMBIEN) 10 MG tablet Take 2.5 mg by mouth at bedtime as needed for sleep.      clopidogrel (PLAVIX) 75 MG tablet TAKE 1 TABLET BY MOUTH EVERY DAY 90 tablet 3   DULoxetine (CYMBALTA) 60 MG capsule Take 1 capsule (60 mg total) by mouth daily. 90 capsule 4   gabapentin (NEURONTIN) 800 MG tablet Take 1 tablet (800 mg total) by mouth 3 (three) times daily. 270 tablet 4   No facility-administered medications prior to visit.    PAST MEDICAL HISTORY: Past Medical History:  Diagnosis Date   BPH (benign prostatic hyperplasia)    self caths 1-2 times daily.   Neuromuscular disorder ([place])    idiopathic,both feet,progressing to legs   Perforated ulcer ([place]) 1982   Stroke [facility])    states the Plavix is preventative due to the strong family history of stroke   Superficial basal cell carcinoma (BCC) 03/25/2021   Right Anterior Neck    PAST SURGICAL HISTORY: Past Surgical History:  Procedure Laterality Date   CHOLECYSTECTOMY N/A 04/07/2016   Procedure: LAPAROSCOPIC CHOLECYSTECTOMY;  Surgeon: Mickeal Skinner, MD;  Location: [facility];  Service: General;  Laterality: N/A;   ESOPHAGOGASTRODUODENOSCOPY N/A 03/05/2016   Procedure: ESOPHAGOGASTRODUODENOSCOPY (EGD);  Surgeon: Laurence Spates, MD;  Location: [facility];  Service: Endoscopy;  Laterality: N/A;   HERNIA REPAIR  2012   TONSILLECTOMY     TRANSURETHRAL RESECTION OF PROSTATE  2006    FAMILY HISTORY: Family History  Problem Relation Age of Onset   Stroke Mother    Diabetes Mother    Stroke Father    Diabetes Father    Diabetes Sister    Diabetes Brother     SOCIAL HISTORY: Social History   Socioeconomic History   Marital status: Married    Spouse name: Vinnie Level   Number of children: 2   Years of education: college   Highest education level: Not on file  Occupational History    Comment: retired   Tobacco Use   Smoking status: Former   Smokeless tobacco: Never  Substance and Sexual Activity   Alcohol use: Yes    Alcohol/week: 1.0 standard drink of alcohol    Types: 1 Glasses of wine per week    Comment: daily   Drug use: No   Sexual activity: Not on file  Other Topics Concern   Not on file  Social History Narrative   Patient is retired, Married for 26 years Vinnie Level).   EducationAdministrator, sports.   Right handed.   Caffeine- five cups daily only tea  Social Determinants of Health   Financial Resource Strain: Not on file  Food Insecurity: Not on file  Transportation Needs: Not on file  Physical Activity: Not on file  Stress: Not on file  Social Connections: Not on file  Intimate Partner Violence: Not on file   PHYSICAL EXAM  Vitals:   11/19/21 1044  BP: (!) 156/79  Pulse: 69  Weight: 207 lb 8 oz (94.1 kg)  Height: '5\' 9"'$  (1.753 m)   Body mass index is 30.64 kg/m.  Generalized: Well developed, in no acute distress  Neurological examination  Mentation: Alert oriented to time, place, history taking. Follows all commands speech and language fluent Cranial nerve II-XII: Pupils were equal round reactive to light. Extraocular movements were full, visual field were full on confrontational test. Facial sensation and strength were normal. Head turning and shoulder shrug  were normal and symmetric. Motor: Motor strength is intact, bilateral postural hand tremor, right > left.  Significant tremor translated to spiral drawl and handwriting sample.  Atrophy of right hand  medial and lateral posterior surface.  Sensory: Dependent decreased light touch to mid shin, mid forearm Coordination: Cerebellar testing reveals good finger-nose-finger and heel-to-shin bilaterally.  Moderate tremor bilaterally with finger-nose-finger Gait and station: Slightly wide-based, somewhat cautious, tandem gait is unsteady Reflexes: Deep tendon reflexes are depressed throughout  DIAGNOSTIC DATA  (LABS, IMAGING, TESTING) - I reviewed patient records, labs, notes, testing and imaging myself where available.  Lab Results  Component Value Date   WBC 16.0 (H) 01/25/2018   HGB 17.6 (H) 01/25/2018   HCT 51.4 01/25/2018   MCV 92.9 01/25/2018   PLT 338 01/25/2018      Component Value Date/Time   NA 139 01/25/2018 2227   NA 139 03/05/2015 1144   K 4.6 01/25/2018 2227   CL 95 (L) 01/25/2018 2227   CO2 27 01/25/2018 2227   GLUCOSE 210 (H) 01/25/2018 2227   BUN 24 (H) 01/25/2018 2227   BUN 9 03/05/2015 1144   CREATININE 1.35 (H) 01/25/2018 2227   CALCIUM 10.1 01/25/2018 2227   PROT 9.0 (H) 01/25/2018 2227   PROT 6.8 03/05/2015 1144   ALBUMIN 4.5 01/25/2018 2227   ALBUMIN 4.1 03/05/2015 1144   AST 35 01/25/2018 2227   ALT 35 01/25/2018 2227   ALKPHOS 85 01/25/2018 2227   BILITOT 1.6 (H) 01/25/2018 2227   BILITOT 0.5 03/05/2015 1144   GFRNONAA 50 (L) 01/25/2018 2227   GFRAA 58 (L) 01/25/2018 2227   No results found for: "CHOL", "HDL", "LDLCALC", "LDLDIRECT", "TRIG", "CHOLHDL" Lab Results  Component Value Date   HGBA1C 6.1 (H) 03/05/2015   Lab Results  Component Value Date   VITAMINB12 507 03/05/2015   Lab Results  Component Value Date   TSH 0.957 02/28/2016   ASSESSMENT AND PLAN 81 y.o. year old male  has a past medical history of BPH (benign prostatic hyperplasia), Neuromuscular disorder ([place]), Perforated ulcer ([place]) (1982), Stroke ([place]), and Superficial basal cell carcinoma ([place]) (03/25/2021). here with:  1.  Peripheral neuropathy -Atrophy to right hand intrinsic muscles, progression of upper extremity weakness/paresthesia, order NCV/EMG with Dr. Krista Blue -EMG nerve conduction study in 2017 showed mild axonal peripheral neuropathy, right median neuropathy, moderate right CTS (had carpal tunnel release) -Continue gabapentin 800 mg 3 times a day -Continue Cymbalta 60 mg daily -Also on Dilaudid from pain clinic  2.  Slow worsening bilateral hand tremor -Bilateral  postural tremor -Try propanolol 20 mg twice a day  3.  History of stroke -Continue  Plavix 75 mg daily, for secondary stroke prevention  Butler Denmark, AGNP-C, DNP 11/19/2021, 11:19 AM [facility] [address], [place], [place] [address] [phone]

## 2021-11-19 NOTE — Patient Instructions (Signed)
Add on propranolol 20 mg twice daily for tremor, monitor heart rate, keep > 60 Keep Cymbalta and gabapentin  I will let you know about NCV/EMG with Dr. Krista Blue  See you back in 6 months

## 2021-11-25 DIAGNOSIS — Z79891 Long term (current) use of opiate analgesic: Secondary | ICD-10-CM | POA: Diagnosis not present

## 2021-11-25 DIAGNOSIS — M5136 Other intervertebral disc degeneration, lumbar region: Secondary | ICD-10-CM | POA: Diagnosis not present

## 2021-11-25 DIAGNOSIS — G894 Chronic pain syndrome: Secondary | ICD-10-CM | POA: Diagnosis not present

## 2021-11-25 DIAGNOSIS — M961 Postlaminectomy syndrome, not elsewhere classified: Secondary | ICD-10-CM | POA: Diagnosis not present

## 2021-12-11 ENCOUNTER — Ambulatory Visit: Payer: Medicare PPO | Admitting: Dermatology

## 2022-01-29 ENCOUNTER — Ambulatory Visit: Payer: Medicare PPO | Admitting: Neurology

## 2022-01-29 ENCOUNTER — Encounter: Payer: Self-pay | Admitting: Neurology

## 2022-01-29 ENCOUNTER — Ambulatory Visit (INDEPENDENT_AMBULATORY_CARE_PROVIDER_SITE_OTHER): Payer: Medicare PPO | Admitting: Neurology

## 2022-01-29 VITALS — BP 131/69 | HR 58 | Ht 69.0 in | Wt 210.0 lb

## 2022-01-29 DIAGNOSIS — G609 Hereditary and idiopathic neuropathy, unspecified: Secondary | ICD-10-CM | POA: Diagnosis not present

## 2022-01-29 DIAGNOSIS — R269 Unspecified abnormalities of gait and mobility: Secondary | ICD-10-CM

## 2022-01-29 DIAGNOSIS — R2689 Other abnormalities of gait and mobility: Secondary | ICD-10-CM

## 2022-01-29 NOTE — Progress Notes (Addendum)
ASSESSMENT AND PLAN  David Li is a 81 y.o. male   Progressive disability with neuropathy, progressive worsening neuropathic pain  EMG nerve conduction study significant worsening compared to previous study in 2017, definite axonal loss, could not rule out demyelinating component, especially with his elevated CSF total protein of 63 in the past,  I have discussed with him possibility of IVIG treatment, he wants to hold off the process, He is to continue current chronic neuropathic polypharmacy   DIAGNOSTIC DATA (LABS, IMAGING, TESTING) - I reviewed patient records, labs, notes, testing and imaging myself where available. Laboratory evaluations from May 05, 2022, normal CBC hemoglobin 14.7, normal CMP creatinine of 0.59, A1c 6.6, TSH was elevated 6.57, LDL was 125 MEDICAL HISTORY: Mr. Hilliker was followed by Dr. Doy Mince since April 2006, he presented with sensory changes starting at the sole of his feet, gradually ascending to bilateral lower extremity, fairly symmetric.  EMG nerve conduction study and spine imaging were negative initially,  In August 2006 he was hospitalized with fever and neuralgia, temporal artery biopsy demonstrated vasculitis, he was treated with methotrexate and prednisone with good response.  He is no longer on immunosuppressive treatment  His neuropathic symptoms continue to progress, require higher dose of gabapentin, polypharmacy of nortriptyline,  Over the years, his lower extremity paresthesia, numbness tingling neuropathic pain continue to progress, he enjoys work with rescue dogs, used to be very physically active, is a retired Psychologist, sport and exercise, now has difficulty with his balance  EMG nerve conduction study in January 2017 showed mild length-dependent peripheral neuropathy, no significant demyelinating features  Around 2017, he also noticed bilateral fingertips numbness tightness sensation, spinal fluid at that time showed total protein 63 with  normal glucose and cells,  Over the years he is on titrating dose of medication for his lower extremity neuropathic pain, began to see pain management Dr. Leda Gauze since 2021, was taking oxycodone, was changed to Dilaudid since 2022,  He also developed gradual onset slow worsening bilateral hands tremor, began to receive low dose beta-blocker, seems to help his symptoms  He is now on polypharmacy treatment, Cymbalta 60 mg daily, gabapentin 800 mg 3 times a day, Dilaudid 4 mg 4 times a day, also take Ambien low-dose for difficulty sleeping at nighttime  Extensive laboratory evaluation around 2016 showed normal negative RPR, C-reactive protein, thyroid functional test, B12, ANA, protein electrophoresis, Lyme titer, hepatitis, CBC, CMP, A1c was 6.1,  Spinal fluid testing November 2016, WBC 1, total protein of 63  PHYSICAL EXAM: Blood pressure 131/69 heart rate of 58  PHYSICAL EXAMNIATION:  Gen: NAD, conversant, well nourised, well groomed                     Cardiovascular: Regular rate rhythm, no peripheral edema, warm, nontender. Eyes: Conjunctivae clear without exudates or hemorrhage Neck: Supple, no carotid bruits. Pulmonary: Clear to auscultation bilaterally   NEUROLOGICAL EXAM:  MENTAL STATUS: Speech/cognition: Awake, alert, oriented to history taking and casual conversation CRANIAL NERVES: CN II: Visual fields are full to confrontation. Pupils are round equal and briskly reactive to light. CN III, IV, VI: extraocular movement are normal. No ptosis. CN V: Facial sensation is intact to light touch CN VII: Face is symmetric with normal eye closure  CN VIII: Hearing is normal to causal conversation. CN IX, X: Phonation is normal. CN XI: Head turning and shoulder shrug are intact  MOTOR: Moderate bilateral hands postural tremor, intrinsic hand muscle atrophy, mild to moderate finger abduction  weakness,  REFLEXES: Reflexes were hypoactive and symmetric  SENSORY: Intact to light  touch, pinprick and vibratory sensation are intact in fingers and toes.  COORDINATION: There is no trunk or limb dysmetria noted.  GAIT/STANCE: Need push-up to get up from seated position, cautious, mildly unsteady  REVIEW OF SYSTEMS:  Full 14 system review of systems performed and notable only for as above All other review of systems were negative.   ALLERGIES: Allergies  Allergen Reactions   Amoxicillin Nausea Only    Has patient had a PCN reaction causing immediate rash, facial/tongue/throat swelling, SOB or lightheadedness with hypotension: No Has patient had a PCN reaction causing severe rash involving mucus membranes or skin necrosis: No Has patient had a PCN reaction that required hospitalization: No Has patient had a PCN reaction occurring within the last 10 years: Yes If all of the above answers are "NO", then may proceed with Cephalosporin use.   Macrobid  [Nitrofurantoin Macrocrystal]    Other     Muscle relaxant medication-unsure of the name    HOME MEDICATIONS: Current Outpatient Medications  Medication Sig Dispense Refill   ACCU-CHEK GUIDE test strip      acyclovir (ZOVIRAX) 400 MG tablet Take 400 mg by mouth daily as needed (cold sores). Outbreaks      ALPRAZolam (XANAX) 0.25 MG tablet Take 0.25 mg by mouth daily as needed for anxiety.      Azelastine HCl 0.15 % SOLN Place 1 spray into both nostrils at bedtime.   3   Azelastine HCl 137 MCG/SPRAY SOLN Place into both nostrils.     Cholecalciferol (VITAMIN D) 2000 units CAPS Take 2,000 Units by mouth daily.     clopidogrel (PLAVIX) 75 MG tablet Take 1 tablet (75 mg total) by mouth daily. 90 tablet 3   DULoxetine (CYMBALTA) 60 MG capsule Take 1 capsule (60 mg total) by mouth daily. 90 capsule 4   gabapentin (NEURONTIN) 800 MG tablet Take 1 tablet (800 mg total) by mouth 3 (three) times daily. 270 tablet 4   HYDROmorphone (DILAUDID) 4 MG tablet hydromorphone 4 mg tablet  TAKE 1 TABLET BY MOUTH 4 TIMES A DAY AS NEEDED      loratadine (CLARITIN) 10 MG tablet 1 tablet     naloxone (NARCAN) nasal spray 4 mg/0.1 mL Narcan 4 mg/actuation nasal spray     omeprazole (PRILOSEC) 40 MG capsule Take 1 capsule (40 mg total) by mouth 2 (two) times daily before a meal. (Patient taking differently: Take 20 mg by mouth daily.) 60 capsule 1   prochlorperazine (COMPAZINE) 5 MG tablet Take 2 tablets (10 mg total) by mouth every 6 (six) hours as needed for nausea or vomiting. 15 tablet 1   promethazine (PHENERGAN) 12.5 MG suppository Place 1 suppository (12.5 mg total) rectally every 6 (six) hours as needed for nausea, vomiting or refractory nausea / vomiting. 12 each 0   propranolol (INDERAL) 20 MG tablet Take 1 tablet (20 mg total) by mouth 2 (two) times daily. 60 tablet 6   rosuvastatin (CRESTOR) 5 MG tablet rosuvastatin 5 mg tablet     thiamine (VITAMIN B-1) 100 MG tablet Take 100 mg by mouth daily.     trimethoprim (TRIMPEX) 100 MG tablet Take 100 mg by mouth daily.  11   zolpidem (AMBIEN) 10 MG tablet Take 2.5 mg by mouth at bedtime as needed for sleep.      No current facility-administered medications for this visit.    PAST MEDICAL HISTORY: Past Medical History:  Diagnosis Date   BPH (benign prostatic hyperplasia)    self caths 1-2 times daily.   Neuromuscular disorder ([place])    idiopathic,both feet,progressing to legs   Perforated ulcer ([place]) 1982   Stroke [facility])    states the Plavix is preventative due to the strong family history of stroke   Superficial basal cell carcinoma (BCC) 03/25/2021   Right Anterior Neck    PAST SURGICAL HISTORY: Past Surgical History:  Procedure Laterality Date   CHOLECYSTECTOMY N/A 04/07/2016   Procedure: LAPAROSCOPIC CHOLECYSTECTOMY;  Surgeon: Mickeal Skinner, MD;  Location: [facility];  Service: General;  Laterality: N/A;   ESOPHAGOGASTRODUODENOSCOPY N/A 03/05/2016   Procedure: ESOPHAGOGASTRODUODENOSCOPY (EGD);  Surgeon: Laurence Spates, MD;  Location: [facility];  Service: Endoscopy;  Laterality: N/A;   HERNIA REPAIR  2012   TONSILLECTOMY     TRANSURETHRAL RESECTION OF PROSTATE  2006    FAMILY HISTORY: Family History  Problem Relation Age of Onset   Stroke Mother    Diabetes Mother    Stroke Father    Diabetes Father    Diabetes Sister    Diabetes Brother     SOCIAL HISTORY: Social History   Socioeconomic History   Marital status: Married    Spouse name: Vinnie Level   Number of children: 2   Years of education: college   Highest education level: Not on file  Occupational History    Comment: retired  Tobacco Use   Smoking status: Former   Smokeless tobacco: Never  Substance and Sexual Activity   Alcohol use: Yes    Alcohol/week: 1.0 standard drink of alcohol    Types: 1 Glasses of wine per week    Comment: daily   Drug use: No   Sexual activity: Not on file  Other Topics Concern   Not on file  Social History Narrative   Patient is retired, Married for 26 years Vinnie Level).   EducationAdministrator, sports.   Right handed.   Caffeine- five cups daily only tea         Social Determinants of Health   Financial Resource Strain: Not on file  Food Insecurity: Not on file  Transportation Needs: Not on file  Physical Activity: Not on file  Stress: Not on file  Social Connections: Not on file  Intimate Partner Violence: Not on file      Marcial Pacas, M.D. Ph.D.  [facility] [address] [place], [place] [address] Ph: [phone] Fax: [phone]  CC:  Jonathon Jordan, MD [address] [place],  [place] [address]  Jonathon Jordan, MD

## 2022-02-01 ENCOUNTER — Telehealth: Payer: Self-pay | Admitting: Neurology

## 2022-02-01 DIAGNOSIS — R269 Unspecified abnormalities of gait and mobility: Secondary | ICD-10-CM | POA: Insufficient documentation

## 2022-02-01 NOTE — Telephone Encounter (Signed)
Please reschedule patient to my follow up in 6 months, cancel his appt with Saran in 2024

## 2022-02-01 NOTE — Procedures (Signed)
Full Name: David Li Gender: Male MRN #: [id number] Date of Birth: 11-15-1940    Visit Date: 01/29/2022 11:10 Age: 81 Years Examining Physician: Marcial Pacas Referring Physician: Marcial Pacas  Height: 5 feet 10 inch History: 81 year old male with progressive bilateral lower extremity ascending paresthesia, neuropathic pain, mildly unsteady gait  Summary of the test: Nerve conduction study: Right sural, superficial peroneal, ulnar sensory responses were absent.  Right median sensory response showed significantly prolonged peak latency with mildly decreased snap amplitude.  Right radial sensory response was within normal limit.  Right peroneal to EDB motor responses were absent.  Right ulnar motor responses were absent. Right tibial motor responses showed significantly prolonged distal latency with significantly decreased CMAP amplitude, conduction velocity. Right median motor responses showed significantly prolonged distal latency, mildly decreased CMAP amplitude, conduction velocity.  Right ulnar motor responses were absent.  Electromyography: Selected needle examination of right lower extremity muscles, lumbosacral paraspinal muscles, upper extremity muscles and cervical paraspinal muscles were performed.  There was evidence of active and chronic neuropathic changes involving right distal leg muscles, right distal upper extremity muscles.  There is also evidence of chronic neuropathic changes involving right proximal upper extremity muscles.  Needle examinations of right lumbar and cervical paraspinal muscles showed no significant abnormality.   Conclusion: This is an abnormal study, there is electrodiagnostic evidence of severe peripheral neuropathy, with definite axonal features, I could not rule out a possibility of demyelinating component, due to apparent proximal upper and lower extremity muscle involvement.  But multiple motor nerve conduction studies showed decreased  CMAP amplitude, it was difficult to elicit or evaluate late response.    ------------------------------- Marcial Pacas, M.D. Ph.D.  [facility] [address], [place], [place] [address] Tel: [phone] Fax: [phone]  Verbal informed consent was obtained from the patient, patient was informed of potential risk of procedure, including bruising, bleeding, hematoma formation, infection, muscle weakness, muscle pain, numbness, among others.        [place]    Nerve / Sites Muscle Latency Ref. Amplitude Ref. Rel Amp Segments Distance Velocity Ref. Area    ms ms mV mV %  cm m/s m/s mVms  R Median - APB     Wrist APB 6.4 ?4.4 3.6 ?4.0 100 Wrist - APB 7   12.4     Upper arm APB 11.7  3.6  100 Upper arm - Wrist 22 42 ?49 12.6  R Ulnar - ADM     Wrist ADM NR ?3.3 NR ?6.0 NR Wrist - ADM 7   NR     B.Elbow ADM      B.Elbow - Wrist 16   0.6     A.Elbow ADM     178 A.Elbow - B.Elbow 20   2.5  R Peroneal - EDB     Ankle EDB NR ?6.5 NR ?2.0 NR Ankle - EDB 9   NR         Pop fossa - Ankle      R Tibial - AH     Ankle AH 8.1 ?5.8 0.4 ?4.0 100 Ankle - AH 9   1.4     Pop fossa AH 22.5  0.4  84.6 Pop fossa - Ankle 51 36 ?41 0.3             SNC    Nerve / Sites Rec. Site Peak Lat Ref.  Amp Ref. Segments Distance    ms ms V V  cm  R Radial - Anatomical snuff box (Forearm)     Forearm Wrist 2.5 ?2.9 12 ?15 Forearm - Wrist 10  R Sural - Ankle (Calf)     Calf Ankle NR ?4.4 NR ?6 Calf - Ankle 14  R Superficial peroneal - Ankle     Lat leg Ankle NR ?4.4 NR ?6 Lat leg - Ankle 14  R Median - Orthodromic (Dig II, Mid palm)     Dig II Wrist 4.6 ?3.4 6 ?10 Dig II - Wrist 13  R Ulnar - Orthodromic, (Dig V, Mid palm)     Dig V Wrist NR ?3.1 NR ?5 Dig V - Wrist 69               F  Wave    Nerve F Lat Ref.   ms ms  R Tibial - AH NR ?56.0       EMG Summary Table    Spontaneous MUAP Recruitment  Muscle IA Fib PSW Fasc Other Amp Dur. Poly Pattern  R. Tibialis anterior Normal  None None None _______ Normal Normal Normal Reduced  R. Tibialis posterior Normal None None None _______ Normal Normal Normal Reduced  R. Peroneus longus Normal None None None _______ Normal Normal Normal Reduced  R. Gastrocnemius (Medial head) Normal None None None _______ Normal Normal Normal Reduced  R. Vastus lateralis Normal None None None _______ Normal Normal Normal Reduced  R. Lumbar paraspinals (low) Normal None None None _______ Normal Normal Normal Normal  R. Lumbar paraspinals (mid) Normal None None None _______ Normal Normal Normal Normal  R. First dorsal interosseous Increased 1+ None None _______ Increased Increased 1+ Reduced  R. Pronator teres Normal None None None _______ Normal Normal Normal Reduced  R. Biceps brachii Normal None None None _______ Normal Normal Normal Reduced  R. Deltoid Normal None None None _______ Normal Normal Normal Reduced  R. Triceps brachii Normal None None None _______ Normal Normal Normal Reduced  R. Cervical paraspinals Normal None None None _______ Normal Normal Normal Normal

## 2022-02-01 NOTE — Progress Notes (Signed)
EMG is under procedure

## 2022-03-31 DIAGNOSIS — G609 Hereditary and idiopathic neuropathy, unspecified: Secondary | ICD-10-CM | POA: Diagnosis not present

## 2022-03-31 DIAGNOSIS — G894 Chronic pain syndrome: Secondary | ICD-10-CM | POA: Diagnosis not present

## 2022-03-31 DIAGNOSIS — M5136 Other intervertebral disc degeneration, lumbar region: Secondary | ICD-10-CM | POA: Diagnosis not present

## 2022-03-31 DIAGNOSIS — M961 Postlaminectomy syndrome, not elsewhere classified: Secondary | ICD-10-CM | POA: Diagnosis not present

## 2022-03-31 DIAGNOSIS — Z5181 Encounter for therapeutic drug level monitoring: Secondary | ICD-10-CM | POA: Diagnosis not present

## 2022-03-31 DIAGNOSIS — Z79891 Long term (current) use of opiate analgesic: Secondary | ICD-10-CM | POA: Diagnosis not present

## 2022-04-02 DIAGNOSIS — Z79899 Other long term (current) drug therapy: Secondary | ICD-10-CM | POA: Diagnosis not present

## 2022-04-02 DIAGNOSIS — Z5181 Encounter for therapeutic drug level monitoring: Secondary | ICD-10-CM | POA: Diagnosis not present

## 2022-04-24 DIAGNOSIS — N3020 Other chronic cystitis without hematuria: Secondary | ICD-10-CM | POA: Diagnosis not present

## 2022-04-24 DIAGNOSIS — R338 Other retention of urine: Secondary | ICD-10-CM | POA: Diagnosis not present

## 2022-05-01 DIAGNOSIS — Z0000 Encounter for general adult medical examination without abnormal findings: Secondary | ICD-10-CM | POA: Diagnosis not present

## 2022-05-01 DIAGNOSIS — I10 Essential (primary) hypertension: Secondary | ICD-10-CM | POA: Diagnosis not present

## 2022-05-01 DIAGNOSIS — E038 Other specified hypothyroidism: Secondary | ICD-10-CM | POA: Diagnosis not present

## 2022-05-01 DIAGNOSIS — G4700 Insomnia, unspecified: Secondary | ICD-10-CM | POA: Diagnosis not present

## 2022-05-01 DIAGNOSIS — E7800 Pure hypercholesterolemia, unspecified: Secondary | ICD-10-CM | POA: Diagnosis not present

## 2022-05-01 DIAGNOSIS — R251 Tremor, unspecified: Secondary | ICD-10-CM | POA: Diagnosis not present

## 2022-05-01 DIAGNOSIS — G629 Polyneuropathy, unspecified: Secondary | ICD-10-CM | POA: Diagnosis not present

## 2022-05-01 DIAGNOSIS — E1169 Type 2 diabetes mellitus with other specified complication: Secondary | ICD-10-CM | POA: Diagnosis not present

## 2022-05-01 DIAGNOSIS — Z1211 Encounter for screening for malignant neoplasm of colon: Secondary | ICD-10-CM | POA: Diagnosis not present

## 2022-05-01 DIAGNOSIS — Z79899 Other long term (current) drug therapy: Secondary | ICD-10-CM | POA: Diagnosis not present

## 2022-05-07 DIAGNOSIS — E1169 Type 2 diabetes mellitus with other specified complication: Secondary | ICD-10-CM | POA: Diagnosis not present

## 2022-05-15 ENCOUNTER — Other Ambulatory Visit: Payer: Self-pay | Admitting: Neurology

## 2022-05-15 NOTE — Telephone Encounter (Signed)
Rx sent 

## 2022-05-22 ENCOUNTER — Encounter: Payer: Self-pay | Admitting: Neurology

## 2022-05-22 ENCOUNTER — Ambulatory Visit: Payer: Medicare PPO | Admitting: Neurology

## 2022-05-22 VITALS — BP 149/79 | HR 59 | Ht 70.0 in | Wt 222.4 lb

## 2022-05-22 DIAGNOSIS — M792 Neuralgia and neuritis, unspecified: Secondary | ICD-10-CM | POA: Diagnosis not present

## 2022-05-22 DIAGNOSIS — G6289 Other specified polyneuropathies: Secondary | ICD-10-CM | POA: Diagnosis not present

## 2022-05-22 DIAGNOSIS — R269 Unspecified abnormalities of gait and mobility: Secondary | ICD-10-CM | POA: Diagnosis not present

## 2022-05-22 NOTE — Progress Notes (Signed)
ASSESSMENT AND PLAN  David Li is a 82 y.o. male   Progressive disability with neuropathy, progressive worsening neuropathic pain  Repeat EMG nerve conduction study in September 2023 showed significant worsening compared to previous study in 2017, with mixed demyelinating and axonal features,  Previous CSF showed elevated total protein of 63    I have discussed with him possibility of IVIG treatment, he decided to proceed, start preauthorization, 2 g/kg, followed by 1 g/kg every 3 weeks for 6 treatment,  Neuropathic pain, polypharmacy treatment  Continue Cymbalta, gabapentin, also receiving Dilaudid follow-up pain management    Return To Clinic W In 6 Months    DIAGNOSTIC DATA (LABS, IMAGING, TESTING) - I reviewed patient records, labs, notes, testing and imaging myself where available. Laboratory evaluations from May 05, 2022, normal CBC hemoglobin 14.7, normal CMP creatinine of 0.59, A1c 6.6, TSH was elevated 6.57, LDL was 125  MEDICAL HISTORY: David Li was followed by Dr. Doy Mince since April 2006, he presented with sensory changes starting at the sole of his feet, gradually ascending to bilateral lower extremity, fairly symmetric.  EMG nerve conduction study and spine imaging were negative initially,  In August 2006 he was hospitalized with fever and neuralgia, temporal artery biopsy demonstrated vasculitis, he was treated with methotrexate and prednisone with good response.  He is no longer on immunosuppressive treatment  His neuropathic symptoms continue to progress, require higher dose of gabapentin, polypharmacy of nortriptyline,  Over the years, his lower extremity paresthesia, numbness tingling neuropathic pain continue to progress, he enjoys work with rescue dogs, used to be very physically active, is a retired Psychologist, sport and exercise, now has difficulty with his balance  EMG nerve conduction study in January 2017 showed mild length-dependent peripheral neuropathy,  no significant demyelinating features  Around 2017, he also noticed bilateral fingertips numbness tightness sensation, spinal fluid at that time showed total protein 63 with normal glucose and cells,  Over the years he is on titrating dose of medication for his lower extremity neuropathic pain, began to see pain management Dr. Nelva Bush since 2021, was taking oxycodone, was changed to Dilaudid since 2022,  He also developed gradual onset slow worsening bilateral hands tremor, began to receive low dose beta-blocker, seems to help his symptoms  He is now on polypharmacy treatment, Cymbalta 60 mg daily, gabapentin 800 mg 3 times a day, Dilaudid 4 mg 4 times a day, also take Ambien low-dose for difficulty sleeping at nighttime  Extensive laboratory evaluation around 2016 showed normal negative RPR, C-reactive protein, thyroid functional test, B12, ANA, protein electrophoresis, Lyme titer, hepatitis, CBC, CMP, A1c was 6.1,  Spinal fluid testing November 2016, WBC 1, total protein of 63  UPDATE May 22 2022: He is under pain management taking Dilaudid '4mg'$  x4 times a day, gabapentin '800mg'$  tid, Cymbalta '60mg'$  qday.  Despite that, he continues to have moderate neuropathic pain, mainly involving bottom of his feet, lower extremity, 3 out of 10, also complains of worsening bilateral hand numbness, tingling, loss of dexterity  He also noticed worsening gait abnormalities, fell few times, remain active,  EMG nerve conduction study September 2023 showed findings consistent with a demyelinating and axonal neuropathy, noticeable decreased recruitment at proximal upper and lower extremity needle examination, profound axonal loss  Laboratory evaluation today, discussed with patient agreed to proceed with IVIG trial  PHYSICAL EXAM: Vitals:   05/22/22 1255  Weight: 222 lb 6.4 oz (100.9 kg)  Height: '5\' 10"'$  (1.778 m)    Gen: NAD, conversant, well  nourised, well groomed                     Cardiovascular: Regular  rate rhythm, no peripheral edema, warm, nontender. Eyes: Conjunctivae clear without exudates or hemorrhage Neck: Supple, no carotid bruits. Pulmonary: Clear to auscultation bilaterally   NEUROLOGICAL EXAM:  MENTAL STATUS: Speech/cognition: Awake, alert, oriented to history taking and casual conversation CRANIAL NERVES: CN II: Visual fields are full to confrontation. Pupils are round equal and briskly reactive to light. CN III, IV, VI: extraocular movement are normal. No ptosis. CN V: Facial sensation is intact to light touch CN VII: Face is symmetric with normal eye closure  CN VIII: Hearing is normal to causal conversation. CN IX, X: Phonation is normal. CN XI: Head turning and shoulder shrug are intact  MOTOR: Moderate bilateral hands postural tremor, intrinsic hand muscle atrophy, mild to moderate finger abduction weakness, grip, finger extension flexion weakness, moderate bilateral toe flexion extension weakness  REFLEXES: Areflexia  SENSORY: Absent proprioception at toes, decreased pinprick and vibratory sensation to knee level,   decreased vibratory sensation at fingertips, decreased pinprick to waist level  COORDINATION: There is no trunk or limb dysmetria noted.  GAIT/STANCE: Need push-up to get up from seated position, cautious, mildly unsteady difficulty standing up on tiptoes, heels, positive Romberg signs,  REVIEW OF SYSTEMS:  Full 14 system review of systems performed and notable only for as above All other review of systems were negative.   ALLERGIES: Allergies  Allergen Reactions   Amoxicillin Nausea Only    Has patient had a PCN reaction causing immediate rash, facial/tongue/throat swelling, SOB or lightheadedness with hypotension: No Has patient had a PCN reaction causing severe rash involving mucus membranes or skin necrosis: No Has patient had a PCN reaction that required hospitalization: No Has patient had a PCN reaction occurring within the last 10  years: Yes If all of the above answers are "NO", then may proceed with Cephalosporin use.   Macrobid  [Nitrofurantoin Macrocrystal]    Other     Muscle relaxant medication-unsure of the name    HOME MEDICATIONS: Current Outpatient Medications  Medication Sig Dispense Refill   ACCU-CHEK GUIDE test strip      acyclovir (ZOVIRAX) 400 MG tablet Take 400 mg by mouth daily as needed (cold sores). Outbreaks      ALPRAZolam (XANAX) 0.25 MG tablet Take 0.25 mg by mouth daily as needed for anxiety.      Azelastine HCl 0.15 % SOLN Place 1 spray into both nostrils at bedtime.   3   Azelastine HCl 137 MCG/SPRAY SOLN Place into both nostrils.     Cholecalciferol (VITAMIN D) 2000 units CAPS Take 2,000 Units by mouth daily.     clopidogrel (PLAVIX) 75 MG tablet Take 1 tablet (75 mg total) by mouth daily. 90 tablet 3   DULoxetine (CYMBALTA) 60 MG capsule Take 1 capsule (60 mg total) by mouth daily. 90 capsule 4   gabapentin (NEURONTIN) 800 MG tablet Take 1 tablet (800 mg total) by mouth 3 (three) times daily. 270 tablet 4   HYDROmorphone (DILAUDID) 4 MG tablet hydromorphone 4 mg tablet  TAKE 1 TABLET BY MOUTH 4 TIMES A DAY AS NEEDED     loratadine (CLARITIN) 10 MG tablet 1 tablet     naloxone (NARCAN) nasal spray 4 mg/0.1 mL Narcan 4 mg/actuation nasal spray     prochlorperazine (COMPAZINE) 5 MG tablet Take 2 tablets (10 mg total) by mouth every 6 (six) hours as  needed for nausea or vomiting. 15 tablet 1   promethazine (PHENERGAN) 12.5 MG suppository Place 1 suppository (12.5 mg total) rectally every 6 (six) hours as needed for nausea, vomiting or refractory nausea / vomiting. 12 each 0   propranolol (INDERAL) 20 MG tablet TAKE 1 TABLET BY MOUTH TWICE A DAY 180 tablet 2   rosuvastatin (CRESTOR) 5 MG tablet rosuvastatin 5 mg tablet     thiamine (VITAMIN B-1) 100 MG tablet Take 100 mg by mouth daily.     trimethoprim (TRIMPEX) 100 MG tablet Take 100 mg by mouth daily.  11   omeprazole (PRILOSEC) 40 MG  capsule Take 1 capsule (40 mg total) by mouth 2 (two) times daily before a meal. (Patient not taking: Reported on 05/22/2022) 60 capsule 1   zolpidem (AMBIEN) 10 MG tablet Take 2.5 mg by mouth at bedtime as needed for sleep.  (Patient not taking: Reported on 05/22/2022)     No current facility-administered medications for this visit.    PAST MEDICAL HISTORY: Past Medical History:  Diagnosis Date   BPH (benign prostatic hyperplasia)    self caths 1-2 times daily.   Neuromuscular disorder ([place])    idiopathic,both feet,progressing to legs   Perforated ulcer ([place]) 1982   Stroke [facility])    states the Plavix is preventative due to the strong family history of stroke   Superficial basal cell carcinoma ([facility]) 03/25/2021   Right Anterior Neck    PAST SURGICAL HISTORY: Past Surgical History:  Procedure Laterality Date   CHOLECYSTECTOMY N/A 04/07/2016   Procedure: LAPAROSCOPIC CHOLECYSTECTOMY;  Surgeon: Mickeal Skinner, MD;  Location: [facility];  Service: General;  Laterality: N/A;   ESOPHAGOGASTRODUODENOSCOPY N/A 03/05/2016   Procedure: ESOPHAGOGASTRODUODENOSCOPY (EGD);  Surgeon: Laurence Spates, MD;  Location: [facility];  Service: Endoscopy;  Laterality: N/A;   HERNIA REPAIR  2012   TONSILLECTOMY     TRANSURETHRAL RESECTION OF PROSTATE  2006    FAMILY HISTORY: Family History  Problem Relation Age of Onset   Stroke Mother    Diabetes Mother    Stroke Father    Diabetes Father    Diabetes Sister    Diabetes Brother     SOCIAL HISTORY: Social History   Socioeconomic History   Marital status: Married    Spouse name: Vinnie Level   Number of children: 2   Years of education: college   Highest education level: Not on file  Occupational History    Comment: retired  Tobacco Use   Smoking status: Former   Smokeless tobacco: Never  Substance and Sexual Activity   Alcohol use: Yes    Alcohol/week: 1.0 standard drink of alcohol    Types: 1 Glasses of wine per week     Comment: daily   Drug use: No   Sexual activity: Not on file  Other Topics Concern   Not on file  Social History Narrative   Patient is retired, Married for 26 years Vinnie Level).   EducationAdministrator, sports.   Right handed.   Caffeine- five cups daily only tea         Social Determinants of Health   Financial Resource Strain: Not on file  Food Insecurity: Not on file  Transportation Needs: Not on file  Physical Activity: Not on file  Stress: Not on file  Social Connections: Not on file  Intimate Partner Violence: Not on file      Marcial Pacas, M.D. Ph.D.  [facility] [address] [place], [place] [address]  Ph: [phone] Fax: [phone]  CC:  Jonathon Jordan, MD [address] [place],   [address]  Jonathon Jordan, MD

## 2022-05-26 LAB — CBC WITH DIFFERENTIAL/PLATELET
Basophils Absolute: 0.1 10*3/uL (ref 0.0–0.2)
Basos: 1 %
EOS (ABSOLUTE): 0.2 10*3/uL (ref 0.0–0.4)
Eos: 3 %
Hematocrit: 42 % (ref 37.5–51.0)
Hemoglobin: 14.2 g/dL (ref 13.0–17.7)
Immature Grans (Abs): 0.1 10*3/uL (ref 0.0–0.1)
Immature Granulocytes: 1 %
Lymphocytes Absolute: 2.1 10*3/uL (ref 0.7–3.1)
Lymphs: 30 %
MCH: 31.3 pg (ref 26.6–33.0)
MCHC: 33.8 g/dL (ref 31.5–35.7)
MCV: 93 fL (ref 79–97)
Monocytes Absolute: 0.6 10*3/uL (ref 0.1–0.9)
Monocytes: 8 %
Neutrophils Absolute: 3.9 10*3/uL (ref 1.4–7.0)
Neutrophils: 57 %
Platelets: 232 10*3/uL (ref 150–450)
RBC: 4.53 x10E6/uL (ref 4.14–5.80)
RDW: 12.4 % (ref 11.6–15.4)
WBC: 6.9 10*3/uL (ref 3.4–10.8)

## 2022-05-26 LAB — COMPREHENSIVE METABOLIC PANEL
ALT: 26 IU/L (ref 0–44)
AST: 23 IU/L (ref 0–40)
Albumin/Globulin Ratio: 1.5 (ref 1.2–2.2)
Albumin: 4.1 g/dL (ref 3.8–4.8)
Alkaline Phosphatase: 87 IU/L (ref 44–121)
BUN/Creatinine Ratio: 13 (ref 10–24)
BUN: 13 mg/dL (ref 8–27)
Bilirubin Total: 0.7 mg/dL (ref 0.0–1.2)
CO2: 26 mmol/L (ref 20–29)
Calcium: 9.4 mg/dL (ref 8.6–10.2)
Chloride: 103 mmol/L (ref 96–106)
Creatinine, Ser: 0.98 mg/dL (ref 0.76–1.27)
Globulin, Total: 2.7 g/dL (ref 1.5–4.5)
Glucose: 117 mg/dL — ABNORMAL HIGH (ref 70–99)
Potassium: 5 mmol/L (ref 3.5–5.2)
Sodium: 139 mmol/L (ref 134–144)
Total Protein: 6.8 g/dL (ref 6.0–8.5)
eGFR: 78 mL/min/{1.73_m2} (ref >59)

## 2022-05-26 LAB — SEDIMENTATION RATE: Sed Rate: 18 mm/hr (ref 0–30)

## 2022-05-26 LAB — MULTIPLE MYELOMA PANEL, SERUM
Albumin SerPl Elph-Mcnc: 3.7 g/dL (ref 2.9–4.4)
Albumin/Glob SerPl: 1.2 (ref 0.7–1.7)
Alpha 1: 0.2 g/dL (ref 0.0–0.4)
Alpha2 Glob SerPl Elph-Mcnc: 0.7 g/dL (ref 0.4–1.0)
B-Globulin SerPl Elph-Mcnc: 1.1 g/dL (ref 0.7–1.3)
Gamma Glob SerPl Elph-Mcnc: 1.1 g/dL (ref 0.4–1.8)
Globulin, Total: 3.1 g/dL (ref 2.2–3.9)
IgA/Immunoglobulin A, Serum: 235 mg/dL (ref 61–437)
IgG (Immunoglobin G), Serum: 1087 mg/dL (ref 603–1613)
IgM (Immunoglobulin M), Srm: 61 mg/dL (ref 15–143)

## 2022-05-26 LAB — C-REACTIVE PROTEIN: CRP: 4 mg/L (ref 0–10)

## 2022-05-26 LAB — CK: Total CK: 50 U/L (ref 41–331)

## 2022-05-26 LAB — FOLATE: Folate: 9.8 ng/mL (ref >3.0)

## 2022-05-26 LAB — VITAMIN D 25 HYDROXY (VIT D DEFICIENCY, FRACTURES): Vit D, 25-Hydroxy: 19.6 ng/mL — ABNORMAL LOW (ref 30.0–100.0)

## 2022-05-26 LAB — TSH: TSH: 3.68 u[IU]/mL (ref 0.450–4.500)

## 2022-05-26 LAB — HIV ANTIBODY (ROUTINE TESTING W REFLEX): HIV Screen 4th Generation wRfx: NONREACTIVE

## 2022-05-26 LAB — HGB A1C W/O EAG: Hgb A1c MFr Bld: 6.8 % — ABNORMAL HIGH (ref 4.8–5.6)

## 2022-05-26 LAB — ANA W/REFLEX IF POSITIVE: Anti Nuclear Antibody (ANA): NEGATIVE

## 2022-05-26 LAB — RPR: RPR Ser Ql: NONREACTIVE

## 2022-05-26 LAB — VITAMIN B12: Vitamin B-12: 881 pg/mL (ref 232–1245)

## 2022-06-09 ENCOUNTER — Telehealth: Payer: Self-pay

## 2022-06-09 NOTE — Telephone Encounter (Signed)
[employer] received octagam denial. Denial information placed in MD's office for review and determine the best steps going forward.

## 2022-06-11 NOTE — Telephone Encounter (Signed)
Reviewed the feedback from Medicare part B, IVIG was denied for the diagnosis of mixed axonal demyelinating neuropathy,  Please let patient know, Scanned the fire into epic

## 2022-06-11 NOTE — Telephone Encounter (Signed)
My chart message sent to the pt on this.

## 2022-07-28 DIAGNOSIS — M5136 Other intervertebral disc degeneration, lumbar region: Secondary | ICD-10-CM | POA: Diagnosis not present

## 2022-07-28 DIAGNOSIS — G894 Chronic pain syndrome: Secondary | ICD-10-CM | POA: Diagnosis not present

## 2022-07-28 DIAGNOSIS — Z79891 Long term (current) use of opiate analgesic: Secondary | ICD-10-CM | POA: Diagnosis not present

## 2022-08-04 DIAGNOSIS — Z1211 Encounter for screening for malignant neoplasm of colon: Secondary | ICD-10-CM | POA: Diagnosis not present

## 2022-08-20 DIAGNOSIS — L538 Other specified erythematous conditions: Secondary | ICD-10-CM | POA: Diagnosis not present

## 2022-08-20 DIAGNOSIS — L821 Other seborrheic keratosis: Secondary | ICD-10-CM | POA: Diagnosis not present

## 2022-08-20 DIAGNOSIS — L298 Other pruritus: Secondary | ICD-10-CM | POA: Diagnosis not present

## 2022-08-20 DIAGNOSIS — L820 Inflamed seborrheic keratosis: Secondary | ICD-10-CM | POA: Diagnosis not present

## 2022-08-20 DIAGNOSIS — D1801 Hemangioma of skin and subcutaneous tissue: Secondary | ICD-10-CM | POA: Diagnosis not present

## 2022-08-20 DIAGNOSIS — R208 Other disturbances of skin sensation: Secondary | ICD-10-CM | POA: Diagnosis not present

## 2022-08-20 DIAGNOSIS — Z85828 Personal history of other malignant neoplasm of skin: Secondary | ICD-10-CM | POA: Diagnosis not present

## 2022-08-20 DIAGNOSIS — Z08 Encounter for follow-up examination after completed treatment for malignant neoplasm: Secondary | ICD-10-CM | POA: Diagnosis not present

## 2022-08-20 DIAGNOSIS — L814 Other melanin hyperpigmentation: Secondary | ICD-10-CM | POA: Diagnosis not present

## 2022-09-18 DIAGNOSIS — K760 Fatty (change of) liver, not elsewhere classified: Secondary | ICD-10-CM | POA: Diagnosis not present

## 2022-09-18 DIAGNOSIS — R1084 Generalized abdominal pain: Secondary | ICD-10-CM | POA: Diagnosis not present

## 2022-09-18 DIAGNOSIS — R109 Unspecified abdominal pain: Secondary | ICD-10-CM | POA: Diagnosis not present

## 2022-09-18 DIAGNOSIS — K529 Noninfective gastroenteritis and colitis, unspecified: Secondary | ICD-10-CM | POA: Diagnosis not present

## 2022-09-18 DIAGNOSIS — Z8711 Personal history of peptic ulcer disease: Secondary | ICD-10-CM | POA: Diagnosis not present

## 2022-09-19 DIAGNOSIS — Z8711 Personal history of peptic ulcer disease: Secondary | ICD-10-CM | POA: Diagnosis not present

## 2022-09-19 DIAGNOSIS — K760 Fatty (change of) liver, not elsewhere classified: Secondary | ICD-10-CM | POA: Diagnosis not present

## 2022-09-19 DIAGNOSIS — K529 Noninfective gastroenteritis and colitis, unspecified: Secondary | ICD-10-CM | POA: Diagnosis not present

## 2022-09-19 DIAGNOSIS — R109 Unspecified abdominal pain: Secondary | ICD-10-CM | POA: Diagnosis not present

## 2022-11-05 DIAGNOSIS — G894 Chronic pain syndrome: Secondary | ICD-10-CM | POA: Diagnosis not present

## 2022-11-05 DIAGNOSIS — G609 Hereditary and idiopathic neuropathy, unspecified: Secondary | ICD-10-CM | POA: Diagnosis not present

## 2022-11-05 DIAGNOSIS — M5136 Other intervertebral disc degeneration, lumbar region: Secondary | ICD-10-CM | POA: Diagnosis not present

## 2022-11-05 DIAGNOSIS — Z79891 Long term (current) use of opiate analgesic: Secondary | ICD-10-CM | POA: Diagnosis not present

## 2022-11-27 ENCOUNTER — Ambulatory Visit: Payer: Medicare PPO | Admitting: Neurology

## 2022-11-27 ENCOUNTER — Other Ambulatory Visit: Payer: Self-pay | Admitting: Neurology

## 2022-11-27 ENCOUNTER — Encounter: Payer: Self-pay | Admitting: Neurology

## 2022-11-27 VITALS — BP 147/78 | HR 63 | Ht 69.0 in | Wt 231.5 lb

## 2022-11-27 DIAGNOSIS — G6289 Other specified polyneuropathies: Secondary | ICD-10-CM | POA: Diagnosis not present

## 2022-11-27 DIAGNOSIS — R269 Unspecified abnormalities of gait and mobility: Secondary | ICD-10-CM | POA: Diagnosis not present

## 2022-11-27 DIAGNOSIS — M792 Neuralgia and neuritis, unspecified: Secondary | ICD-10-CM

## 2022-11-27 MED ORDER — PROPRANOLOL HCL 20 MG PO TABS: 20.0000 mg | ORAL_TABLET | Freq: Two times a day (BID) | ORAL | 4 refills | Status: DC

## 2022-11-27 MED ORDER — GABAPENTIN 800 MG PO TABS: 800.0000 mg | ORAL_TABLET | Freq: Three times a day (TID) | ORAL | 4 refills | Status: DC

## 2022-11-27 NOTE — Progress Notes (Signed)
ASSESSMENT AND PLAN  David Li is a 82 y.o. male   Progressive disability with neuropathy, progressive worsening neuropathic pain  EMG nerve conduction study significant worsening compared to previous study in 2017, definite axonal loss, could not rule out demyelinating component, especially with his elevated CSF total protein of 63 in the past,  I have discussed with him possibility of IVIG treatment, he wants to hold off the process, He is to continue current chronic neuropathic medications, Cymbalta 60 mg daily gabapentin 800 mg 3 times a day, getting oxycodone 5 mg 4 times a day from pain management doctor Ramos.   DIAGNOSTIC DATA (LABS, IMAGING, TESTING) - I reviewed patient records, labs, notes, testing and imaging myself where available. Laboratory evaluations from May 05, 2022, normal CBC hemoglobin 14.7, normal CMP creatinine of 0.59, A1c 6.6, TSH was elevated 6.57, LDL was 125  MEDICAL HISTORY: David Li was followed by Dr. Thad Ranger since April 2006, he presented with sensory changes starting at the sole of his feet, gradually ascending to bilateral lower extremity, fairly symmetric.  EMG nerve conduction study and spine imaging were negative initially,  In August 2006 he was hospitalized with fever and neuralgia, temporal artery biopsy demonstrated vasculitis, he was treated with methotrexate and prednisone with good response.  He is no longer on immunosuppressive treatment  His neuropathic symptoms continue to progress, require higher dose of gabapentin, polypharmacy of nortriptyline,  Over the years, his lower extremity paresthesia, numbness tingling neuropathic pain continue to progress, he enjoys work with rescue dogs, used to be very physically active, is a retired Engineering geologist, now has difficulty with his balance  EMG nerve conduction study in January 2017 showed mild length-dependent peripheral neuropathy, no significant demyelinating features  Around  2017, he also noticed bilateral fingertips numbness tightness sensation, spinal fluid at that time showed total protein 63 with normal glucose and cells,  Over the years he is on titrating dose of medication for his lower extremity neuropathic pain, began to see pain management Dr. Alger Simons since 2021, was taking oxycodone, was changed to Dilaudid since 2022,  He also developed gradual onset slow worsening bilateral hands tremor, began to receive low dose beta-blocker, seems to help his symptoms  He is now on polypharmacy treatment, Cymbalta 60 mg daily, gabapentin 800 mg 3 times a day, Dilaudid 4 mg 4 times a day, also take Ambien low-dose for difficulty sleeping at nighttime  Extensive laboratory evaluation around 2016 showed normal negative RPR, C-reactive protein, thyroid functional test, B12, ANA, protein electrophoresis, Lyme titer, hepatitis, CBC, CMP, A1c was 6.1,  Spinal fluid testing November 2016, WBC 1, total protein of 63  UPDATE November 27 2022: He works at [employer] twice a week, hands are more numb, burning, can still manage his tools, neuropathic pain medicine works well for him,Cymbalta 60 mg daily, gabapentin 800 mg 3 times a day, oxycodone 5mg  4 times a day, again discussed IVIG treatment, he does not want to proceed   PHYSICAL EXAM: Vitals:   11/27/22 1340  Weight: 231 lb 8 oz (105 kg)  Height: 5\' 9"  (1.753 m)    PHYSICAL EXAMNIATION:  Gen: NAD, conversant, well nourised, well groomed                     Cardiovascular: Regular rate rhythm, no peripheral edema, warm, nontender. Eyes: Conjunctivae clear without exudates or hemorrhage Neck: Supple, no carotid bruits. Pulmonary: Clear to auscultation bilaterally   NEUROLOGICAL EXAM:  MENTAL STATUS: Speech/cognition:  Awake, alert, oriented to history taking and casual conversation CRANIAL NERVES: CN II: Visual fields are full to confrontation. Pupils are round equal and briskly reactive to light. CN III, IV, VI:  extraocular movement are normal. No ptosis. CN V: Facial sensation is intact to light touch CN VII: Face is symmetric with normal eye closure  CN VIII: Hearing is normal to causal conversation. CN IX, X: Phonation is normal. CN XI: Head turning and shoulder shrug are intact  MOTOR: Moderate bilateral hands postural tremor, intrinsic hand muscle atrophy, right worse than left, mild to moderate finger abduction weakness,  REFLEXES: Reflexes were absent  SENSORY: Absent vibratory sensation at knee, length-dependent decreased light touch pinprick to above the knee level, preserved finger proprioception, mildly decreased finger vibratory sensation  COORDINATION: There is no trunk or limb dysmetria noted.  GAIT/STANCE: Need push-up to get up from seated position, cautious, mildly unsteady, positive Romberg sign  REVIEW OF SYSTEMS:  Full 14 system review of systems performed and notable only for as above All other review of systems were negative.   ALLERGIES: Allergies  Allergen Reactions   Amoxicillin Nausea Only    Has patient had a PCN reaction causing immediate rash, facial/tongue/throat swelling, SOB or lightheadedness with hypotension: No Has patient had a PCN reaction causing severe rash involving mucus membranes or skin necrosis: No Has patient had a PCN reaction that required hospitalization: No Has patient had a PCN reaction occurring within the last 10 years: Yes If all of the above answers are "NO", then may proceed with Cephalosporin use.   Macrobid  [Nitrofurantoin Macrocrystal]    Other     Muscle relaxant medication-unsure of the name    HOME MEDICATIONS: Current Outpatient Medications  Medication Sig Dispense Refill   ACCU-CHEK GUIDE test strip      acyclovir (ZOVIRAX) 400 MG tablet Take 400 mg by mouth daily as needed (cold sores). Outbreaks      Azelastine HCl 0.15 % SOLN Place 1 spray into both nostrils at bedtime.   3   Azelastine HCl 137 MCG/SPRAY SOLN Place  into both nostrils.     Cholecalciferol (VITAMIN D) 2000 units CAPS Take 2,000 Units by mouth daily.     clopidogrel (PLAVIX) 75 MG tablet Take 1 tablet (75 mg total) by mouth daily. 90 tablet 3   DULoxetine (CYMBALTA) 60 MG capsule TAKE 1 CAPSULE BY MOUTH EVERY DAY 90 capsule 4   gabapentin (NEURONTIN) 800 MG tablet Take 1 tablet (800 mg total) by mouth 3 (three) times daily. 270 tablet 4   loratadine (CLARITIN) 10 MG tablet 1 tablet     naloxone (NARCAN) nasal spray 4 mg/0.1 mL Narcan 4 mg/actuation nasal spray     omeprazole (PRILOSEC) 40 MG capsule Take 1 capsule (40 mg total) by mouth 2 (two) times daily before a meal. 60 capsule 1   oxycodone-acetaminophen (PERCOCET) 2.5-325 MG tablet Take 1 tablet by mouth every 6 (six) hours as needed for pain (4mg  tablets).     prochlorperazine (COMPAZINE) 5 MG tablet Take 2 tablets (10 mg total) by mouth every 6 (six) hours as needed for nausea or vomiting. 15 tablet 1   promethazine (PHENERGAN) 12.5 MG suppository Place 1 suppository (12.5 mg total) rectally every 6 (six) hours as needed for nausea, vomiting or refractory nausea / vomiting. 12 each 0   propranolol (INDERAL) 20 MG tablet TAKE 1 TABLET BY MOUTH TWICE A DAY 180 tablet 2   rosuvastatin (CRESTOR) 5 MG tablet rosuvastatin 5 mg  tablet     thiamine (VITAMIN B-1) 100 MG tablet Take 100 mg by mouth daily.     trimethoprim (TRIMPEX) 100 MG tablet Take 100 mg by mouth daily.  11   ALPRAZolam (XANAX) 0.25 MG tablet Take 0.25 mg by mouth daily as needed for anxiety.  (Patient not taking: Reported on 11/27/2022)     HYDROmorphone (DILAUDID) 4 MG tablet hydromorphone 4 mg tablet  TAKE 1 TABLET BY MOUTH 4 TIMES A DAY AS NEEDED (Patient not taking: Reported on 11/27/2022)     No current facility-administered medications for this visit.    PAST MEDICAL HISTORY: Past Medical History:  Diagnosis Date   BPH (benign prostatic hyperplasia)    self caths 1-2 times daily.   Neuromuscular disorder (HCC)     idiopathic,both feet,progressing to legs   Perforated ulcer (HCC) 1982   Stroke [facility])    states the Plavix is preventative due to the strong family history of stroke   Superficial basal cell carcinoma (BCC) 03/25/2021   Right Anterior Neck    PAST SURGICAL HISTORY: Past Surgical History:  Procedure Laterality Date   CHOLECYSTECTOMY N/A 04/07/2016   Procedure: LAPAROSCOPIC CHOLECYSTECTOMY;  Surgeon: Rodman Pickle, MD;  Location: [facility];  Service: General;  Laterality: N/A;   ESOPHAGOGASTRODUODENOSCOPY N/A 03/05/2016   Procedure: ESOPHAGOGASTRODUODENOSCOPY (EGD);  Surgeon: Carman Ching, MD;  Location: [facility];  Service: Endoscopy;  Laterality: N/A;   HERNIA REPAIR  2012   TONSILLECTOMY     TRANSURETHRAL RESECTION OF PROSTATE  2006    FAMILY HISTORY: Family History  Problem Relation Age of Onset   Stroke Mother    Diabetes Mother    Stroke Father    Diabetes Father    Diabetes Sister    Diabetes Brother     SOCIAL HISTORY: Social History   Socioeconomic History   Marital status: Married    Spouse name: Rosalita Chessman   Number of children: 2   Years of education: college   Highest education level: Not on file  Occupational History    Comment: retired  Tobacco Use   Smoking status: Former   Smokeless tobacco: Never  Substance and Sexual Activity   Alcohol use: Yes    Alcohol/week: 1.0 standard drink of alcohol    Types: 1 Glasses of wine per week    Comment: daily   Drug use: No   Sexual activity: Not on file  Other Topics Concern   Not on file  Social History Narrative   Patient is retired, Married for 26 years Rosalita Chessman).   EducationDesigner, jewellery.   Right handed.   Caffeine- five cups daily only tea         Social Determinants of Health   Financial Resource Strain: Not on file  Food Insecurity: Not on file  Transportation Needs: Not on file  Physical Activity: Not on file  Stress: Not on file  Social Connections: Unknown  (09/18/2022)   Received from [facility], [facility]   Social Network    Social Network: Not on file  Intimate Partner Violence: Not At Risk (09/18/2022)   Received from [facility], [facility]   HITS    Over the last 12 months how often did your partner physically hurt you?: 1    Over the last 12 months how often did your partner insult you or talk down to you?: 1    Over the last 12 months how often did your partner threaten you with physical harm?: 1  Over the last 12 months how often did your partner scream or curse at you?: 1      Levert Feinstein, M.D. Ph.D.  [facility] [address], [address] [place], [place] [address] Ph: [phone] Fax: [phone]  CC:  Mila Palmer, MD [address] [place],  [place] [address]  Mila Palmer, MD

## 2023-01-05 DIAGNOSIS — M1711 Unilateral primary osteoarthritis, right knee: Secondary | ICD-10-CM | POA: Diagnosis not present

## 2023-01-22 DIAGNOSIS — M170 Bilateral primary osteoarthritis of knee: Secondary | ICD-10-CM | POA: Diagnosis not present

## 2023-01-26 DIAGNOSIS — M1712 Unilateral primary osteoarthritis, left knee: Secondary | ICD-10-CM | POA: Insufficient documentation

## 2023-01-29 DIAGNOSIS — M170 Bilateral primary osteoarthritis of knee: Secondary | ICD-10-CM | POA: Diagnosis not present

## 2023-02-06 DIAGNOSIS — M170 Bilateral primary osteoarthritis of knee: Secondary | ICD-10-CM | POA: Diagnosis not present

## 2023-02-08 ENCOUNTER — Other Ambulatory Visit: Payer: Self-pay | Admitting: Neurology

## 2023-03-09 DIAGNOSIS — Z79891 Long term (current) use of opiate analgesic: Secondary | ICD-10-CM | POA: Diagnosis not present

## 2023-03-09 DIAGNOSIS — G894 Chronic pain syndrome: Secondary | ICD-10-CM | POA: Diagnosis not present

## 2023-03-09 DIAGNOSIS — M51361 Other intervertebral disc degeneration, lumbar region with lower extremity pain only: Secondary | ICD-10-CM | POA: Diagnosis not present

## 2023-03-09 DIAGNOSIS — G609 Hereditary and idiopathic neuropathy, unspecified: Secondary | ICD-10-CM | POA: Diagnosis not present

## 2023-03-10 DIAGNOSIS — Z5181 Encounter for therapeutic drug level monitoring: Secondary | ICD-10-CM | POA: Diagnosis not present

## 2023-03-10 DIAGNOSIS — Z79899 Other long term (current) drug therapy: Secondary | ICD-10-CM | POA: Diagnosis not present

## 2023-03-27 DIAGNOSIS — M170 Bilateral primary osteoarthritis of knee: Secondary | ICD-10-CM | POA: Diagnosis not present

## 2023-04-20 DIAGNOSIS — Z0000 Encounter for general adult medical examination without abnormal findings: Secondary | ICD-10-CM | POA: Diagnosis not present

## 2023-04-20 DIAGNOSIS — E1169 Type 2 diabetes mellitus with other specified complication: Secondary | ICD-10-CM | POA: Diagnosis not present

## 2023-04-20 DIAGNOSIS — Z8673 Personal history of transient ischemic attack (TIA), and cerebral infarction without residual deficits: Secondary | ICD-10-CM | POA: Diagnosis not present

## 2023-04-20 DIAGNOSIS — Z79899 Other long term (current) drug therapy: Secondary | ICD-10-CM | POA: Diagnosis not present

## 2023-04-20 DIAGNOSIS — F325 Major depressive disorder, single episode, in full remission: Secondary | ICD-10-CM | POA: Diagnosis not present

## 2023-04-20 DIAGNOSIS — E1140 Type 2 diabetes mellitus with diabetic neuropathy, unspecified: Secondary | ICD-10-CM | POA: Diagnosis not present

## 2023-04-20 DIAGNOSIS — M353 Polymyalgia rheumatica: Secondary | ICD-10-CM | POA: Diagnosis not present

## 2023-04-20 DIAGNOSIS — E7800 Pure hypercholesterolemia, unspecified: Secondary | ICD-10-CM | POA: Diagnosis not present

## 2023-07-06 DIAGNOSIS — M25661 Stiffness of right knee, not elsewhere classified: Secondary | ICD-10-CM | POA: Diagnosis not present

## 2023-07-06 DIAGNOSIS — M1711 Unilateral primary osteoarthritis, right knee: Secondary | ICD-10-CM | POA: Diagnosis not present

## 2023-07-06 DIAGNOSIS — M25561 Pain in right knee: Secondary | ICD-10-CM | POA: Diagnosis not present

## 2023-07-08 DIAGNOSIS — G609 Hereditary and idiopathic neuropathy, unspecified: Secondary | ICD-10-CM | POA: Diagnosis not present

## 2023-07-08 DIAGNOSIS — M51361 Other intervertebral disc degeneration, lumbar region with lower extremity pain only: Secondary | ICD-10-CM | POA: Diagnosis not present

## 2023-07-08 DIAGNOSIS — Z79891 Long term (current) use of opiate analgesic: Secondary | ICD-10-CM | POA: Diagnosis not present

## 2023-07-08 DIAGNOSIS — Z608 Other problems related to social environment: Secondary | ICD-10-CM | POA: Diagnosis not present

## 2023-07-08 DIAGNOSIS — G894 Chronic pain syndrome: Secondary | ICD-10-CM | POA: Diagnosis not present

## 2023-07-08 NOTE — H&P (Signed)
 TOTAL KNEE ADMISSION H&P  Patient is being admitted for right total knee arthroplasty.  Subjective:  Chief Complaint: Right knee pain.  HPI: David Li, 83 y.o. male has a history of pain and functional disability in the right knee due to arthritis and has failed non-surgical conservative treatments for greater than 12 weeks to include NSAID's and/or analgesics, corticosteriod injections, viscosupplementation injections, use of assistive devices, and activity modification. Onset of symptoms was gradual, starting several years ago with gradually worsening course since that time. The patient noted no past surgery on the right knee.  Patient currently rates pain in the right knee at 8 out of 10 with activity. Patient has night pain, worsening of pain with activity and weight bearing, and pain that interferes with activities of daily living. Patient has evidence of  bone-on-bone arthritis in the medial and patellofemoral compartments of the right knee  by imaging studies. There is no active infection.  Patient Active Problem List   Diagnosis Date Noted   Mixed axonal-demyelinating neuropathy 05/22/2022   Gait abnormality 02/01/2022   Allergic rhinitis 11/19/2021   Benign prostatic hyperplasia with lower urinary tract symptoms 11/19/2021   Cerebral infarction (HCC) 11/19/2021   Enlarged prostate 11/19/2021   Essential hypertension 11/19/2021   Fall 11/19/2021   Gastro-esophageal reflux disease without esophagitis 11/19/2021   Giant cell arteritis (HCC) 11/19/2021   Herpesviral infection, unspecified 11/19/2021   History of peptic ulcer 11/19/2021   History of transurethral resection of prostate 11/19/2021   Hyperglycemia 11/19/2021   Hypertensive heart disease without congestive heart failure 11/19/2021   Insomnia 11/19/2021   Lesion of ulnar nerve 11/19/2021   Major depression in full remission (HCC) 11/19/2021   Polymyalgia rheumatica (HCC) 11/19/2021   Protein calorie malnutrition  (HCC) 11/19/2021   Pure hypercholesterolemia 11/19/2021   Senile purpura (HCC) 11/19/2021   Tremor 11/19/2021   Type 2 diabetes mellitus with other specified complication (HCC) 11/19/2021   Lumbar post-laminectomy syndrome 07/18/2021   Acquired thrombophilia (HCC) 03/20/2021   Chronic pain syndrome 04/25/2020   Degeneration of lumbar intervertebral disc 04/25/2020   Carpal tunnel syndrome of right wrist 12/28/2017   Neuropathic pain 12/15/2017   History of stroke 12/01/2016   Cholelithiasis    Sepsis (HCC) 03/02/2016   Prolonged Q-T interval on ECG 03/02/2016   Hypokalemia 03/02/2016   Neurogenic bladder 03/02/2016   Pneumonia 02/28/2016   Acute respiratory failure with hypoxia (HCC) 02/28/2016   Nausea without vomiting 02/28/2016   Acute respiratory failure (HCC) 02/28/2016   Low back pain 05/28/2015   Paresthesia 05/28/2015   Tremor, essential 03/02/2014   Hereditary and idiopathic peripheral neuropathy 03/01/2013   Vasculitis (HCC) 03/01/2013    Past Medical History:  Diagnosis Date   BPH (benign prostatic hyperplasia)    self caths 1-2 times daily.   Neuromuscular disorder (HCC)    idiopathic,both feet,progressing to legs   Perforated ulcer (HCC) 1982   Stroke [facility])    states the Plavix is preventative due to the strong family history of stroke   Superficial basal cell carcinoma (BCC) 03/25/2021   Right Anterior Neck    Past Surgical History:  Procedure Laterality Date   CHOLECYSTECTOMY N/A 04/07/2016   Procedure: LAPAROSCOPIC CHOLECYSTECTOMY;  Surgeon: Rodman Pickle, MD;  Location: [facility];  Service: General;  Laterality: N/A;   ESOPHAGOGASTRODUODENOSCOPY N/A 03/05/2016   Procedure: ESOPHAGOGASTRODUODENOSCOPY (EGD);  Surgeon: Carman Ching, MD;  Location: [facility];  Service: Endoscopy;  Laterality: N/A;   HERNIA REPAIR  2012  TONSILLECTOMY     TRANSURETHRAL RESECTION OF PROSTATE  2006    Prior to Admission medications    Medication Sig Start Date End Date Taking? Authorizing Provider  ACCU-CHEK GUIDE test strip  04/27/21   [provider]  acyclovir (ZOVIRAX) 400 MG tablet Take 400 mg by mouth daily as needed (cold sores). Outbreaks     [provider]  ALPRAZolam Prudy Feeler) 0.25 MG tablet Take 0.25 mg by mouth daily as needed for anxiety.  Patient not taking: Reported on 11/27/2022    [provider]  Azelastine HCl 0.15 % SOLN Place 1 spray into both nostrils at bedtime.  10/04/17   [provider]  Azelastine HCl 137 MCG/SPRAY SOLN Place into both nostrils. 05/02/21   [provider]  Cholecalciferol (VITAMIN D) 2000 units CAPS Take 2,000 Units by mouth daily.    [provider]  clopidogrel (PLAVIX) 75 MG tablet TAKE 1 TABLET BY MOUTH EVERY DAY 02/09/23   Glean Salvo, NP  DULoxetine (CYMBALTA) 60 MG capsule TAKE 1 CAPSULE BY MOUTH EVERY DAY 11/27/22   Levert Feinstein, MD  gabapentin (NEURONTIN) 800 MG tablet Take 1 tablet (800 mg total) by mouth 3 (three) times daily. 11/27/22   Levert Feinstein, MD  loratadine (CLARITIN) 10 MG tablet 1 tablet    [provider]  naloxone (NARCAN) nasal spray 4 mg/0.1 mL Narcan 4 mg/actuation nasal spray    [provider]  omeprazole (PRILOSEC) 40 MG capsule Take 1 capsule (40 mg total) by mouth 2 (two) times daily before a meal. 03/05/16   Johnson, Clanford L, MD  oxycodone (OXY-IR) 5 MG capsule Take 5 mg by mouth every 6 (six) hours as needed for pain.    [provider]  oxycodone-acetaminophen (PERCOCET) 2.5-325 MG tablet Take 1 tablet by mouth every 6 (six) hours as needed for pain (4mg  tablets).    [provider]  prochlorperazine (COMPAZINE) 5 MG tablet Take 2 tablets (10 mg total) by mouth every 6 (six) hours as needed for nausea or vomiting. 01/26/18   Elpidio Anis, PA-C  promethazine (PHENERGAN) 12.5 MG suppository Place 1 suppository (12.5 mg total) rectally every 6 (six) hours as needed  for nausea, vomiting or refractory nausea / vomiting. 01/26/18   Elpidio Anis, PA-C  propranolol (INDERAL) 20 MG tablet Take 1 tablet (20 mg total) by mouth 2 (two) times daily. 11/27/22   Levert Feinstein, MD  rosuvastatin (CRESTOR) 5 MG tablet rosuvastatin 5 mg tablet    [provider]  thiamine (VITAMIN B-1) 100 MG tablet Take 100 mg by mouth daily.    [provider]  trimethoprim (TRIMPEX) 100 MG tablet Take 100 mg by mouth daily. 11/22/16   [provider]    Allergies  Allergen Reactions   Amoxicillin Nausea Only    Has patient had a PCN reaction causing immediate rash, facial/tongue/throat swelling, SOB or lightheadedness with hypotension: No Has patient had a PCN reaction causing severe rash involving mucus membranes or skin necrosis: No Has patient had a PCN reaction that required hospitalization: No Has patient had a PCN reaction occurring within the last 10 years: Yes If all of the above answers are "NO", then may proceed with Cephalosporin use.   Macrobid  [Nitrofurantoin Macrocrystal]    Other     Muscle relaxant medication-unsure of the name    Social History   Socioeconomic History   Marital status: Married    Spouse name: Rosalita Chessman   Number of children: 2  Years of education: college   Highest education level: Not on file  Occupational History    Comment: retired  Tobacco Use   Smoking status: Former   Smokeless tobacco: Never  Substance and Sexual Activity   Alcohol use: Yes    Alcohol/week: 1.0 standard drink of alcohol    Types: 1 Glasses of wine per week    Comment: daily   Drug use: No   Sexual activity: Not on file  Other Topics Concern   Not on file  Social History Narrative   Patient is retired, Married for 26 years Rosalita Chessman).   EducationDesigner, jewellery.   Right handed.   Caffeine- five cups daily only tea         Social Drivers of Corporate investment banker Strain: Not on file  Food Insecurity: Not on file  Transportation  Needs: Not on file  Physical Activity: Not on file  Stress: Not on file  Social Connections: Unknown (09/18/2022)   [facility]   Social Network    Social Network: Not on file  Intimate Partner Violence: Not At Risk (09/18/2022)   [facility] from [facility], [facility]   HITS    Over the last 12 months how often did your partner physically hurt you?: Never    Over the last 12 months how often did your partner insult you or talk down to you?: Never    Over the last 12 months how often did your partner threaten you with physical harm?: Never    Over the last 12 months how often did your partner scream or curse at you?: Never    Tobacco Use: Medium Risk (11/27/2022)   Patient History    Smoking Tobacco Use: Former    Smokeless Tobacco Use: Never    Passive Exposure: Not on file   Social History   Substance and Sexual Activity  Alcohol Use Yes   Alcohol/week: 1.0 standard drink of alcohol   Types: 1 Glasses of wine per week   Comment: daily    Family History  Problem Relation Age of Onset   Stroke Mother    Diabetes Mother    Stroke Father    Diabetes Father    Diabetes Sister    Diabetes Brother     ROS  Objective:  Physical Exam: Well nourished and well developed.  General: Alert and oriented x3, cooperative and pleasant, no acute distress.  Head: normocephalic, atraumatic, neck supple.  Eyes: EOMI. Abdomen: non-tender to palpation and soft, normoactive bowel sounds. Musculoskeletal: - Right knee shows a moderate-sized effusion. Range of motion is 5-125 degrees with crepitus on range of motion. Tender medially. No lateral tenderness or instability noted. Gait pattern is antalgic on the right.  - Left knee: No effusion. Range of motion is 0-125 degrees. Slight tenderness medially. No lateral tenderness or instability noted. Calves soft and nontender. Motor function intact in LE. Strength 5/5 LE bilaterally. Neuro: Distal pulses 2+.  Sensation to light touch intact in LE.  Vital signs in last 24 hours: BP: ()/()  Arterial Line BP: ()/()   Imaging Review Plain radiographs demonstrate severe degenerative joint disease of the right knee. The overall alignment is neutral. The bone quality appears to be adequate for age and reported activity level.  Assessment/Plan:  End stage arthritis, right knee   The patient history, physical examination, clinical judgment of the provider and imaging studies are consistent with end stage degenerative joint disease of the right knee and  total knee arthroplasty is deemed medically necessary. The treatment options including medical management, injection therapy arthroscopy and arthroplasty were discussed at length. The risks and benefits of total knee arthroplasty were presented and reviewed. The risks due to aseptic loosening, infection, stiffness, patella tracking problems, thromboembolic complications and other imponderables were discussed. The patient acknowledged the explanation, agreed to proceed with the plan and consent was signed. Patient is being admitted for inpatient treatment for surgery, pain control, PT, OT, prophylactic antibiotics, VTE prophylaxis, progressive ambulation and ADLs and discharge planning. The patient is planning to be discharged  home .  Patient's anticipated LOS is less than 2 midnights, meeting these requirements: - Lives within 1 hour of care - Has a competent adult at home to recover with post-op - NO history of  - Coronary Artery Disease  - Heart failure  - Heart attack  - DVT/VTE  - Cardiac arrhythmia  - Respiratory Failure/COPD  - Renal failure  - Anemia  - Advanced Liver disease  Therapy Plans: EO Disposition: Home with Wife Planned DVT Prophylaxis: Plavix + Aspirin DME Needed: RW PCP: Mila Palmer, MD (clearance received) TXA: IV Allergies: amoxicillin (nausea), macrobid Anesthesia Concerns: None BMI: 32.3 Last HgbA1c: 7.5% - 04/2023  (unmedicated diabetes)  Pharmacy: [facility] (deliver to room)  Other: -Takes oxycodone 10 mg QID - discussed dilaudid for breakthrough pain -Per MD, stop Plavix 5 days prior - primarily taking the plavix as preventative for stroke. Did have possibly one small TIA a few years ago. -Prefers cyclobenzaprine - thinks he may have had a past adverse effect to methocarbamol  - Patient was instructed on what medications to stop prior to surgery. - Follow-up visit in 2 weeks with Dr. Lequita Halt - Begin physical therapy following surgery - Pre-operative lab work as pre-surgical testing - Prescriptions will be provided in hospital at time of discharge  R. Arcola Jansky, PA-C Orthopedic Surgery [facility] [place]

## 2023-07-13 DIAGNOSIS — Z5181 Encounter for therapeutic drug level monitoring: Secondary | ICD-10-CM | POA: Diagnosis not present

## 2023-07-13 DIAGNOSIS — Z79899 Other long term (current) drug therapy: Secondary | ICD-10-CM | POA: Diagnosis not present

## 2023-07-14 DIAGNOSIS — M25661 Stiffness of right knee, not elsewhere classified: Secondary | ICD-10-CM | POA: Insufficient documentation

## 2023-07-14 DIAGNOSIS — M25561 Pain in right knee: Secondary | ICD-10-CM | POA: Insufficient documentation

## 2023-07-21 NOTE — Progress Notes (Signed)
 COVID Vaccine received:  []  No [x]  Yes Date of any COVID positive Test in last 90 days:  None  PCP - Mila Palmer, MD  Medical clearance scanned in Media 05-11-23   [id number] (Work)  Cardiologist - none Neurology- Levert Feinstein, MD  Pain Mgmt- Sheran Luz, MD, Deneise Lever, NP (LOV 07-08-23)  Chest x-ray - 06-04-2016  2v  Epic EKG - 06-05-2016 Epic  will repeat   Stress Test -  ECHO - 02-29-2016  Epic Cardiac Cath -   PCR screen: [x]  Ordered & Completed []   No Order but Needs PROFEND     []   N/A for this surgery  Surgery Plan:  []  Ambulatory   [x]  Outpatient in bed  []  Admit Anesthesia:    []  General  []  Spinal  [x]   Choice []   MAC  Pacemaker / ICD device [x]  No []  Yes   Spinal Cord Stimulator:[x]  No []  Yes       History of Sleep Apnea? [x]  No []  Yes   CPAP used?- [x]  No []  Yes    Does the patient monitor blood sugar?   []  N/A   []  No [x]  Yes  Patient has: []  NO Hx DM   []  Pre-DM   []  DM1  [x]   DM2 Last A1c was: 7.5 on  04-20-2023    Does patient have a [employer] or Dexcom? []  No []  Yes   Fasting Blood Sugar Ranges- 130-155 Checks Blood Sugar __2-3 times a week TAKES NO DIABETIC MEDICATION  Blood Thinner / Instructions: Plavix  Hold x 5 days  Last: 07-28-23 Aspirin Instructions: none  ERAS Protocol Ordered: []  No  [x]  Yes PRE-SURGERY []  ENSURE  [x]  G2 Patient is to be NPO after: 0615  Dental hx: []  Dentures:  [x]  N/A      []  Bridge or Partial:                   []  Loose or Damaged teeth:   Comments: Patient was given the 5 CHG shower / bath instructions for TKA surgery along with 2 bottles of the CHG soap. Patient will start this on: 07-30-23. All questions were asked and answered, Patient voiced understanding of this process.   Activity level: Patient is unable to climb a flight of stairs without difficulty; [x]  No CP  [x]  No SOB, but would have leg and back pain  Patient can / can not perform ADLs without assistance.   Anesthesia review: DM2- No medication,  HTN, Mixed axonal demyelinating neuropathy, essential tremor, Hx CVA, CPS- sees Dr. Ethelene Hal, GERD,   Patient denies shortness of breath, fever, cough and chest pain at PAT appointment.  Patient verbalized understanding and agreement to the Pre-Surgical Instructions that were given to them at this PAT appointment. Patient was also educated of the need to review these PAT instructions again prior to his surgery.I reviewed the appropriate phone numbers to call if they have any and questions or concerns.

## 2023-07-21 NOTE — Patient Instructions (Signed)
 SURGICAL WAITING ROOM VISITATION Patients having surgery or a procedure may have no more than 2 support people in the waiting area - these visitors may rotate in the visitor waiting room.   Due to an increase in RSV and influenza rates and associated hospitalizations, children ages 55 and under may not visit patients in [facility] hospitals. If the patient needs to stay at the hospital during part of their recovery, the visitor guidelines for inpatient rooms apply.  PRE-OP VISITATION  Pre-op nurse will coordinate an appropriate time for 1 support person to accompany the patient in pre-op.  This support person may not rotate.  This visitor will be contacted when the time is appropriate for the visitor to come back in the pre-op area.  Please refer to the [facility] website for the visitor guidelines for Inpatients (after your surgery is over and you are in a regular room).  You are not required to quarantine at this time prior to your surgery. However, you must do this: Hand Hygiene often Do NOT share personal items Notify your provider if you are in close contact with someone who has COVID or you develop fever 100.4 or greater, new onset of sneezing, cough, sore throat, shortness of breath or body aches.  If you test positive for Covid or have been in contact with anyone that has tested positive in the last 10 days please notify you surgeon.    Your procedure is scheduled on:  [facility]  August 03, 2023   Report to [facility] Main Entrance: Leota Jacobsen entrance where the [employer] is available.   Report to admitting at: 06:45    AM  Call this number if you have any questions or problems the morning of surgery [phone]  Do not eat food after Midnight the night prior to your surgery/procedure.  After Midnight you may have the following liquids until 06:15  AM  DAY OF SURGERY  Clear Liquid Diet Water Black Coffee (sugar ok, NO MILK/CREAM OR CREAMERS)  Tea (sugar ok, NO  MILK/CREAM OR CREAMERS) regular and decaf                             Plain Jell-O  with no fruit (NO RED)                                           Fruit ices (not with fruit pulp, NO RED)                                     Popsicles (NO RED)                                                                  Juice: NO CITRUS JUICES: only apple, WHITE grape, WHITE cranberry Sports drinks like Gatorade or Powerade (NO RED)                   The day of surgery:  Drink ONE (1) Pre-Surgery G2 at  06:15 AM the morning of  surgery. Drink in one sitting. Do not sip.  This drink was given to you during your hospital pre-op appointment visit. Nothing else to drink after completing the Pre-Surgery G2 : No candy, chewing gum or throat lozenges.    FOLLOW ANY ADDITIONAL PRE OP INSTRUCTIONS YOU RECEIVED FROM YOUR SURGEON'S OFFICE!!!   Oral Hygiene is also important to reduce your risk of infection.        Remember - BRUSH YOUR TEETH THE MORNING OF SURGERY WITH YOUR REGULAR TOOTHPASTE  Do NOT smoke after Midnight the night before surgery.  STOP TAKING all Vitamins, Herbs and supplements 1 week before your surgery.   PLAVIX- Stop taking 5 days before your surgery. Last dose will be taken on Tuesday  July 28, 2023  Take ONLY these medicines the morning of surgery with A SIP OF WATER: Propranolol, duloxetine, gabapentin. If needed, you may take Alprazolam, Oxycodone HCL and Percocet.  You may use your Nasal spray if needed.,                     You may not have any metal on your body including  jewelry, and body piercing  Do not wear  lotions, powders,  cologne, or deodorant  Men may shave face and neck.  Contacts, Hearing Aids, dentures or bridgework may not be worn into surgery. DENTURES WILL BE REMOVED PRIOR TO SURGERY PLEASE DO NOT APPLY "Poly grip" OR ADHESIVES!!!  You may bring a small overnight bag with you on the day of surgery, only pack items that are not valuable. [facility] IS NOT  RESPONSIBLE   FOR VALUABLES THAT ARE LOST OR STOLEN.   Do not bring your home medications to the hospital. The Pharmacy will dispense medications listed on your medication list to you during your admission in the Hospital.  Special Instructions: Bring a copy of your healthcare power of attorney and living will documents the day of surgery, if you wish to have them scanned into your [facility] Records- EPIC  Please read over the following fact sheets you were given: IF YOU HAVE QUESTIONS ABOUT YOUR PRE-OP INSTRUCTIONS, PLEASE CALL [phone].     Pre-operative 5 CHG Bath Instructions   You can play a key role in reducing the risk of infection after surgery. Your skin needs to be as free of germs as possible. You can reduce the number of germs on your skin by washing with CHG (chlorhexidine gluconate) soap before surgery. CHG is an antiseptic soap that kills germs and continues to kill germs even after washing.   DO NOT use if you have an allergy to chlorhexidine/CHG or antibacterial soaps. If your skin becomes reddened or irritated, stop using the CHG and notify one of our RNs at [phone]  Please shower with the CHG soap starting 4 days before surgery using the following schedule: START SHOWERS ON   THURSDAY  July 30, 2023  Please keep in mind the following:  DO NOT shave, including legs and underarms, starting the day of your first shower.   You may shave your face at any point before/day of surgery.   Place clean sheets on your bed the day you start using CHG soap. Use a clean washcloth (not used since being washed) for each shower. DO NOT sleep with pets once you start using the CHG.   CHG Shower Instructions:  If you choose to wash your hair and private area, wash first with your normal shampoo/soap.   After you use shampoo/soap, rinse your hair and body thoroughly to remove shampoo/soap residue.  Turn the water OFF and apply about 3 tablespoons (45 ml) of CHG soap to a CLEAN washcloth.  Apply CHG soap ONLY FROM YOUR NECK DOWN TO YOUR TOES (washing for 3-5 minutes)  DO NOT use CHG soap on face, private areas, open wounds, or sores.  Pay special attention to the area where your surgery is being performed.  If you are having back surgery, having someone wash your back for you may be helpful.  Wait 2 minutes after CHG soap is applied, then you may rinse off the CHG soap.  Pat dry with a clean towel  Put on clean clothes/pajamas   If you choose to wear lotion, please use ONLY the CHG-compatible lotions on the back of this paper.     Additional instructions for the day of surgery: DO NOT APPLY any lotions, deodorants, cologne, or perfumes.   Put on clean/comfortable clothes.  Brush your teeth.  Ask your nurse before applying any prescription medications to the skin.      CHG Compatible Lotions   Aveeno Moisturizing lotion  Cetaphil Moisturizing Cream  Cetaphil Moisturizing Lotion  Clairol Herbal Essence Moisturizing Lotion, Dry Skin  Clairol Herbal Essence Moisturizing Lotion, Extra Dry Skin  Clairol Herbal Essence Moisturizing Lotion, Normal Skin  Curel Age Defying Therapeutic Moisturizing Lotion with Alpha Hydroxy  Curel Extreme Care Body Lotion  Curel Soothing Hands Moisturizing Hand Lotion  Curel Therapeutic Moisturizing Cream, Fragrance-Free  Curel Therapeutic Moisturizing Lotion, Fragrance-Free  Curel Therapeutic Moisturizing Lotion, Original Formula  Eucerin Daily Replenishing Lotion  Eucerin Dry Skin Therapy Plus Alpha Hydroxy Crme  Eucerin Dry Skin Therapy Plus Alpha Hydroxy Lotion  Eucerin Original Crme  Eucerin Original Lotion  Eucerin Plus Crme Eucerin Plus Lotion  Eucerin TriLipid Replenishing Lotion  Keri Anti-Bacterial Hand Lotion  Keri Deep Conditioning  Original Lotion Dry Skin Formula Softly Scented  Keri Deep Conditioning Original Lotion, Fragrance Free Sensitive Skin Formula  Keri Lotion Fast Absorbing Fragrance Free Sensitive Skin Formula  Keri Lotion Fast Absorbing Softly Scented Dry Skin Formula  Keri Original Lotion  Keri Skin Renewal Lotion Keri Silky Smooth Lotion  Keri Silky Smooth Sensitive Skin Lotion  Nivea Body Creamy Conditioning Oil  Nivea Body Extra Enriched Lotion  Nivea Body Original Lotion  Nivea Body Sheer Moisturizing Lotion Nivea Crme  Nivea Skin Firming Lotion  NutraDerm 30 Skin Lotion  NutraDerm Skin Lotion  NutraDerm Therapeutic Skin Cream  NutraDerm Therapeutic Skin Lotion  ProShield Protective Hand Cream  Provon moisturizing lotion   FAILURE TO FOLLOW THESE INSTRUCTIONS MAY RESULT IN THE CANCELLATION OF YOUR SURGERY  PATIENT SIGNATURE_________________________________  NURSE SIGNATURE__________________________________  ________________________________________________________________________        Rogelia Mire    An incentive spirometer is a tool that can help keep your lungs clear and active. This tool measures how well you are filling your lungs with each breath.  Taking long deep breaths may help reverse or decrease the chance of developing breathing (pulmonary) problems (especially infection) following: A long period of time when you are unable to move or be active. BEFORE THE PROCEDURE  If the spirometer includes an indicator to show your best effort, your nurse or respiratory therapist will set it to a desired goal. If possible, sit up straight or lean slightly forward. Try not to slouch. Hold the incentive spirometer in an upright position. INSTRUCTIONS FOR USE  Sit on the edge of your bed if possible, or sit up as far as you can in bed or on a chair. Hold the incentive spirometer in an upright position. Breathe out normally. Place the mouthpiece in your mouth and seal your  lips tightly around it. Breathe in slowly and as deeply as possible, raising the piston or the ball toward the top of the column. Hold your breath for 3-5 seconds or for as long as possible. Allow the piston or ball to fall to the bottom of the column. Remove the mouthpiece from your mouth and breathe out normally. Rest for a few seconds and repeat Steps 1 through 7 at least 10 times every 1-2 hours when you are awake. Take your time and take a few normal breaths between deep breaths. The spirometer may include an indicator to show your best effort. Use the indicator as a goal to work toward during each repetition. After each set of 10 deep breaths, practice coughing to be sure your lungs are clear. If you have an incision (the cut made at the time of surgery), support your incision when coughing by placing a pillow or rolled up towels firmly against it. Once you are able to get out of bed, walk around indoors and cough well. You may stop using the incentive spirometer when instructed by your caregiver.  RISKS AND COMPLICATIONS Take your time so you do not get dizzy or light-headed. If you are in pain, you may need to take or ask for pain medication before doing incentive spirometry. It is harder to take a deep breath if you are having pain. AFTER USE Rest and breathe slowly and easily. It can be helpful to keep track of a log of your progress. Your caregiver can provide you with a simple table to help with this. If you are using the spirometer at home, follow these instructions: SEEK MEDICAL CARE IF:  You are having difficultly using the spirometer. You have trouble using the spirometer as often as instructed. Your pain medication is not giving enough relief while using the spirometer. You develop fever of 100.5 F (38.1 C) or higher.                                                                                                    SEEK IMMEDIATE MEDICAL CARE IF:  You cough up bloody sputum  that had not been present before. You develop fever of 102 F (38.9 C) or greater. You develop worsening pain at or near the incision site. MAKE SURE YOU:  Understand these instructions. Will watch your  condition. Will get help right away if you are not doing well or get worse. Document Released: 09/08/2006 Document Revised: 07/21/2011 Document Reviewed: 11/09/2006 [facility] Patient Information 2014 [place], [place].       If you would like to see a video about joint replacement:   [email or web address]

## 2023-07-22 ENCOUNTER — Encounter (HOSPITAL_COMMUNITY)
Admission: RE | Admit: 2023-07-22 | Discharge: 2023-07-22 | Disposition: A | Discharge status: Home / Self Care | Payer: Medicare PPO | Source: Ambulatory Visit | Attending: Orthopedic Surgery | Admitting: Orthopedic Surgery

## 2023-07-22 ENCOUNTER — Other Ambulatory Visit: Payer: Self-pay

## 2023-07-22 ENCOUNTER — Encounter (HOSPITAL_COMMUNITY): Payer: Self-pay

## 2023-07-22 VITALS — BP 154/72 | HR 96 | Temp 98.0°F | Resp 18 | Ht 69.5 in | Wt 223.1 lb

## 2023-07-22 DIAGNOSIS — N400 Enlarged prostate without lower urinary tract symptoms: Secondary | ICD-10-CM | POA: Insufficient documentation

## 2023-07-22 DIAGNOSIS — M1711 Unilateral primary osteoarthritis, right knee: Secondary | ICD-10-CM | POA: Diagnosis not present

## 2023-07-22 DIAGNOSIS — E119 Type 2 diabetes mellitus without complications: Secondary | ICD-10-CM | POA: Diagnosis not present

## 2023-07-22 DIAGNOSIS — Z01818 Encounter for other preprocedural examination: Secondary | ICD-10-CM | POA: Diagnosis not present

## 2023-07-22 DIAGNOSIS — I10 Essential (primary) hypertension: Secondary | ICD-10-CM | POA: Diagnosis not present

## 2023-07-22 DIAGNOSIS — G988 Other disorders of nervous system: Secondary | ICD-10-CM

## 2023-07-22 DIAGNOSIS — Z8673 Personal history of transient ischemic attack (TIA), and cerebral infarction without residual deficits: Secondary | ICD-10-CM | POA: Diagnosis not present

## 2023-07-22 DIAGNOSIS — Z79891 Long term (current) use of opiate analgesic: Secondary | ICD-10-CM

## 2023-07-22 HISTORY — DX: Essential (primary) hypertension: I10

## 2023-07-22 HISTORY — DX: Anemia, unspecified: D64.9

## 2023-07-22 HISTORY — DX: Unspecified osteoarthritis, unspecified site: M19.90

## 2023-07-22 HISTORY — DX: Gastro-esophageal reflux disease without esophagitis: K21.9

## 2023-07-22 HISTORY — DX: Type 2 diabetes mellitus without complications: E11.9

## 2023-07-22 HISTORY — DX: Anxiety disorder, unspecified: F41.9

## 2023-07-22 LAB — COMPREHENSIVE METABOLIC PANEL
ALT: 24 U/L (ref 0–44)
AST: 24 U/L (ref 15–41)
Albumin: 3.7 g/dL (ref 3.5–5.0)
Alkaline Phosphatase: 59 U/L (ref 38–126)
Anion gap: 9 (ref 5–15)
BUN: 15 mg/dL (ref 8–23)
CO2: 28 mmol/L (ref 22–32)
Calcium: 9.1 mg/dL (ref 8.9–10.3)
Chloride: 100 mmol/L (ref 98–111)
Creatinine, Ser: 0.85 mg/dL (ref 0.61–1.24)
GFR, Estimated: >60 mL/min (ref >60)
Glucose, Bld: 214 mg/dL — ABNORMAL HIGH (ref 70–99)
Potassium: 4.2 mmol/L (ref 3.5–5.1)
Sodium: 137 mmol/L (ref 135–145)
Total Bilirubin: 1.3 mg/dL — ABNORMAL HIGH (ref 0.0–1.2)
Total Protein: 6.9 g/dL (ref 6.5–8.1)

## 2023-07-22 LAB — HEMOGLOBIN A1C
Hgb A1c MFr Bld: 8.1 % — ABNORMAL HIGH (ref 4.8–5.6)
Mean Plasma Glucose: 185.77 mg/dL

## 2023-07-22 LAB — CBC
HCT: 46 % (ref 39.0–52.0)
Hemoglobin: 14.7 g/dL (ref 13.0–17.0)
MCH: 32 pg (ref 26.0–34.0)
MCHC: 32 g/dL (ref 30.0–36.0)
MCV: 100.2 fL — ABNORMAL HIGH (ref 80.0–100.0)
Platelets: 190 10*3/uL (ref 150–400)
RBC: 4.59 MIL/uL (ref 4.22–5.81)
RDW: 12.5 % (ref 11.5–15.5)
WBC: 6.1 10*3/uL (ref 4.0–10.5)
nRBC: 0 % (ref 0.0–0.2)

## 2023-07-22 LAB — GLUCOSE, CAPILLARY: Glucose-Capillary: 203 mg/dL — ABNORMAL HIGH (ref 70–99)

## 2023-07-22 LAB — SURGICAL PCR SCREEN
MRSA, PCR: NEGATIVE
Staphylococcus aureus: NEGATIVE

## 2023-07-22 NOTE — Progress Notes (Signed)
 Hgb A1C today at PST appt, Notified Lori in Dr.Aluisio's office.   Previous A1C 7.5 on 04-20-23 per PCP clearance note scanned to Media on 05-11-23.

## 2023-07-24 NOTE — Progress Notes (Signed)
 Anesthesia Chart Review   Case: [id number] Date/Time: 08/03/23 0905   Procedure: ARTHROPLASTY, KNEE, TOTAL (Right: Knee)   Anesthesia type: Choice   Pre-op diagnosis: Osteoarthritis right knee   Location: [facility] ROOM 09 / [facility]   Surgeons: Ollen Gross, MD       DISCUSSION:83 y.o. former smoker with h/o HTN, stroke, DM II, BPH, right knee OA scheduled for above procedure 08/03/2023 with Dr. Ollen Gross.   Clearance from PCP received which states pt is moderate risk due to age, diabetes.  He can hold Plavix 5 days prior to procedure.   A1C 8.1 forwarded to Dr. Lequita Halt.  VS: BP (!) 154/72   Pulse 96   Temp 36.7 C (Oral)   Resp 18   Ht 5' 9.5" (1.765 m)   Wt 101.2 kg   SpO2 97%   BMI 32.48 kg/m   PROVIDERS: Mila Palmer, MD is PCP    LABS:  A1C forwarded to Dr. Lequita Halt (all labs ordered are listed, but only abnormal results are displayed)  Labs Reviewed  HEMOGLOBIN A1C - Abnormal; Notable for the following components:      Result Value   Hgb A1c MFr Bld 8.1 (*)    All other components within normal limits  COMPREHENSIVE METABOLIC PANEL - Abnormal; Notable for the following components:   Glucose, Bld 214 (*)    Total Bilirubin 1.3 (*)    All other components within normal limits  CBC - Abnormal; Notable for the following components:   MCV 100.2 (*)    All other components within normal limits  GLUCOSE, CAPILLARY - Abnormal; Notable for the following components:   Glucose-Capillary 203 (*)    All other components within normal limits  SURGICAL PCR SCREEN     IMAGES:   EKG:   CV: Echo 02/29/2016 - Left ventricle: The cavity size was normal. Wall thickness was    normal. Systolic function was normal. The estimated ejection    fraction was in the range of 60% to 65%. Wall motion was normal;    there were no regional wall motion abnormalities. Doppler    parameters are consistent with pseuonormal left ventricular    relaxation (grade 2 diastolic  dysfunction). The E/e&' ratio is    between 8-15, suggesting indeterminate LV filling pressure.  - Mitral valve: Mildly thickened leaflets . There was trivial    regurgitation.  - Left atrium: The atrium was normal in size.  - Inferior vena cava: The vessel was normal in size. The    respirophasic diameter changes were in the normal range (>= 50%),    consistent with normal central venous pressure.  Past Medical History:  Diagnosis Date   Anemia    Anxiety    Arthritis    BPH (benign prostatic hyperplasia)    self caths 1-2 times daily.   Diabetes mellitus without complication (HCC)    GERD (gastroesophageal reflux disease)    Hypertension    Neuromuscular disorder (HCC)    idiopathic,both feet,progressing to legs   Perforated ulcer (HCC) 1982   Stroke [facility])    states the Plavix is preventative due to the strong family history of stroke   Superficial basal cell carcinoma (BCC) 03/25/2021   Right Anterior Neck    Past Surgical History:  Procedure Laterality Date   ANKLE SURGERY Left    repair tendon   CARPAL TUNNEL RELEASE Right    CHOLECYSTECTOMY N/A 04/07/2016   Procedure: LAPAROSCOPIC CHOLECYSTECTOMY;  Surgeon: Rodman Pickle, MD;  Location:  [facility];  Service: General;  Laterality: N/A;   ESOPHAGOGASTRODUODENOSCOPY N/A 03/05/2016   Procedure: ESOPHAGOGASTRODUODENOSCOPY (EGD);  Surgeon: Carman Ching, MD;  Location: [facility];  Service: Endoscopy;  Laterality: N/A;   HERNIA REPAIR  2012   KNEE ARTHROSCOPY W/ DEBRIDEMENT Right 2013   TONSILLECTOMY     TRANSURETHRAL RESECTION OF PROSTATE  2006    MEDICATIONS:  ACCU-CHEK GUIDE test strip   acyclovir (ZOVIRAX) 400 MG tablet   ALPRAZolam (XANAX) 0.25 MG tablet   Azelastine HCl 137 MCG/SPRAY SOLN   b complex vitamins capsule   Cholecalciferol (VITAMIN D) 2000 units CAPS   clopidogrel (PLAVIX) 75 MG tablet   DULoxetine (CYMBALTA) 60 MG capsule   gabapentin (NEURONTIN) 800 MG tablet   naloxone  (NARCAN) nasal spray 4 mg/0.1 mL   omeprazole (PRILOSEC) 40 MG capsule   Oxycodone HCl 10 MG TABS   oxycodone-acetaminophen (PERCOCET) 2.5-325 MG tablet   prochlorperazine (COMPAZINE) 5 MG tablet   promethazine (PHENERGAN) 12.5 MG suppository   propranolol (INDERAL) 20 MG tablet   rosuvastatin (CRESTOR) 5 MG tablet   trimethoprim (TRIMPEX) 100 MG tablet   No current facility-administered medications for this encounter.   Jodell Cipro Ward, PA-C [facility] Pre-Surgical Testing [phone]

## 2023-07-27 DIAGNOSIS — E1169 Type 2 diabetes mellitus with other specified complication: Secondary | ICD-10-CM | POA: Diagnosis not present

## 2023-07-27 DIAGNOSIS — I10 Essential (primary) hypertension: Secondary | ICD-10-CM | POA: Diagnosis not present

## 2023-07-27 DIAGNOSIS — M1711 Unilateral primary osteoarthritis, right knee: Secondary | ICD-10-CM | POA: Diagnosis not present

## 2023-07-31 DIAGNOSIS — R338 Other retention of urine: Secondary | ICD-10-CM | POA: Diagnosis not present

## 2023-07-31 DIAGNOSIS — N3020 Other chronic cystitis without hematuria: Secondary | ICD-10-CM | POA: Diagnosis not present

## 2023-08-06 DIAGNOSIS — R339 Retention of urine, unspecified: Secondary | ICD-10-CM | POA: Diagnosis not present

## 2023-08-06 DIAGNOSIS — R3912 Poor urinary stream: Secondary | ICD-10-CM | POA: Diagnosis not present

## 2023-08-24 DIAGNOSIS — L821 Other seborrheic keratosis: Secondary | ICD-10-CM | POA: Diagnosis not present

## 2023-08-24 DIAGNOSIS — L814 Other melanin hyperpigmentation: Secondary | ICD-10-CM | POA: Diagnosis not present

## 2023-08-24 DIAGNOSIS — B353 Tinea pedis: Secondary | ICD-10-CM | POA: Diagnosis not present

## 2023-08-24 DIAGNOSIS — Z85828 Personal history of other malignant neoplasm of skin: Secondary | ICD-10-CM | POA: Diagnosis not present

## 2023-08-24 DIAGNOSIS — E1169 Type 2 diabetes mellitus with other specified complication: Secondary | ICD-10-CM | POA: Diagnosis not present

## 2023-08-24 DIAGNOSIS — L2989 Other pruritus: Secondary | ICD-10-CM | POA: Diagnosis not present

## 2023-08-24 DIAGNOSIS — Z789 Other specified health status: Secondary | ICD-10-CM | POA: Diagnosis not present

## 2023-08-24 DIAGNOSIS — L820 Inflamed seborrheic keratosis: Secondary | ICD-10-CM | POA: Diagnosis not present

## 2023-08-24 DIAGNOSIS — D225 Melanocytic nevi of trunk: Secondary | ICD-10-CM | POA: Diagnosis not present

## 2023-08-24 DIAGNOSIS — Z08 Encounter for follow-up examination after completed treatment for malignant neoplasm: Secondary | ICD-10-CM | POA: Diagnosis not present

## 2023-08-31 NOTE — H&P (Signed)
 TOTAL KNEE ADMISSION H&P  Patient is being admitted for right total knee arthroplasty.  Subjective:  Chief Complaint: Right knee pain.  HPI: David Li, 83 y.o. male has a history of pain and functional disability in the right knee due to arthritis and has failed non-surgical conservative treatments for greater than 12 weeks to include NSAID's and/or analgesics, corticosteriod injections, viscosupplementation injections, use of assistive devices, and activity modification. Onset of symptoms was gradual, starting several years ago with gradually worsening course since that time. The patient noted no past surgery on the right knee.  Patient currently rates pain in the right knee at 8 out of 10 with activity. Patient has night pain, worsening of pain with activity and weight bearing, and pain that interferes with activities of daily living. Patient has evidence of  bone-on-bone arthritis in the medial and patellofemoral compartments of the right knee  by imaging studies. There is no active infection.   Patient Active Problem List   Diagnosis Date Noted   Mixed axonal-demyelinating neuropathy 05/22/2022   Gait abnormality 02/01/2022   Allergic rhinitis 11/19/2021   Benign prostatic hyperplasia with lower urinary tract symptoms 11/19/2021   Cerebral infarction (HCC) 11/19/2021   Enlarged prostate 11/19/2021   Essential hypertension 11/19/2021   Fall 11/19/2021   Gastro-esophageal reflux disease without esophagitis 11/19/2021   Giant cell arteritis (HCC) 11/19/2021   Herpesviral infection, unspecified 11/19/2021   History of peptic ulcer 11/19/2021   History of transurethral resection of prostate 11/19/2021   Hyperglycemia 11/19/2021   Hypertensive heart disease without congestive heart failure 11/19/2021   Insomnia 11/19/2021   Lesion of ulnar nerve 11/19/2021   Major depression in full remission (HCC) 11/19/2021   Polymyalgia rheumatica (HCC) 11/19/2021   Protein calorie malnutrition  (HCC) 11/19/2021   Pure hypercholesterolemia 11/19/2021   Senile purpura (HCC) 11/19/2021   Tremor 11/19/2021   Type 2 diabetes mellitus with other specified complication (HCC) 11/19/2021   Lumbar post-laminectomy syndrome 07/18/2021   Acquired thrombophilia (HCC) 03/20/2021   Chronic pain syndrome 04/25/2020   Degeneration of lumbar intervertebral disc 04/25/2020   Carpal tunnel syndrome of right wrist 12/28/2017   Neuropathic pain 12/15/2017   History of stroke 12/01/2016   Cholelithiasis    Sepsis (HCC) 03/02/2016   Prolonged Q-T interval on ECG 03/02/2016   Hypokalemia 03/02/2016   Neurogenic bladder 03/02/2016   Pneumonia 02/28/2016   Acute respiratory failure with hypoxia (HCC) 02/28/2016   Nausea without vomiting 02/28/2016   Acute respiratory failure (HCC) 02/28/2016   Low back pain 05/28/2015   Paresthesia 05/28/2015   Tremor, essential 03/02/2014   Hereditary and idiopathic peripheral neuropathy 03/01/2013   Vasculitis (HCC) 03/01/2013    Past Medical History:  Diagnosis Date   Anemia    Anxiety    Arthritis    BPH (benign prostatic hyperplasia)    self caths 1-2 times daily.   Diabetes mellitus without complication (HCC)    GERD (gastroesophageal reflux disease)    Hypertension    Neuromuscular disorder (HCC)    idiopathic,both feet,progressing to legs   Perforated ulcer (HCC) 1982   Stroke [facility])    states the Plavix  is preventative due to the strong family history of stroke   Superficial basal cell carcinoma (BCC) 03/25/2021   Right Anterior Neck    Past Surgical History:  Procedure Laterality Date   ANKLE SURGERY Left    repair tendon   CARPAL TUNNEL RELEASE Right    CHOLECYSTECTOMY N/A 04/07/2016   Procedure: LAPAROSCOPIC CHOLECYSTECTOMY;  Surgeon: Derral Flick, MD;  Location: [facility];  Service: General;  Laterality: N/A;   ESOPHAGOGASTRODUODENOSCOPY N/A 03/05/2016   Procedure: ESOPHAGOGASTRODUODENOSCOPY (EGD);  Surgeon:  Jolinda Necessary, MD;  Location: [facility];  Service: Endoscopy;  Laterality: N/A;   HERNIA REPAIR  2012   KNEE ARTHROSCOPY W/ DEBRIDEMENT Right 2013   TONSILLECTOMY     TRANSURETHRAL RESECTION OF PROSTATE  2006    Prior to Admission medications   Medication Sig Start Date End Date Taking? Authorizing Provider  acyclovir (ZOVIRAX) 400 MG tablet Take 400 mg by mouth daily as needed (cold sores). Outbreaks    Yes [provider]  ALPRAZolam (XANAX) 0.25 MG tablet Take 0.25 mg by mouth daily as needed for anxiety.   Yes [provider]  Azelastine HCl 137 MCG/SPRAY SOLN Place 2 sprays into both nostrils daily as needed (allergies). 05/02/21  Yes [provider]  b complex vitamins capsule Take 1 capsule by mouth daily.   Yes [provider]  Cholecalciferol  (VITAMIN D ) 2000 units CAPS Take 2,000 Units by mouth daily.   Yes [provider]  clopidogrel  (PLAVIX ) 75 MG tablet TAKE 1 TABLET BY MOUTH EVERY DAY 02/09/23  Yes Wess Hammed, NP  DULoxetine  (CYMBALTA ) 60 MG capsule TAKE 1 CAPSULE BY MOUTH EVERY DAY 11/27/22  Yes Phebe Brasil, MD  gabapentin  (NEURONTIN ) 800 MG tablet Take 1 tablet (800 mg total) by mouth 3 (three) times daily. 11/27/22  Yes Phebe Brasil, MD  naloxone [facility]) nasal spray 4 mg/0.1 mL Place 0.4 mg into the nose once.   Yes [provider]  Oxycodone  HCl 10 MG TABS Take 10 mg by mouth 4 (four) times daily as needed for pain.   Yes [provider]  prochlorperazine  (COMPAZINE ) 5 MG tablet Take 2 tablets (10 mg total) by mouth every 6 (six) hours as needed for nausea or vomiting. 01/26/18  Yes Upstill, Clovis Dar, PA-C  propranolol  (INDERAL ) 20 MG tablet Take 1 tablet (20 mg total) by mouth 2 (two) times daily. 11/27/22  Yes Phebe Brasil, MD  rosuvastatin (CRESTOR) 5 MG tablet Take 5 mg by mouth every Monday, Wednesday, and Friday.   Yes [provider]  trimethoprim (TRIMPEX) 100 MG tablet Take 100 mg by mouth daily.  11/22/16  Yes [provider]  ACCU-CHEK GUIDE test strip  04/27/21   [provider]  omeprazole  (PRILOSEC) 40 MG capsule Take 1 capsule (40 mg total) by mouth 2 (two) times daily before a meal. Patient not taking: Reported on 07/20/2023 03/05/16   Rayfield Cairo, MD  oxycodone -acetaminophen  (PERCOCET) 2.5-325 MG tablet Take 1 tablet by mouth every 6 (six) hours as needed for pain (4mg  tablets).    [provider]  promethazine  (PHENERGAN ) 12.5 MG suppository Place 1 suppository (12.5 mg total) rectally every 6 (six) hours as needed for nausea, vomiting or refractory nausea / vomiting. Patient not taking: Reported on 07/20/2023 01/26/18   Mandy Second, PA-C    Allergies  Allergen Reactions   Amoxicillin  Nausea And Vomiting   Macrobid  [Nitrofurantoin Macrocrystal] Nausea And Vomiting    Severe N&V   Other     Muscle relaxant medication-unsure of the name    Social History   Socioeconomic History   Marital status: Married    Spouse name: Ottie Blonder   Number of children: 2   Years of education: college   Highest education level: Not on file  Occupational History    Comment: retired  Tobacco Use  Smoking status: Former   Smokeless tobacco: Never  Vaping Use   Vaping status: Never Used  Substance and Sexual Activity   Alcohol use: Yes    Alcohol/week: 1.0 standard drink of alcohol    Types: 1 Glasses of wine per week    Comment: daily   Drug use: No   Sexual activity: Not Currently  Other Topics Concern   Not on file  Social History Narrative   Patient is retired, Married for 26 years Ottie Blonder).   EducationDesigner, jewellery.   Right handed.   Caffeine- five cups daily only tea         Social Drivers of Corporate investment banker Strain: Not on file  Food Insecurity: Not on file  Transportation Needs: Not on file  Physical Activity: Not on file  Stress: Not on file  Social Connections: Unknown (09/18/2022)   Received from [facility]   Social Network    Social Network: Not on file  Intimate Partner Violence: Not At Risk (09/18/2022)   Received from [facility]   HITS    Over the last 12 months how often did your partner physically hurt you?: Never    Over the last 12 months how often did your partner insult you or talk down to you?: Never    Over the last 12 months how often did your partner threaten you with physical harm?: Never    Over the last 12 months how often did your partner scream or curse at you?: Never    Tobacco Use: Medium Risk (07/22/2023)   Patient History    Smoking Tobacco Use: Former    Smokeless Tobacco Use: Never    Passive Exposure: Not on file   Social History   Substance and Sexual Activity  Alcohol Use Yes   Alcohol/week: 1.0 standard drink of alcohol   Types: 1 Glasses of wine per week   Comment: daily    Family History  Problem Relation Age of Onset   Stroke Mother    Diabetes Mother    Stroke Father    Diabetes Father    Diabetes Sister    Diabetes Brother     ROS  Objective:  Physical Exam: Well nourished and well developed.  General: Alert and oriented x3, cooperative and pleasant, no acute distress.  Head: normocephalic, atraumatic, neck supple.  Eyes: EOMI. Abdomen: non-tender to palpation and soft, normoactive bowel sounds. Musculoskeletal: - Right knee shows a moderate-sized effusion. Range of motion is 5-125 degrees with crepitus on range of motion. Tender medially. No lateral tenderness or instability noted. Gait pattern is antalgic on the right.  - Left knee: No effusion. Range of motion is 0-125 degrees. Slight tenderness medially. No lateral tenderness or instability noted. Calves soft and nontender. Motor function intact in LE. Strength 5/5 LE bilaterally. Neuro: Distal pulses 2+. Sensation to light touch intact in LE.    Vital signs in last 24 hours: BP: ()/()  Arterial Line BP: ()/()   Imaging Review Plain radiographs  demonstrate severe degenerative joint disease of the right knee. The overall alignment is neutral. The bone quality appears to be adequate for age and reported activity level.  Assessment/Plan:  End stage arthritis, right knee   The patient history, physical examination, clinical judgment of the provider and imaging studies are consistent with end stage degenerative joint disease of the right knee and total knee arthroplasty is deemed medically necessary. The treatment options including medical management, injection therapy  arthroscopy and arthroplasty were discussed at length. The risks and benefits of total knee arthroplasty were presented and reviewed. The risks due to aseptic loosening, infection, stiffness, patella tracking problems, thromboembolic complications and other imponderables were discussed. The patient acknowledged the explanation, agreed to proceed with the plan and consent was signed. Patient is being admitted for inpatient treatment for surgery, pain control, PT, OT, prophylactic antibiotics, VTE prophylaxis, progressive ambulation and ADLs and discharge planning. The patient is planning to be discharged  home .  Patient's anticipated LOS is less than 2 midnights, meeting these requirements: - Lives within 1 hour of care - Has a competent adult at home to recover with post-op - NO history of             - Coronary Artery Disease             - Heart failure             - Heart attack             - DVT/VTE             - Cardiac arrhythmia             - Respiratory Failure/COPD             - Renal failure             - Anemia             - Advanced Liver disease  Therapy Plans: EO Disposition: Home with Wife Planned DVT Prophylaxis: Plavix  + Aspirin DME Needed: RW PCP: Olin Bertin, MD (clearance received) TXA: IV Allergies: amoxicillin  (nausea), macrobid Anesthesia Concerns: None BMI: 32.3 Last HgbA1c: 8.1% - 07/2023 (rechecking prior to surgery)  [facility]: [facility] (deliver to room)  Other: -Surgery previously cancelled due to high A1c. Patient aware needs to be below 7.8% to proceed. Will recheck at pre-op appt -Takes oxycodone  10 mg QID - discussed dilaudid for breakthrough pain -Per MD, stop Plavix  5 days prior - primarily taking the plavix  as preventative for stroke. Did have possibly one small TIA a few years ago. -Prefers cyclobenzaprine - thinks he may have had a past adverse effect to methocarbamol   - Patient was instructed on what medications to stop prior to surgery. - Follow-up visit in 2 weeks with Dr. France Ina - Begin physical therapy following surgery - Pre-operative lab work as pre-surgical testing - Prescriptions will be provided in hospital at time of discharge  R. Brinton Canavan, PA-C Orthopedic Surgery [facility] [place]

## 2023-09-01 NOTE — Progress Notes (Signed)
 Patient was originally scheduled for this Right TKA on August 03, 2023 but his A1c was 8.1, which was too high ( needs to be < 7.8). Surgery has been rescheduled for 09-07-2023. PST interview was repeated on 09-02-23 and labs were updated.    COVID Vaccine received:  []  No [x]  Yes Date of any COVID positive Test in last 90 days:  None   PCP - Olin Bertin, MD  Medical clearance scanned in Media 05-11-23   [phone] (Work)  Cardiologist - none Neurology- Phebe Brasil, MD  Pain Mgmt- Adelaide Adjutant, MD, Griffith Led, NP (LOV 07-08-23)   Chest x-ray - 06-04-2016  2v  Epic EKG - 07-22-2023  Epic Stress Test -  ECHO - 02-29-2016  Epic Cardiac Cath -    PCR screen: [x]  Ordered & Completed []   No Order but Needs PROFEND     []   N/A for this surgery   Surgery Plan:  []  Ambulatory   [x]  Outpatient in bed  []  Admit Anesthesia:    []  General  []  Spinal  [x]   Choice []   MAC   Pacemaker / ICD device [x]  No []  Yes   Spinal Cord Stimulator:[x]  No []  Yes       History of Sleep Apnea? [x]  No []  Yes   CPAP used?- [x]  No []  Yes     Does the patient monitor blood sugar?   []  N/A   []  No [x]  Yes  Patient has: []  NO Hx DM   []  Pre-DM   []  DM1  [x]   DM2 Last A1c was: 8.0 on  07-22-2023 Does patient have a [employer] or Dexcom? []  No []  Yes   Fasting Blood Sugar Ranges- 130-155 Checks Blood Sugar __2-3 times a week TAKES NO DIABETIC MEDICATION   Blood Thinner / Instructions: Plavix   ?????Hold x 5 days  Last:09-01-23 Aspirin Instructions: none   ERAS Protocol Ordered: []  No  [x]  Yes PRE-SURGERY []  ENSURE  [x]  G2 Patient is to be NPO after: 0615   Dental hx: []  Dentures:  [x]  N/A      []  Bridge or Partial:                   []  Loose or Damaged teeth:    Comments: Patient was given the 5 CHG shower / bath instructions for TKA surgery along with 2 bottles of the CHG soap. Patient will start this on: 09-03-23. All questions were asked and answered, Patient voiced understanding of this process.     Activity level: Patient is unable to climb a flight of stairs without difficulty; [x]  No CP  [x]  No SOB, but would have leg and back pain  Patient can / can not perform ADLs without assistance.    Anesthesia review: DM2- No medication, HTN, Mixed axonal demyelinating neuropathy, essential tremor, Hx CVA, CPS- sees Dr. Rexanne Catalina, GERD,    Patient denies shortness of breath, fever, cough and chest pain at PAT appointment.   Patient verbalized understanding and agreement to the Pre-Surgical Instructions that were given to them at this PAT appointment. Patient was also educated of the need to review these PAT instructions again prior to his surgery.I reviewed the appropriate phone numbers to call if they have any and questions or concerns.

## 2023-09-01 NOTE — Patient Instructions (Addendum)
 SURGICAL WAITING ROOM VISITATION Patients having surgery or a procedure may have no more than 2 support people in the waiting area - these visitors may rotate in the visitor waiting room.   Due to an increase in RSV and influenza rates and associated hospitalizations, children ages 35 and under may not visit patients in [facility] hospitals. If the patient needs to stay at the hospital during part of their recovery, the visitor guidelines for inpatient rooms apply.   PRE-OP VISITATION  Pre-op nurse will coordinate an appropriate time for 1 support person to accompany the patient in pre-op.  This support person may not rotate.  This visitor will be contacted when the time is appropriate for the visitor to come back in the pre-op area.   Please refer to the [facility] website for the visitor guidelines for Inpatients (after your surgery is over and you are in a regular room).   You are not required to quarantine at this time prior to your surgery. However, you must do this: Hand Hygiene often Do NOT share personal items Notify your provider if you are in close contact with someone who has COVID or you develop fever 100.4 or greater, new onset of sneezing, cough, sore throat, shortness of breath or body aches.  If you test positive for Covid or have been in contact with anyone that has tested positive in the last 10 days please notify you surgeon.     Your procedure is scheduled on:  [facility]  August 03, 2023    Report to [facility] Main Entrance: [address] entrance where the [employer] is available.    Report to admitting at: 10:00   AM   Call this number if you have any questions or problems the morning of surgery [phone]   Do not eat food after Midnight the night prior to your surgery/procedure.   After Midnight you may have the following liquids until 09:30  AM  DAY OF SURGERY   Clear Liquid Diet Water Black Coffee (sugar ok, NO MILK/CREAM OR CREAMERS)  Tea (sugar  ok, NO MILK/CREAM OR CREAMERS) regular and decaf                             Plain Jell-O  with no fruit (NO RED)                                           Fruit ices (not with fruit pulp, NO RED)                                     Popsicles (NO RED)                                                                  Juice: NO CITRUS JUICES: only apple, WHITE grape, WHITE cranberry Sports drinks like Gatorade or Powerade (NO RED)                         The day  of surgery:  Drink ONE (1) Pre-Surgery G2 at  09:30 AM the morning of surgery. Drink in one sitting. Do not sip.  This drink was given to you during your hospital pre-op appointment visit. Nothing else to drink after completing the Pre-Surgery G2 : No candy, chewing gum or throat lozenges.     FOLLOW ANY ADDITIONAL PRE OP INSTRUCTIONS YOU RECEIVED FROM YOUR SURGEON'S OFFICE!!!    Oral Hygiene is also important to reduce your risk of infection.        Remember - BRUSH YOUR TEETH THE MORNING OF SURGERY WITH YOUR REGULAR TOOTHPASTE   Do NOT smoke after Midnight the night before surgery.   STOP TAKING all Vitamins, Herbs and supplements 1 week before your surgery.    PLAVIX - Stop taking 5 days before your surgery. Last dose will be taken on Tuesday  September 01, 2023  JARDIANCE-  Last dose will be taken on 09-03-23   Take ONLY these medicines the morning of surgery with A SIP OF WATER: Propranolol , duloxetine , gabapentin . If needed, you may take Alprazolam, Oxycodone  HCL and Percocet.  You may use your Nasal spray if needed.,                     You may not have any metal on your body including  jewelry, and body piercing   Do not wear  lotions, powders,  cologne, or deodorant   Men may shave face and neck.   Contacts, Hearing Aids, dentures or bridgework may not be worn into surgery. DENTURES WILL BE REMOVED PRIOR TO SURGERY PLEASE DO NOT APPLY "Poly grip" OR ADHESIVES!!!   You may bring a small overnight bag with you on the day  of surgery, only pack items that are not valuable. [facility] IS NOT RESPONSIBLE   FOR VALUABLES THAT ARE LOST OR STOLEN.    Do not bring your home medications to the hospital. The Pharmacy will dispense medications listed on your medication list to you during your admission in the Hospital.   Special Instructions: Bring a copy of your healthcare power of attorney and living will documents the day of surgery, if you wish to have them scanned into your [facility] Records- EPIC   Please read over the following fact sheets you were given: IF YOU HAVE QUESTIONS ABOUT YOUR PRE-OP INSTRUCTIONS, PLEASE CALL [phone].        Pre-operative 5 CHG Bath Instructions    You can play a key role in reducing the risk of infection after surgery. Your skin needs to be as free of germs as possible. You can reduce the number of germs on your skin by washing with CHG (chlorhexidine  gluconate) soap before surgery. CHG is an antiseptic soap that kills germs and continues to kill germs even after washing.    DO NOT use if you have an allergy to chlorhexidine /CHG or antibacterial soaps. If your skin becomes reddened or irritated, stop using the CHG and notify one of our RNs at [phone]   Please shower with the CHG soap starting 4 days before surgery using the following schedule: START SHOWERS ON   THURSDAY  September 03, 2023  Please keep in mind the following:  DO NOT shave, including legs and underarms, starting the day of your first shower.   You may shave your face at any point before/day of surgery.    Place clean sheets on your bed the day you start using CHG soap. Use a clean washcloth (not used since being washed) for each shower. DO NOT sleep with pets once you start using the CHG.    CHG Shower Instructions:  If you  choose to wash your hair and private area, wash first with your normal shampoo/soap.  After you use shampoo/soap, rinse your hair and body thoroughly to remove shampoo/soap residue.  Turn the water OFF and apply about 3 tablespoons (45 ml) of CHG soap to a CLEAN washcloth.  Apply CHG soap ONLY FROM YOUR NECK DOWN TO YOUR TOES (washing for 3-5 minutes)  DO NOT use CHG soap on face, private areas, open wounds, or sores.  Pay special attention to the area where your surgery is being performed.  If you are having back surgery, having someone wash your back for you may be helpful.   Wait 2 minutes after CHG soap is applied, then you may rinse off the CHG soap.  Pat dry with a clean towel  Put on clean clothes/pajamas   If you choose to wear lotion, please use ONLY the CHG-compatible lotions on the back of this paper.     Additional instructions for the day of surgery: DO NOT APPLY any lotions, deodorants, cologne, or perfumes.   Put on clean/comfortable clothes.  Brush your teeth.  Ask your nurse before applying any prescription medications to the skin.          CHG Compatible Lotions    Aveeno Moisturizing lotion  Cetaphil Moisturizing Cream  Cetaphil Moisturizing Lotion  Clairol Herbal Essence Moisturizing Lotion, Dry Skin  Clairol Herbal Essence Moisturizing Lotion, Extra Dry Skin  Clairol Herbal Essence Moisturizing Lotion, Normal Skin  Curel Age Defying Therapeutic Moisturizing Lotion with Alpha Hydroxy  Curel Extreme Care Body Lotion  Curel Soothing Hands Moisturizing Hand Lotion  Curel Therapeutic Moisturizing Cream, Fragrance-Free  Curel Therapeutic Moisturizing Lotion, Fragrance-Free  Curel Therapeutic Moisturizing Lotion, Original Formula  Eucerin Daily Replenishing Lotion  Eucerin Dry Skin Therapy Plus Alpha Hydroxy Crme  Eucerin Dry Skin Therapy Plus Alpha Hydroxy Lotion  Eucerin Original Crme  Eucerin Original Lotion  Eucerin Plus Crme Eucerin Plus Lotion   Eucerin TriLipid Replenishing Lotion  Keri Anti-Bacterial Hand Lotion  Keri Deep Conditioning Original Lotion Dry Skin Formula Softly Scented  Keri Deep Conditioning Original Lotion, Fragrance Free Sensitive Skin Formula  Keri Lotion Fast Absorbing Fragrance Free Sensitive Skin Formula  Keri Lotion Fast Absorbing Softly Scented Dry Skin Formula  Keri Original Lotion  Keri Skin Renewal Lotion Keri Silky Smooth Lotion  Keri Silky Smooth Sensitive Skin Lotion  Nivea Body Creamy Conditioning Oil  Nivea Body Extra Enriched Lotion  Nivea Body Original Lotion  Nivea Body Sheer Moisturizing Lotion Nivea Crme  Nivea Skin Firming Lotion  NutraDerm 30 Skin Lotion  NutraDerm Skin Lotion  NutraDerm Therapeutic Skin Cream  NutraDerm Therapeutic Skin Lotion  ProShield Protective Hand Cream  Provon moisturizing lotion     FAILURE TO FOLLOW THESE INSTRUCTIONS MAY RESULT IN THE CANCELLATION OF YOUR SURGERY   PATIENT SIGNATURE_________________________________   NURSE SIGNATURE__________________________________   ________________________________________________________________________              Benjamen Brand      An incentive spirometer is a tool that  can help keep your lungs clear and active. This tool measures how well you are filling your lungs with each breath. Taking long deep breaths may help reverse or decrease the chance of developing breathing (pulmonary) problems (especially infection) following: A long period of time when you are unable to move or be active. BEFORE THE PROCEDURE  If the spirometer includes an indicator to show your best effort, your nurse or respiratory therapist will set it to a desired goal. If possible, sit up straight or lean slightly forward. Try not to slouch. Hold the incentive spirometer in an upright position. INSTRUCTIONS FOR USE  Sit on the edge of your bed if possible, or sit up as far as you can in bed or on a chair. Hold the incentive  spirometer in an upright position. Breathe out normally. Place the mouthpiece in your mouth and seal your lips tightly around it. Breathe in slowly and as deeply as possible, raising the piston or the ball toward the top of the column. Hold your breath for 3-5 seconds or for as long as possible. Allow the piston or ball to fall to the bottom of the column. Remove the mouthpiece from your mouth and breathe out normally. Rest for a few seconds and repeat Steps 1 through 7 at least 10 times every 1-2 hours when you are awake. Take your time and take a few normal breaths between deep breaths. The spirometer may include an indicator to show your best effort. Use the indicator as a goal to work toward during each repetition. After each set of 10 deep breaths, practice coughing to be sure your lungs are clear. If you have an incision (the cut made at the time of surgery), support your incision when coughing by placing a pillow or rolled up towels firmly against it. Once you are able to get out of bed, walk around indoors and cough well. You may stop using the incentive spirometer when instructed by your caregiver.  RISKS AND COMPLICATIONS Take your time so you do not get dizzy or light-headed. If you are in pain, you may need to take or ask for pain medication before doing incentive spirometry. It is harder to take a deep breath if you are having pain. AFTER USE Rest and breathe slowly and easily. It can be helpful to keep track of a log of your progress. Your caregiver can provide you with a simple table to help with this. If you are using the spirometer at home, follow these instructions: SEEK MEDICAL CARE IF:  You are having difficultly using the spirometer. You have trouble using the spirometer as often as instructed. Your pain medication is not giving enough relief while using the spirometer. You develop fever of 100.5 F (38.1 C) or higher.                                                                                                     SEEK IMMEDIATE MEDICAL CARE IF:  You cough up bloody sputum that had not been present before. You develop fever of 102 F (38.9 C) or  greater. You develop worsening pain at or near the incision site. MAKE SURE YOU:  Understand these instructions. Will watch your condition. Will get help right away if you are not doing well or get worse. Document Released: 09/08/2006 Document Revised: 07/21/2011 Document Reviewed: 11/09/2006 [facility] Patient Information 2014 [place], [place].             If you would like to see a video about joint replacement:    [email or web address]

## 2023-09-02 ENCOUNTER — Encounter (HOSPITAL_COMMUNITY)
Admission: RE | Admit: 2023-09-02 | Discharge: 2023-09-02 | Disposition: A | Discharge status: Home / Self Care | Source: Ambulatory Visit | Attending: Orthopedic Surgery | Admitting: Orthopedic Surgery

## 2023-09-02 VITALS — BP 126/75 | HR 56 | Temp 98.5°F | Resp 18 | Ht 69.5 in | Wt 215.0 lb

## 2023-09-02 DIAGNOSIS — E1169 Type 2 diabetes mellitus with other specified complication: Secondary | ICD-10-CM | POA: Diagnosis not present

## 2023-09-02 DIAGNOSIS — Z01812 Encounter for preprocedural laboratory examination: Secondary | ICD-10-CM | POA: Insufficient documentation

## 2023-09-02 DIAGNOSIS — Z01818 Encounter for other preprocedural examination: Secondary | ICD-10-CM

## 2023-09-02 LAB — COMPREHENSIVE METABOLIC PANEL WITH GFR
ALT: 26 U/L (ref 0–44)
AST: 26 U/L (ref 15–41)
Albumin: 3.8 g/dL (ref 3.5–5.0)
Alkaline Phosphatase: 55 U/L (ref 38–126)
Anion gap: 8 (ref 5–15)
BUN: 15 mg/dL (ref 8–23)
CO2: 27 mmol/L (ref 22–32)
Calcium: 9.2 mg/dL (ref 8.9–10.3)
Chloride: 101 mmol/L (ref 98–111)
Creatinine, Ser: 1 mg/dL (ref 0.61–1.24)
GFR, Estimated: >60 mL/min (ref >60)
Glucose, Bld: 98 mg/dL (ref 70–99)
Potassium: 4.9 mmol/L (ref 3.5–5.1)
Sodium: 136 mmol/L (ref 135–145)
Total Bilirubin: 1.5 mg/dL — ABNORMAL HIGH (ref 0.0–1.2)
Total Protein: 7 g/dL (ref 6.5–8.1)

## 2023-09-02 LAB — CBC
HCT: 49 % (ref 39.0–52.0)
Hemoglobin: 15.4 g/dL (ref 13.0–17.0)
MCH: 31.4 pg (ref 26.0–34.0)
MCHC: 31.4 g/dL (ref 30.0–36.0)
MCV: 100 fL (ref 80.0–100.0)
Platelets: 235 10*3/uL (ref 150–400)
RBC: 4.9 MIL/uL (ref 4.22–5.81)
RDW: 12.8 % (ref 11.5–15.5)
WBC: 8.7 10*3/uL (ref 4.0–10.5)
nRBC: 0 % (ref 0.0–0.2)

## 2023-09-02 LAB — SURGICAL PCR SCREEN
MRSA, PCR: NEGATIVE
Staphylococcus aureus: NEGATIVE

## 2023-09-02 LAB — HEMOGLOBIN A1C
Hgb A1c MFr Bld: 6.6 % — ABNORMAL HIGH (ref 4.8–5.6)
Mean Plasma Glucose: 142.72 mg/dL

## 2023-09-03 NOTE — Anesthesia Preprocedure Evaluation (Addendum)
 Anesthesia Evaluation  Patient identified by MRN, date of birth, ID band Patient awake    Reviewed: Allergy & Precautions, H&P , NPO status , Patient's Chart, lab work & pertinent test results  Airway Mallampati: II  TM Distance: >3 FB Neck ROM: Full    Dental no notable dental hx. (+) Teeth Intact, Dental Advisory Given   Pulmonary former smoker   Pulmonary exam normal breath sounds clear to auscultation       Cardiovascular hypertension, Pt. on medications and Pt. on home beta blockers  Rhythm:Regular Rate:Normal     Neuro/Psych   Anxiety Depression    CVA, No Residual Symptoms    GI/Hepatic Neg liver ROS, PUD,GERD  ,,  Endo/Other  diabetes, Type 2, Oral Hypoglycemic Agents    Renal/GU negative Renal ROS  negative genitourinary   Musculoskeletal  (+) Arthritis , Osteoarthritis,    Abdominal   Peds  Hematology  (+) Blood dyscrasia, anemia   Anesthesia Other Findings   Reproductive/Obstetrics negative OB ROS                             Anesthesia Physical Anesthesia Plan  ASA: 3  Anesthesia Plan: Spinal   Post-op Pain Management: Regional block* and Ofirmev  IV (intra-op)*   Induction: Intravenous  PONV Risk Score and Plan: 2 and Ondansetron , Dexamethasone  and Propofol  infusion  Airway Management Planned: Simple Face Mask and Natural Airway  Additional Equipment:   Intra-op Plan:   Post-operative Plan:   Informed Consent: I have reviewed the patients History and Physical, chart, labs and discussed the procedure including the risks, benefits and alternatives for the proposed anesthesia with the patient or authorized representative who has indicated his/her understanding and acceptance.     Dental advisory given  Plan Discussed with: CRNA  Anesthesia Plan Comments: (See PAT note from 3/12)        Anesthesia Quick Evaluation

## 2023-09-07 ENCOUNTER — Ambulatory Visit (HOSPITAL_COMMUNITY): Payer: Self-pay | Admitting: Physician Assistant

## 2023-09-07 ENCOUNTER — Encounter (HOSPITAL_COMMUNITY)
Admission: RE | Disposition: A | Discharge status: Home / Self Care | Payer: Self-pay | Source: Home / Self Care | Attending: Orthopedic Surgery

## 2023-09-07 ENCOUNTER — Ambulatory Visit (HOSPITAL_COMMUNITY): Admitting: Anesthesiology

## 2023-09-07 ENCOUNTER — Encounter (HOSPITAL_COMMUNITY): Payer: Self-pay | Admitting: Orthopedic Surgery

## 2023-09-07 ENCOUNTER — Other Ambulatory Visit: Payer: Self-pay

## 2023-09-07 ENCOUNTER — Observation Stay (HOSPITAL_COMMUNITY)
Admission: RE | Admit: 2023-09-07 | Discharge: 2023-09-08 | Disposition: A | Discharge status: Home / Self Care | Payer: Medicare PPO | Attending: Orthopedic Surgery | Admitting: Orthopedic Surgery

## 2023-09-07 DIAGNOSIS — Z01818 Encounter for other preprocedural examination: Secondary | ICD-10-CM

## 2023-09-07 DIAGNOSIS — M1711 Unilateral primary osteoarthritis, right knee: Secondary | ICD-10-CM | POA: Diagnosis not present

## 2023-09-07 DIAGNOSIS — E119 Type 2 diabetes mellitus without complications: Secondary | ICD-10-CM | POA: Diagnosis not present

## 2023-09-07 DIAGNOSIS — I119 Hypertensive heart disease without heart failure: Secondary | ICD-10-CM | POA: Diagnosis not present

## 2023-09-07 DIAGNOSIS — Z79899 Other long term (current) drug therapy: Secondary | ICD-10-CM | POA: Insufficient documentation

## 2023-09-07 DIAGNOSIS — I10 Essential (primary) hypertension: Secondary | ICD-10-CM | POA: Diagnosis not present

## 2023-09-07 DIAGNOSIS — Z7902 Long term (current) use of antithrombotics/antiplatelets: Secondary | ICD-10-CM | POA: Diagnosis not present

## 2023-09-07 DIAGNOSIS — Z8673 Personal history of transient ischemic attack (TIA), and cerebral infarction without residual deficits: Secondary | ICD-10-CM | POA: Insufficient documentation

## 2023-09-07 DIAGNOSIS — Z7984 Long term (current) use of oral hypoglycemic drugs: Secondary | ICD-10-CM | POA: Diagnosis not present

## 2023-09-07 DIAGNOSIS — M179 Osteoarthritis of knee, unspecified: Principal | ICD-10-CM | POA: Diagnosis present

## 2023-09-07 DIAGNOSIS — Z87891 Personal history of nicotine dependence: Secondary | ICD-10-CM | POA: Insufficient documentation

## 2023-09-07 DIAGNOSIS — G8918 Other acute postprocedural pain: Secondary | ICD-10-CM | POA: Diagnosis not present

## 2023-09-07 HISTORY — PX: TOTAL KNEE ARTHROPLASTY: SHX125

## 2023-09-07 LAB — GLUCOSE, CAPILLARY
Glucose-Capillary: 162 mg/dL — ABNORMAL HIGH (ref 70–99)
Glucose-Capillary: 139 mg/dL — ABNORMAL HIGH (ref 70–99)
Glucose-Capillary: 163 mg/dL — ABNORMAL HIGH (ref 70–99)
Glucose-Capillary: 279 mg/dL — ABNORMAL HIGH (ref 70–99)

## 2023-09-07 SURGERY — ARTHROPLASTY, KNEE, TOTAL
Anesthesia: Spinal | Site: Knee | Laterality: Right

## 2023-09-07 MED ORDER — ACETAMINOPHEN 325 MG PO TABS: 325.0000 mg | ORAL_TABLET | Freq: Four times a day (QID) | ORAL | Status: DC | PRN

## 2023-09-07 MED ORDER — GLYCOPYRROLATE 0.2 MG/ML IJ SOLN
INTRAMUSCULAR | Status: DC | PRN
  Administered 2023-09-07: .2 mg via INTRAVENOUS

## 2023-09-07 MED ORDER — PHENOL 1.4 % MT LIQD: 1.0000 | OROMUCOSAL | Status: DC | PRN

## 2023-09-07 MED ORDER — DEXAMETHASONE SODIUM PHOSPHATE 10 MG/ML IJ SOLN
8.0000 mg | Freq: Once | INTRAMUSCULAR | Status: AC
  Administered 2023-09-07: 8 mg via INTRAVENOUS

## 2023-09-07 MED ORDER — LACTATED RINGERS IV SOLN: INTRAVENOUS | Status: DC

## 2023-09-07 MED ORDER — ALBUMIN HUMAN 5 % IV SOLN: INTRAVENOUS | Status: DC | PRN

## 2023-09-07 MED ORDER — PROPOFOL 500 MG/50ML IV EMUL
INTRAVENOUS | Status: DC | PRN
  Administered 2023-09-07: 100 ug/kg/min via INTRAVENOUS

## 2023-09-07 MED ORDER — DULOXETINE HCL 60 MG PO CPEP
60.0000 mg | ORAL_CAPSULE | Freq: Every day | ORAL | Status: DC
  Administered 2023-09-08: 60 mg via ORAL
  Filled 2023-09-07: qty 1

## 2023-09-07 MED ORDER — EPHEDRINE SULFATE-NACL 50-0.9 MG/10ML-% IV SOSY
PREFILLED_SYRINGE | INTRAVENOUS | Status: DC | PRN
Start: 2023-09-07 — End: 2023-09-07
  Administered 2023-09-07: 15 mg via INTRAVENOUS
  Administered 2023-09-07: 5 mg via INTRAVENOUS
  Administered 2023-09-07: 25 mg via INTRAVENOUS
  Administered 2023-09-07: 10 mg via INTRAVENOUS

## 2023-09-07 MED ORDER — SODIUM CHLORIDE (PF) 0.9 % IJ SOLN
INTRAMUSCULAR | Status: DC | PRN
  Administered 2023-09-07: 80 mL

## 2023-09-07 MED ORDER — SODIUM CHLORIDE 0.9 % IV SOLN: INTRAVENOUS | Status: DC

## 2023-09-07 MED ORDER — PHENYLEPHRINE HCL-NACL 20-0.9 MG/250ML-% IV SOLN
INTRAVENOUS | Status: DC | PRN
  Administered 2023-09-07: 30 ug/min via INTRAVENOUS

## 2023-09-07 MED ORDER — CHLORHEXIDINE GLUCONATE 0.12 % MT SOLN
15.0000 mL | Freq: Once | OROMUCOSAL | Status: AC
  Administered 2023-09-07: 15 mL via OROMUCOSAL

## 2023-09-07 MED ORDER — METOCLOPRAMIDE HCL 5 MG PO TABS: 5.0000 mg | ORAL_TABLET | Freq: Three times a day (TID) | ORAL | Status: DC | PRN

## 2023-09-07 MED ORDER — INSULIN ASPART 100 UNIT/ML IJ SOLN: 0.0000 [IU] | INTRAMUSCULAR | Status: DC | PRN

## 2023-09-07 MED ORDER — OXYCODONE HCL 5 MG PO TABS
10.0000 mg | ORAL_TABLET | Freq: Four times a day (QID) | ORAL | Status: DC
  Administered 2023-09-07 – 2023-09-08 (×3): 10 mg via ORAL
  Filled 2023-09-07 (×3): qty 2

## 2023-09-07 MED ORDER — ONDANSETRON HCL 4 MG/2ML IJ SOLN
INTRAMUSCULAR | Status: DC | PRN
  Administered 2023-09-07: 4 mg via INTRAVENOUS

## 2023-09-07 MED ORDER — ROSUVASTATIN CALCIUM 5 MG PO TABS
5.0000 mg | ORAL_TABLET | ORAL | Status: DC
Start: 2023-09-09 — End: 2023-09-08

## 2023-09-07 MED ORDER — DOCUSATE SODIUM 100 MG PO CAPS
100.0000 mg | ORAL_CAPSULE | Freq: Two times a day (BID) | ORAL | Status: DC
  Administered 2023-09-07 – 2023-09-08 (×2): 100 mg via ORAL
  Filled 2023-09-07 (×2): qty 1

## 2023-09-07 MED ORDER — CEFAZOLIN SODIUM-DEXTROSE 2-4 GM/100ML-% IV SOLN
2.0000 g | Freq: Four times a day (QID) | INTRAVENOUS | Status: AC
  Administered 2023-09-07 – 2023-09-08 (×2): 2 g via INTRAVENOUS
  Filled 2023-09-07 (×2): qty 100

## 2023-09-07 MED ORDER — POLYETHYLENE GLYCOL 3350 17 G PO PACK: 17.0000 g | PACK | Freq: Every day | ORAL | Status: DC | PRN

## 2023-09-07 MED ORDER — 0.9 % SODIUM CHLORIDE (POUR BTL) OPTIME
TOPICAL | Status: DC | PRN
  Administered 2023-09-07: 1000 mL

## 2023-09-07 MED ORDER — GABAPENTIN 400 MG PO CAPS
800.0000 mg | ORAL_CAPSULE | Freq: Three times a day (TID) | ORAL | Status: DC
  Administered 2023-09-07 – 2023-09-08 (×3): 800 mg via ORAL
  Filled 2023-09-07 (×3): qty 2

## 2023-09-07 MED ORDER — ACETAMINOPHEN 500 MG PO TABS
1000.0000 mg | ORAL_TABLET | Freq: Four times a day (QID) | ORAL | Status: DC
  Administered 2023-09-07 – 2023-09-08 (×2): 1000 mg via ORAL
  Filled 2023-09-07 (×2): qty 2

## 2023-09-07 MED ORDER — TRANEXAMIC ACID-NACL 1000-0.7 MG/100ML-% IV SOLN
1000.0000 mg | INTRAVENOUS | Status: AC
  Administered 2023-09-07: 1000 mg via INTRAVENOUS
  Filled 2023-09-07: qty 100

## 2023-09-07 MED ORDER — INSULIN ASPART 100 UNIT/ML IJ SOLN
0.0000 [IU] | Freq: Three times a day (TID) | INTRAMUSCULAR | Status: DC
  Administered 2023-09-07 – 2023-09-08 (×2): 3 [IU] via SUBCUTANEOUS

## 2023-09-07 MED ORDER — SODIUM CHLORIDE (PF) 0.9 % IJ SOLN
INTRAMUSCULAR | Status: AC
  Filled 2023-09-07: qty 50

## 2023-09-07 MED ORDER — SODIUM CHLORIDE (PF) 0.9 % IJ SOLN
INTRAMUSCULAR | Status: AC
  Filled 2023-09-07: qty 10

## 2023-09-07 MED ORDER — PROPOFOL 10 MG/ML IV BOLUS
INTRAVENOUS | Status: DC | PRN
  Administered 2023-09-07: 10 mg via INTRAVENOUS

## 2023-09-07 MED ORDER — CYCLOBENZAPRINE HCL 10 MG PO TABS
10.0000 mg | ORAL_TABLET | Freq: Three times a day (TID) | ORAL | Status: DC | PRN
  Filled 2023-09-07: qty 1

## 2023-09-07 MED ORDER — PROCHLORPERAZINE MALEATE 10 MG PO TABS: 10.0000 mg | ORAL_TABLET | Freq: Four times a day (QID) | ORAL | Status: DC | PRN

## 2023-09-07 MED ORDER — DIPHENHYDRAMINE HCL 12.5 MG/5ML PO ELIX: 12.5000 mg | ORAL_SOLUTION | ORAL | Status: DC | PRN

## 2023-09-07 MED ORDER — SODIUM CHLORIDE 0.9 % IR SOLN
Status: DC | PRN
  Administered 2023-09-07: 1000 mL

## 2023-09-07 MED ORDER — PROPOFOL 1000 MG/100ML IV EMUL
INTRAVENOUS | Status: AC
  Filled 2023-09-07: qty 100

## 2023-09-07 MED ORDER — BUPIVACAINE-EPINEPHRINE (PF) 0.5% -1:200000 IJ SOLN
INTRAMUSCULAR | Status: DC | PRN
Start: 2023-09-07 — End: 2023-09-07
  Administered 2023-09-07: 20 mL via PERINEURAL

## 2023-09-07 MED ORDER — ACETAMINOPHEN 10 MG/ML IV SOLN
1000.0000 mg | Freq: Four times a day (QID) | INTRAVENOUS | Status: DC
Start: 2023-09-07 — End: 2023-09-07
  Administered 2023-09-07: 1000 mg via INTRAVENOUS
  Filled 2023-09-07: qty 100

## 2023-09-07 MED ORDER — STERILE WATER FOR IRRIGATION IR SOLN
Status: DC | PRN
  Administered 2023-09-07: 1000 mL

## 2023-09-07 MED ORDER — FLEET ENEMA RE ENEM: 1.0000 | ENEMA | Freq: Once | RECTAL | Status: DC | PRN

## 2023-09-07 MED ORDER — CEFAZOLIN SODIUM-DEXTROSE 2-4 GM/100ML-% IV SOLN
2.0000 g | INTRAVENOUS | Status: AC
  Administered 2023-09-07: 2 g via INTRAVENOUS
  Filled 2023-09-07: qty 100

## 2023-09-07 MED ORDER — FENTANYL CITRATE PF 50 MCG/ML IJ SOSY
50.0000 ug | PREFILLED_SYRINGE | INTRAMUSCULAR | Status: DC
  Administered 2023-09-07: 50 ug via INTRAVENOUS
  Filled 2023-09-07: qty 2

## 2023-09-07 MED ORDER — CLOPIDOGREL BISULFATE 75 MG PO TABS
75.0000 mg | ORAL_TABLET | Freq: Every day | ORAL | Status: DC
  Administered 2023-09-08: 75 mg via ORAL
  Filled 2023-09-07: qty 1

## 2023-09-07 MED ORDER — ASPIRIN 325 MG PO TBEC
325.0000 mg | DELAYED_RELEASE_TABLET | Freq: Every day | ORAL | Status: DC
Start: 2023-09-08 — End: 2023-09-08
  Filled 2023-09-07: qty 1

## 2023-09-07 MED ORDER — DEXAMETHASONE SODIUM PHOSPHATE 10 MG/ML IJ SOLN
10.0000 mg | Freq: Once | INTRAMUSCULAR | Status: AC
  Administered 2023-09-08: 10 mg via INTRAVENOUS
  Filled 2023-09-07: qty 1

## 2023-09-07 MED ORDER — BISACODYL 10 MG RE SUPP: 10.0000 mg | Freq: Every day | RECTAL | Status: DC | PRN

## 2023-09-07 MED ORDER — DEXAMETHASONE SODIUM PHOSPHATE 10 MG/ML IJ SOLN
INTRAMUSCULAR | Status: AC
  Filled 2023-09-07: qty 1

## 2023-09-07 MED ORDER — BUPIVACAINE LIPOSOME 1.3 % IJ SUSP
INTRAMUSCULAR | Status: AC
  Filled 2023-09-07: qty 20

## 2023-09-07 MED ORDER — HYDROMORPHONE HCL 1 MG/ML IJ SOLN: 0.5000 mg | INTRAMUSCULAR | Status: DC | PRN

## 2023-09-07 MED ORDER — PROPRANOLOL HCL 20 MG PO TABS
20.0000 mg | ORAL_TABLET | Freq: Two times a day (BID) | ORAL | Status: DC
  Administered 2023-09-07 – 2023-09-08 (×2): 20 mg via ORAL
  Filled 2023-09-07 (×2): qty 1

## 2023-09-07 MED ORDER — METOCLOPRAMIDE HCL 5 MG/ML IJ SOLN: 5.0000 mg | Freq: Three times a day (TID) | INTRAMUSCULAR | Status: DC | PRN

## 2023-09-07 MED ORDER — ALPRAZOLAM 0.25 MG PO TABS: 0.2500 mg | ORAL_TABLET | Freq: Every day | ORAL | Status: DC | PRN

## 2023-09-07 MED ORDER — TRIMETHOPRIM 100 MG PO TABS
100.0000 mg | ORAL_TABLET | Freq: Every day | ORAL | Status: DC
  Administered 2023-09-07 – 2023-09-08 (×2): 100 mg via ORAL
  Filled 2023-09-07 (×2): qty 1

## 2023-09-07 MED ORDER — EMPAGLIFLOZIN 10 MG PO TABS
10.0000 mg | ORAL_TABLET | Freq: Every day | ORAL | Status: DC
  Administered 2023-09-08: 10 mg via ORAL
  Filled 2023-09-07: qty 1

## 2023-09-07 MED ORDER — POVIDONE-IODINE 10 % EX SWAB: 2.0000 | Freq: Once | CUTANEOUS | Status: DC

## 2023-09-07 MED ORDER — BUPIVACAINE LIPOSOME 1.3 % IJ SUSP: 20.0000 mL | Freq: Once | INTRAMUSCULAR | Status: DC

## 2023-09-07 MED ORDER — MENTHOL 3 MG MT LOZG: 1.0000 | LOZENGE | OROMUCOSAL | Status: DC | PRN

## 2023-09-07 MED ORDER — ONDANSETRON HCL 4 MG/2ML IJ SOLN
INTRAMUSCULAR | Status: AC
  Filled 2023-09-07: qty 2

## 2023-09-07 MED ORDER — ORAL CARE MOUTH RINSE: 15.0000 mL | Freq: Once | OROMUCOSAL | Status: AC

## 2023-09-07 MED ORDER — HYDROMORPHONE HCL 2 MG PO TABS
2.0000 mg | ORAL_TABLET | ORAL | Status: DC | PRN
  Administered 2023-09-07: 2 mg via ORAL
  Administered 2023-09-08 (×2): 4 mg via ORAL
  Filled 2023-09-07: qty 2
  Filled 2023-09-07: qty 1
  Filled 2023-09-07: qty 2

## 2023-09-07 SURGICAL SUPPLY — 44 items
ATTUNE MED DOME PAT 41 KNEE (Knees) IMPLANT
ATTUNE PS FEM RT SZ 7 CEM KNEE (Femur) IMPLANT
BAG COUNTER SPONGE SURGICOUNT (BAG) IMPLANT
BAG ZIPLOCK 12X15 (MISCELLANEOUS) ×1 IMPLANT
BASE TIBIAL ROT PLAT SZ 8 KNEE (Knees) IMPLANT
BLADE SAG 18X100X1.27 (BLADE) ×1 IMPLANT
BLADE SAW SGTL 11.0X1.19X90.0M (BLADE) ×1 IMPLANT
BNDG ELASTIC 6INX 5YD STR LF (GAUZE/BANDAGES/DRESSINGS) ×1 IMPLANT
BOWL SMART MIX CTS (DISPOSABLE) ×1 IMPLANT
CEMENT HV SMART SET (Cement) ×2 IMPLANT
COVER SURGICAL LIGHT HANDLE (MISCELLANEOUS) ×1 IMPLANT
CUFF TRNQT CYL 34X4.125X (TOURNIQUET CUFF) ×1 IMPLANT
DERMABOND ADVANCED .7 DNX12 (GAUZE/BANDAGES/DRESSINGS) ×1 IMPLANT
DRAPE U-SHAPE 47X51 STRL (DRAPES) ×1 IMPLANT
DRSG AQUACEL AG ADV 3.5X10 (GAUZE/BANDAGES/DRESSINGS) ×1 IMPLANT
DURAPREP 26ML APPLICATOR (WOUND CARE) ×1 IMPLANT
ELECT PENCIL ROCKER SW 15FT (MISCELLANEOUS) ×1 IMPLANT
ELECT REM PT RETURN 15FT ADLT (MISCELLANEOUS) ×1 IMPLANT
GLOVE BIO SURGEON STRL SZ 6.5 (GLOVE) IMPLANT
GLOVE BIO SURGEON STRL SZ7 (GLOVE) IMPLANT
GLOVE BIO SURGEON STRL SZ8 (GLOVE) ×1 IMPLANT
GLOVE BIOGEL PI IND STRL 7.0 (GLOVE) ×1 IMPLANT
GLOVE BIOGEL PI IND STRL 8 (GLOVE) ×1 IMPLANT
GOWN STRL REUS W/ TWL LRG LVL3 (GOWN DISPOSABLE) ×1 IMPLANT
HOLDER FOLEY CATH W/STRAP (MISCELLANEOUS) ×1 IMPLANT
IMMOBILIZER KNEE 20 (SOFTGOODS) ×1 IMPLANT
IMMOBILIZER KNEE 20 THIGH 36 (SOFTGOODS) ×1 IMPLANT
INSERT ATTU PSRP SZ7 14MM KNEE (Insert) IMPLANT
KIT TURNOVER KIT A (KITS) ×1 IMPLANT
MANIFOLD NEPTUNE II (INSTRUMENTS) ×1 IMPLANT
NS IRRIG 1000ML POUR BTL (IV SOLUTION) ×1 IMPLANT
PACK TOTAL KNEE CUSTOM (KITS) ×1 IMPLANT
PADDING CAST COTTON 6X4 STRL (CAST SUPPLIES) ×2 IMPLANT
PIN STEINMAN FIXATION KNEE (PIN) IMPLANT
PROTECTOR NERVE ULNAR (MISCELLANEOUS) ×1 IMPLANT
SET HNDPC FAN SPRY TIP SCT (DISPOSABLE) ×1 IMPLANT
SUT MNCRL AB 4-0 PS2 18 (SUTURE) ×1 IMPLANT
SUT VIC AB 2-0 CT1 TAPERPNT 27 (SUTURE) ×3 IMPLANT
SUTURE STRATFX 0 PDS 27 VIOLET (SUTURE) ×1 IMPLANT
TOWEL GREEN STERILE FF (TOWEL DISPOSABLE) ×1 IMPLANT
TRAY FOLEY MTR SLVR 16FR STAT (SET/KITS/TRAYS/PACK) ×1 IMPLANT
TUBE SUCTION HIGH CAP CLEAR NV (SUCTIONS) ×1 IMPLANT
WATER STERILE IRR 1000ML POUR (IV SOLUTION) ×2 IMPLANT
WRAP KNEE MAXI GEL POST OP (GAUZE/BANDAGES/DRESSINGS) ×1 IMPLANT

## 2023-09-07 NOTE — Care Plan (Signed)
 Ortho Bundle Case Management Note  Patient Details  Name: David Li MRN: [id number] Date of Birth: [birth year]  RT TKA on 09/07/23  DCP: home with wife DME: RW; ordered through [employer] PT: EO 5/1                  DME Arranged:  Otho Blitz rolling DME Agency:  [employer]  HH Arranged:    HH Agency:     Additional Comments: Please contact me with any questions of if this plan should need to change.  Kathlene Paradise, Case Manager [facility] [phone] Ext [phone]   09/07/2023, 10:27 AM

## 2023-09-07 NOTE — Op Note (Signed)
 OPERATIVE REPORT-TOTAL KNEE ARTHROPLASTY   Pre-operative diagnosis- Osteoarthritis  Right knee(s)  Post-operative diagnosis- Osteoarthritis Right knee(s)  Procedure-  Right  Total Knee Arthroplasty  Surgeon- David Loft. Julizza Sassone, MD  Assistant- Sharlynn Dear, PA-C   Anesthesia-   Adductor canal block and spinal  EBL-30 mL   Drains None  Tourniquet time-  Total Tourniquet Time Documented: Thigh (Right) - 41 minutes Total: Thigh (Right) - 41 minutes     Complications- None  Condition-PACU - hemodynamically stable.   Brief Clinical Note  David Li is a 83 y.o. year old male with end stage OA of his right knee with progressively worsening pain and dysfunction. He has constant pain, with activity and at rest and significant functional deficits with difficulties even with ADLs. He has had extensive non-op management including analgesics, injections of cortisone and viscosupplements, and home exercise program, but remains in significant pain with significant dysfunction. Radiographs show bone on bone arthritis medial and patellofemoral. He presents now for right Total Knee Arthroplasty.     Procedure in detail---   The patient is brought into the operating room and positioned supine on the operating table. After successful administration of  Adductor canal block and spinal,   a tourniquet is placed high on the  Right thigh(s) and the lower extremity is prepped and draped in the usual sterile fashion. Time out is performed by the operating team and then the  Right lower extremity is wrapped in Esmarch, knee flexed and the tourniquet inflated to 300 mmHg.       A midline incision is made with a ten blade through the subcutaneous tissue to the level of the extensor mechanism. A fresh blade is used to make a medial parapatellar arthrotomy. Soft tissue over the proximal medial tibia is subperiosteally elevated to the joint line with a knife and into the semimembranosus bursa with a  Cobb elevator. Soft tissue over the proximal lateral tibia is elevated with attention being paid to avoiding the patellar tendon on the tibial tubercle. The patella is everted, knee flexed 90 degrees and the ACL and PCL are removed. Findings are bone on bone all 3 compartments with large  marginal osteophytes.      The drill is used to create a starting hole in the distal femur and the canal is thoroughly irrigated with sterile saline to remove the fatty contents. The 5 degree Right  valgus alignment guide is placed into the femoral canal and the distal femoral cutting block is pinned to remove 10 mm off the distal femur. Resection is made with an oscillating saw.      The tibia is subluxed forward and the menisci are removed. The extramedullary alignment guide is placed referencing proximally at the medial aspect of the tibial tubercle and distally along the second metatarsal axis and tibial crest. The block is pinned to remove 2mm off the more deficient lateral  side. Resection is made with an oscillating saw. Size 8is the most appropriate size for the tibia and the proximal tibia is prepared with the modular drill and keel punch for that size.      The femoral sizing guide is placed and size 7 is most appropriate. Rotation is marked off the epicondylar axis and confirmed by creating a rectangular flexion gap at 90 degrees. The size 7 cutting block is pinned in this rotation and the anterior, posterior and chamfer cuts are made with the oscillating saw. The intercondylar block is then placed and that cut is made.  Trial size 8 tibial component, trial size 7 posterior stabilized femur and a 14  mm posterior stabilized rotating platform insert trial is placed. Full extension is achieved with excellent varus/valgus and anterior/posterior balance throughout full range of motion. The patella is everted and thickness measured to be 27  mm. Free hand resection is taken to 15 mm, a 41 template is placed, lug holes  are drilled, trial patella is placed, and it tracks normally. Osteophytes are removed off the posterior femur with the trial in place. All trials are removed and the cut bone surfaces prepared with pulsatile lavage. Cement is mixed and once ready for implantation, the size 8 tibial implant, size  7 posterior stabilized femoral component, and the size 41 patella are cemented in place and the patella is held with the clamp. The trial insert is placed and the knee held in full extension. The Exparel  (20 ml mixed with 60 ml saline) is injected into the extensor mechanism, posterior capsule, medial and lateral gutters and subcutaneous tissues.  All extruded cement is removed and once the cement is hard the permanent 14 mm posterior stabilized rotating platform insert is placed into the tibial tray.      The wound is copiously irrigated with saline solution and the extensor mechanism closed with # 0 Stratofix suture. The tourniquet is released for a total tourniquet time of 41  minutes. Flexion against gravity is 140 degrees and the patella tracks normally. Subcutaneous tissue is closed with 2.0 vicryl and subcuticular with running 4.0 Monocryl. The incision is cleaned and dried and steri-strips and a bulky sterile dressing are applied. The limb is placed into a knee immobilizer and the patient is awakened and transported to recovery in stable condition.      Please note that a surgical assistant was a medical necessity for this procedure in order to perform it in a safe and expeditious manner. Surgical assistant was necessary to retract the ligaments and vital neurovascular structures to prevent injury to them and also necessary for proper positioning of the limb to allow for anatomic placement of the prosthesis.   David Loft Leeana Creer, MD    09/07/2023, 1:41 PM

## 2023-09-07 NOTE — Transfer of Care (Signed)
 Immediate Anesthesia Transfer of Care Note  Patient: David Li  Procedure(s) Performed: RIGHT TOTAL KNEE ARTHROPLASTY (Right: Knee)  Patient Location: PACU  Anesthesia Type:Spinal  Level of Consciousness: sedated  Airway & Oxygen Therapy: Patient Spontanous Breathing and Patient connected to face mask oxygen  Post-op Assessment: Report given to RN and Post -op Vital signs reviewed and stable  Post vital signs: Reviewed and stable  Last Vitals:  Vitals Value Taken Time  BP 118/85 09/07/23 1400  Temp    Pulse 51 09/07/23 1400  Resp 11 09/07/23 1400  SpO2 100 % 09/07/23 1400  Vitals shown include unfiled device data.  Last Pain:  Vitals:   09/07/23 1145  TempSrc:   PainSc: 0-No pain         Complications: No notable events documented.

## 2023-09-07 NOTE — Plan of Care (Signed)
   Problem: Activity: Goal: Risk for activity intolerance will decrease Outcome: Progressing   Problem: Nutrition: Goal: Adequate nutrition will be maintained Outcome: Progressing   Problem: Pain Managment: Goal: General experience of comfort will improve and/or be controlled Outcome: Progressing   Problem: Safety: Goal: Ability to remain free from injury will improve Outcome: Progressing

## 2023-09-07 NOTE — Discharge Instructions (Addendum)
 David Rei, MD Total Joint Specialist [facility] [address], [address] [place], [place] [address] [phone]  TOTAL KNEE REPLACEMENT POSTOPERATIVE DIRECTIONS    Knee Rehabilitation, Guidelines Following Surgery  Results after knee surgery are often greatly improved when you follow the exercise, range of motion and muscle strengthening exercises prescribed by your doctor. Safety measures are also important to protect the knee from further injury. If any of these exercises cause you to have increased pain or swelling in your knee joint, decrease the amount until you are comfortable again and slowly increase them. If you have problems or questions, call your caregiver or physical therapist for advice.   BLOOD CLOT PREVENTION Take a 325 mg Aspirin once a day for three weeks following surgery with your usual Plavix  dose. Then take an 81 mg Aspirin once a day for three weeks with your Plavix . Then discontinue Aspirin. You may resume your vitamins/supplements upon discharge from the hospital.   HOME CARE INSTRUCTIONS  Remove items at home which could result in a fall. This includes throw rugs or furniture in walking pathways.  ICE to the affected knee as much as tolerated. Icing helps control swelling. If the swelling is well controlled you will be more comfortable and rehab easier. Continue to use ice on the knee for pain and swelling from surgery. You may notice swelling that will progress down to the foot and ankle. This is normal after surgery. Elevate the leg when you are not up walking on it.    Continue to use the breathing machine which will help keep your temperature down. It is common for your temperature to cycle up and down following surgery, especially at night when you are not up moving around and exerting yourself. The breathing machine keeps your lungs expanded and your temperature down. Do not place pillow under the operative knee, focus on keeping the  knee straight while resting  DIET You may resume your previous home diet once you are discharged from the hospital.  DRESSING / WOUND CARE / SHOWERING Keep your bulky bandage on for 2 days. On the third post-operative day you may remove the Ace bandage and gauze. There is a waterproof adhesive bandage on your skin which will stay in place until your first follow-up appointment. Once you remove this you will not need to place another bandage You may begin showering 3 days following surgery, but do not submerge the incision under water.  ACTIVITY For the first 5 days, the key is rest and control of pain and swelling Do your home exercises twice a day starting on post-operative day 3. On the days you go to physical therapy, just do the home exercises once that day. You should rest, ice and elevate the leg for 50 minutes out of every hour. Get up and walk/stretch for 10 minutes per hour. After 5 days you can increase your activity slowly as tolerated. Walk with your walker as instructed. Use the walker until you are comfortable transitioning to a cane. Walk with the cane in the opposite hand of the operative leg. You may discontinue the cane once you are comfortable and walking steadily. Avoid periods of inactivity such as sitting longer than an hour when not asleep. This helps prevent blood clots.  You may discontinue the knee immobilizer once you are able to perform a straight leg raise while lying down. You may resume a sexual relationship in one month or when given the OK by your doctor.  You  may return to work once you are cleared by your doctor.  Do not drive a car for 6 weeks or until released by your surgeon.  Do not drive while taking narcotics.  TED HOSE STOCKINGS Wear the elastic stockings on both legs for three weeks following surgery during the day. You may remove them at night for sleeping.  WEIGHT BEARING Weight bearing as tolerated with assist device (walker, cane, etc) as  directed, use it as long as suggested by your surgeon or therapist, typically at least 4-6 weeks.  POSTOPERATIVE CONSTIPATION PROTOCOL Constipation - defined medically as fewer than three stools per week and severe constipation as less than one stool per week.  One of the most common issues patients have following surgery is constipation.  Even if you have a regular bowel pattern at home, your normal regimen is likely to be disrupted due to multiple reasons following surgery.  Combination of anesthesia, postoperative narcotics, change in appetite and fluid intake all can affect your bowels.  In order to avoid complications following surgery, here are some recommendations in order to help you during your recovery period.  Colace (docusate) - Pick up an over-the-counter form of Colace or another stool softener and take twice a day as long as you are requiring postoperative pain medications.  Take with a full glass of water daily.  If you experience loose stools or diarrhea, hold the colace until you stool forms back up. If your symptoms do not get better within 1 week or if they get worse, check with your doctor. Dulcolax (bisacodyl) - Pick up over-the-counter and take as directed by the product packaging as needed to assist with the movement of your bowels.  Take with a full glass of water.  Use this product as needed if not relieved by Colace only.  MiraLax (polyethylene glycol) - Pick up over-the-counter to have on hand. MiraLax is a solution that will increase the amount of water in your bowels to assist with bowel movements.  Take as directed and can mix with a glass of water, juice, soda, coffee, or tea. Take if you go more than two days without a movement. Do not use MiraLax more than once per day. Call your doctor if you are still constipated or irregular after using this medication for 7 days in a row.  If you continue to have problems with postoperative constipation, please contact the office for  further assistance and recommendations.  If you experience "the worst abdominal pain ever" or develop nausea or vomiting, please contact the office immediatly for further recommendations for treatment.  ITCHING If you experience itching with your medications, try taking only a single pain pill, or even half a pain pill at a time.  You can also use Benadryl over the counter for itching or also to help with sleep.   MEDICATIONS See your medication summary on the "After Visit Summary" that the nursing staff will review with you prior to discharge.  You may have some home medications which will be placed on hold until you complete the course of blood thinner medication.  It is important for you to complete the blood thinner medication as prescribed by your surgeon.  Continue your approved medications as instructed at time of discharge.  PRECAUTIONS If you experience chest pain or shortness of breath - call 911 immediately for transfer to the hospital emergency department.  If you develop a fever greater that 101 F, purulent drainage from wound, increased redness or drainage from wound,  foul odor from the wound/dressing, or calf pain - CONTACT YOUR SURGEON.                                                   FOLLOW-UP APPOINTMENTS Make sure you keep all of your appointments after your operation with your surgeon and caregivers. You should call the office at the above phone number and make an appointment for approximately two weeks after the date of your surgery or on the date instructed by your surgeon outlined in the "After Visit Summary".  RANGE OF MOTION AND STRENGTHENING EXERCISES  Rehabilitation of the knee is important following a knee injury or an operation. After just a few days of immobilization, the muscles of the thigh which control the knee become weakened and shrink (atrophy). Knee exercises are designed to build up the tone and strength of the thigh muscles and to improve knee motion. Often  times heat used for twenty to thirty minutes before working out will loosen up your tissues and help with improving the range of motion but do not use heat for the first two weeks following surgery. These exercises can be done on a training (exercise) mat, on the floor, on a table or on a bed. Use what ever works the best and is most comfortable for you Knee exercises include:  Leg Lifts - While your knee is still immobilized in a splint or cast, you can do straight leg raises. Lift the leg to 60 degrees, hold for 3 sec, and slowly lower the leg. Repeat 10-20 times 2-3 times daily. Perform this exercise against resistance later as your knee gets better.  Quad and Hamstring Sets - Tighten up the muscle on the front of the thigh (Quad) and hold for 5-10 sec. Repeat this 10-20 times hourly. Hamstring sets are done by pushing the foot backward against an object and holding for 5-10 sec. Repeat as with quad sets.  Leg Slides: Lying on your back, slowly slide your foot toward your buttocks, bending your knee up off the floor (only go as far as is comfortable). Then slowly slide your foot back down until your leg is flat on the floor again. Angel Wings: Lying on your back spread your legs to the side as far apart as you can without causing discomfort.  A rehabilitation program following serious knee injuries can speed recovery and prevent re-injury in the future due to weakened muscles. Contact your doctor or a physical therapist for more information on knee rehabilitation.   POST-OPERATIVE OPIOID TAPER INSTRUCTIONS: It is important to wean off of your opioid medication as soon as possible. If you do not need pain medication after your surgery it is ok to stop day one. Opioids include: Codeine, Hydrocodone (Norco, Vicodin), Oxycodone (Percocet, oxycontin ) and hydromorphone amongst others.  Long term and even short term use of opiods can cause: Increased pain  response Dependence Constipation Depression Respiratory depression And more.  Withdrawal symptoms can include Flu like symptoms Nausea, vomiting And more Techniques to manage these symptoms Hydrate well Eat regular healthy meals Stay active Use relaxation techniques(deep breathing, meditating, yoga) Do Not substitute Alcohol to help with tapering If you have been on opioids for less than two weeks and do not have pain than it is ok to stop all together.  Plan to wean off of opioids This plan should start within  one week post op of your joint replacement. Maintain the same interval or time between taking each dose and first decrease the dose.  Cut the total daily intake of opioids by one tablet each day Next start to increase the time between doses. The last dose that should be eliminated is the evening dose.   IF YOU ARE TRANSFERRED TO A SKILLED REHAB FACILITY If the patient is transferred to a skilled rehab facility following release from the hospital, a list of the current medications will be sent to the facility for the patient to continue.  When discharged from the skilled rehab facility, please have the facility set up the patient's Home Health Physical Therapy prior to being released. Also, the skilled facility will be responsible for providing the patient with their medications at time of release from the facility to include their pain medication, the muscle relaxants, and their blood thinner medication. If the patient is still at the rehab facility at time of the two week follow up appointment, the skilled rehab facility will also need to assist the patient in arranging follow up appointment in our office and any transportation needs.  MAKE SURE YOU:  Understand these instructions.  Get help right away if you are not doing well or get worse.   DENTAL ANTIBIOTICS:  In most cases prophylactic antibiotics for Dental procdeures after total joint surgery are not  necessary.  Exceptions are as follows:  1. History of prior total joint infection  2. Severely immunocompromised (Organ Transplant, cancer chemotherapy, Rheumatoid biologic meds such as Humera)  3. Poorly controlled diabetes (A1C &gt; 8.0, blood glucose over 200)  If you have one of these conditions, contact your surgeon for an antibiotic prescription, prior to your dental procedure.    Pick up stool softner and laxative for home use following surgery while on pain medications. Do not submerge incision under water. Please use good hand washing techniques while changing dressing each day. May shower starting three days after surgery. Please use a clean towel to pat the incision dry following showers. Continue to use ice for pain and swelling after surgery. Do not use any lotions or creams on the incision until instructed by your surgeon.

## 2023-09-07 NOTE — Anesthesia Procedure Notes (Signed)
 Anesthesia Regional Block: Adductor canal block   Pre-Anesthetic Checklist: , timeout performed,  Correct Patient, Correct Site, Correct Laterality,  Correct Procedure, Correct Position, site marked,  Risks and benefits discussed,  Pre-op evaluation,  At surgeon's request and post-op pain management  Laterality: Right  Prep: Maximum Sterile Barrier Precautions used, chloraprep       Needles:  Injection technique: Single-shot  Needle Type: Echogenic Stimulator Needle     Needle Length: 9cm  Needle Gauge: 21     Additional Needles:   Procedures:,,,, ultrasound used (permanent image in chart),,    Narrative:  Start time: 09/07/2023 11:30 AM End time: 09/07/2023 11:40 AM Injection made incrementally with aspirations every 5 mL.  Performed by: Personally  Anesthesiologist: Jake Mayers, MD

## 2023-09-07 NOTE — Progress Notes (Signed)
 Orthopedic Tech Progress Note Patient Details:  David Li February 23, 1941 [id number]  CPM Right Knee CPM Right Knee: On Right Knee Flexion (Degrees): 40 Right Knee Extension (Degrees): 10  Post Interventions Patient Tolerated: Well Ortho Devices Type of Ortho Device: CPM padding Ortho Device/Splint Location: RLE Ortho Device/Splint Interventions: Ordered, Application   Post Interventions Patient Tolerated: Well  Pt placed in CPM at 3:50 pm  Imari Sivertsen OTR/L 09/07/2023, 4:36 PM

## 2023-09-07 NOTE — Anesthesia Postprocedure Evaluation (Signed)
 Anesthesia Post Note  Patient: David Li  Procedure(s) Performed: RIGHT TOTAL KNEE ARTHROPLASTY (Right: Knee)     Patient location during evaluation: PACU Anesthesia Type: Spinal and Regional Level of consciousness: oriented and awake and alert Pain management: pain level controlled Vital Signs Assessment: post-procedure vital signs reviewed and stable Respiratory status: spontaneous breathing and respiratory function stable Cardiovascular status: blood pressure returned to baseline and stable Postop Assessment: no headache, no backache, no apparent nausea or vomiting, spinal receding and patient able to bend at knees Anesthetic complications: no  No notable events documented.  Last Vitals:  Vitals:   09/07/23 1500 09/07/23 1515  BP: (!) 147/104 122/67  Pulse: (!) 51 (!) 52  Resp: 13 13  Temp:  (!) 36.3 C  SpO2: 97% 98%    Last Pain:  Vitals:   09/07/23 1515  TempSrc:   PainSc: 0-No pain                 Cristianna Cyr,W. EDMOND

## 2023-09-07 NOTE — Evaluation (Signed)
 Physical Therapy Evaluation Patient Details Name: David Li MRN: [id number] DOB: Oct 17, 1940 Today's Date: 09/07/2023  History of Present Illness  Pt is 83 yo male s/p R TKA on 09/07/23.  Pt with hx including but not limited to arthritis, CVA, GERD, HTN, polymyalgia rheumatica, DM2, tremor, low back pain, peripheral neuropathy  Clinical Impression  Pt is s/p TKA resulting in the deficits listed below (see PT Problem List). At baseline , pt is independent.  He resides at home with his wife and has a ramp to enter.  Today, pt with good pain control and quad activation.  He was able to ambulate 35' with RW and CGA.  Pt expected to progress well.  Pt will benefit from acute skilled PT to increase their independence and safety with mobility to allow discharge.          If plan is discharge home, recommend the following: A little help with walking and/or transfers;A little help with bathing/dressing/bathroom;Help with stairs or ramp for entrance   Can travel by private vehicle        Equipment Recommendations Rolling walker (2 wheels) (only has rollator - need RW)  Recommendations for Other Services       Functional Status Assessment Patient has had a recent decline in their functional status and demonstrates the ability to make significant improvements in function in a reasonable and predictable amount of time.     Precautions / Restrictions Precautions Precautions: Fall;Knee Required Braces or Orthoses: Knee Immobilizer - Right Knee Immobilizer - Right: Discontinue once straight leg raise with < 10 degree lag Restrictions Weight Bearing Restrictions Per Provider Order: Yes RLE Weight Bearing Per Provider Order: Weight bearing as tolerated      Mobility  Bed Mobility Overal bed mobility: Needs Assistance Bed Mobility: Supine to Sit     Supine to sit: Supervision          Transfers Overall transfer level: Needs assistance Equipment used: Rolling walker (2  wheels) Transfers: Sit to/from Stand Sit to Stand: Contact guard assist           General transfer comment: cues for hand placement and R LE management    Ambulation/Gait Ambulation/Gait assistance: Contact guard assist Gait Distance (Feet): 40 Feet Assistive device: Rolling walker (2 wheels) Gait Pattern/deviations: Step-to pattern, Decreased stride length Gait velocity: decreased but functional     General Gait Details: cues for RW use and proximity  Stairs            Wheelchair Mobility     Tilt Bed    Modified Rankin (Stroke Patients Only)       Balance Overall balance assessment: Needs assistance Sitting-balance support: No upper extremity supported Sitting balance-Leahy Scale: Good     Standing balance support: Bilateral upper extremity supported, Reliant on assistive device for balance Standing balance-Leahy Scale: Poor                               Pertinent Vitals/Pain Pain Assessment Pain Assessment: 0-10 Pain Score: 2  Pain Location: R knee Pain Descriptors / Indicators: Sore Pain Intervention(s): Limited activity within patient's tolerance, Monitored during session, Repositioned, Premedicated before session    Home Living Family/patient expects to be discharged to:: Private residence Living Arrangements: Spouse/significant other (wife) Available Help at Discharge: Family;Available 24 hours/day Type of Home: House Home Access: Ramped entrance       Home Layout: One level Home Equipment: Educational psychologist (4 wheels);Cane -  single point      Prior Function Prior Level of Function : Independent/Modified Independent;Driving             Mobility Comments: Could ambulate in community without AD; occasionally uses his rollator ADLs Comments: pt independent with adls     Extremity/Trunk Assessment   Upper Extremity Assessment Upper Extremity Assessment: RUE deficits/detail;LUE deficits/detail RUE Deficits /  Details: ROM WFL; MMT 5/5; Peripheral Neuropathy baseline; Essential Tremor baseline LUE Deficits / Details: ROM WFL; MMT 5/5; Peripheral Neuropathy baseline; Essential Tremor baseline    Lower Extremity Assessment Lower Extremity Assessment: LLE deficits/detail;RLE deficits/detail RLE Deficits / Details: Expected post op changes; ROM Knee 5 to 60 degrees; MMT ankle 5/5, knee and hip 3/5 not further tested ; able to SLR with <10 degrees lag LLE Deficits / Details: ROM WFL (knee hyperextends); MMT 5/5    Cervical / Trunk Assessment Cervical / Trunk Assessment: Normal  Communication        Cognition Arousal: Alert Behavior During Therapy: WFL for tasks assessed/performed   PT - Cognitive impairments: No apparent impairments                                 Cueing       General Comments General comments (skin integrity, edema, etc.): VSS    Exercises     Assessment/Plan    PT Assessment Patient needs continued PT services  PT Problem List Decreased strength;Pain;Decreased range of motion;Decreased activity tolerance;Decreased balance;Decreased mobility;Decreased knowledge of use of DME       PT Treatment Interventions Therapeutic exercise;Gait training;Stair training;Functional mobility training;Therapeutic activities;Patient/family education;Modalities;Balance training;DME instruction    PT Goals (Current goals can be found in the Care Plan section)  Acute Rehab PT Goals Patient Stated Goal: return home PT Goal Formulation: With patient Time For Goal Achievement: 09/21/23 Potential to Achieve Goals: Good    Frequency 7X/week     Co-evaluation               AM-PAC PT "6 Clicks" Mobility  Outcome Measure Help needed turning from your back to your side while in a flat bed without using bedrails?: A Little Help needed moving from lying on your back to sitting on the side of a flat bed without using bedrails?: A Little Help needed moving to and from  a bed to a chair (including a wheelchair)?: A Little Help needed standing up from a chair using your arms (e.g., wheelchair or bedside chair)?: A Little Help needed to walk in hospital room?: A Little Help needed climbing 3-5 steps with a railing? : A Little 6 Click Score: 18    End of Session Equipment Utilized During Treatment: Gait belt Activity Tolerance: Patient tolerated treatment well Patient left: with call bell/phone within reach;in chair;with chair alarm set;with SCD's reapplied Nurse Communication: Mobility status PT Visit Diagnosis: Other abnormalities of gait and mobility (R26.89);Muscle weakness (generalized) (M62.81)    Time: 1610-9604 PT Time Calculation (min) (ACUTE ONLY): 28 min   Charges:   PT Evaluation $PT Eval Low Complexity: 1 Low PT Treatments $Gait Training: 8-22 mins PT General Charges $$ ACUTE PT VISIT: 1 Visit         Cyd Dowse, PT Acute Rehab [facility] [phone]   Carolynn Citrin 09/07/2023, 6:02 PM

## 2023-09-07 NOTE — Interval H&P Note (Signed)
 History and Physical Interval Note:  09/07/2023 11:37 AM  David Li  has presented today for surgery, with the diagnosis of Osteoarthritis right knee.  The various methods of treatment have been discussed with the patient and family. After consideration of risks, benefits and other options for treatment, the patient has consented to  Procedure(s): ARTHROPLASTY, KNEE, TOTAL (Right) as a surgical intervention.  The patient's history has been reviewed, patient examined, no change in status, stable for surgery.  I have reviewed the patient's chart and labs.  Questions were answered to the patient's satisfaction.     [name]

## 2023-09-07 NOTE — Progress Notes (Signed)
 Orthopedic Tech Progress Note Patient Details:  David Li 06-Feb-1941 [id number]  CPM Right Knee CPM Right Knee: On Right Knee Flexion (Degrees): 40 Right Knee Extension (Degrees): 10  Post Interventions Patient Tolerated: Well  Pt removed from CPM by Pt around 5:15 pm per notes.  Placed back in CPM at 8:00 for 2 more hours.  Pt's nurse notified to remove him from CPM around 10:00.    Mariaelena Cade OTR/L 09/07/2023, 8:00 PM

## 2023-09-08 ENCOUNTER — Encounter (HOSPITAL_COMMUNITY): Payer: Self-pay | Admitting: Orthopedic Surgery

## 2023-09-08 ENCOUNTER — Other Ambulatory Visit (HOSPITAL_COMMUNITY): Payer: Self-pay

## 2023-09-08 DIAGNOSIS — Z79899 Other long term (current) drug therapy: Secondary | ICD-10-CM | POA: Diagnosis not present

## 2023-09-08 DIAGNOSIS — Z96651 Presence of right artificial knee joint: Secondary | ICD-10-CM | POA: Diagnosis not present

## 2023-09-08 DIAGNOSIS — M1711 Unilateral primary osteoarthritis, right knee: Secondary | ICD-10-CM | POA: Diagnosis not present

## 2023-09-08 DIAGNOSIS — I119 Hypertensive heart disease without heart failure: Secondary | ICD-10-CM | POA: Diagnosis not present

## 2023-09-08 DIAGNOSIS — E119 Type 2 diabetes mellitus without complications: Secondary | ICD-10-CM | POA: Diagnosis not present

## 2023-09-08 DIAGNOSIS — Z8673 Personal history of transient ischemic attack (TIA), and cerebral infarction without residual deficits: Secondary | ICD-10-CM | POA: Diagnosis not present

## 2023-09-08 DIAGNOSIS — Z7902 Long term (current) use of antithrombotics/antiplatelets: Secondary | ICD-10-CM | POA: Diagnosis not present

## 2023-09-08 DIAGNOSIS — Z87891 Personal history of nicotine dependence: Secondary | ICD-10-CM | POA: Diagnosis not present

## 2023-09-08 LAB — BASIC METABOLIC PANEL WITH GFR
Anion gap: 8 (ref 5–15)
BUN: 14 mg/dL (ref 8–23)
CO2: 25 mmol/L (ref 22–32)
Calcium: 8.4 mg/dL — ABNORMAL LOW (ref 8.9–10.3)
Chloride: 105 mmol/L (ref 98–111)
Creatinine, Ser: 0.54 mg/dL — ABNORMAL LOW (ref 0.61–1.24)
GFR, Estimated: >60 mL/min (ref >60)
Glucose, Bld: 170 mg/dL — ABNORMAL HIGH (ref 70–99)
Potassium: 4.5 mmol/L (ref 3.5–5.1)
Sodium: 138 mmol/L (ref 135–145)

## 2023-09-08 LAB — CBC
HCT: 37 % — ABNORMAL LOW (ref 39.0–52.0)
Hemoglobin: 12 g/dL — ABNORMAL LOW (ref 13.0–17.0)
MCH: 31.3 pg (ref 26.0–34.0)
MCHC: 32.4 g/dL (ref 30.0–36.0)
MCV: 96.6 fL (ref 80.0–100.0)
Platelets: 213 10*3/uL (ref 150–400)
RBC: 3.83 MIL/uL — ABNORMAL LOW (ref 4.22–5.81)
RDW: 12.6 % (ref 11.5–15.5)
WBC: 10.6 10*3/uL — ABNORMAL HIGH (ref 4.0–10.5)
nRBC: 0 % (ref 0.0–0.2)

## 2023-09-08 LAB — GLUCOSE, CAPILLARY: Glucose-Capillary: 200 mg/dL — ABNORMAL HIGH (ref 70–99)

## 2023-09-08 MED ORDER — HYDROMORPHONE HCL 2 MG PO TABS
2.0000 mg | ORAL_TABLET | Freq: Four times a day (QID) | ORAL | 0 refills | Status: AC | PRN
  Filled 2023-09-08: qty 42, 6d supply, fill #0

## 2023-09-08 MED ORDER — ASPIRIN 325 MG PO TBEC
325.0000 mg | DELAYED_RELEASE_TABLET | Freq: Every day | ORAL | 0 refills | Status: AC
  Filled 2023-09-08: qty 20, 20d supply, fill #0

## 2023-09-08 MED ORDER — CYCLOBENZAPRINE HCL 10 MG PO TABS
10.0000 mg | ORAL_TABLET | Freq: Three times a day (TID) | ORAL | 0 refills | Status: AC | PRN
  Filled 2023-09-08: qty 30, 10d supply, fill #0

## 2023-09-08 NOTE — TOC Transition Note (Signed)
 Transition of Care [facility]) - Discharge Note   Patient Details  Name: David Li MRN: [id number] Date of Birth: [birth year]  Transition of Care [facility]) CM/SW Contact:  Delilah Fend, LCSW Phone Number: 09/08/2023, 10:43 AM   Clinical Narrative:     Met with pt and confirming he has received RW to room via [employer].  OPPT already arranged with [facility]. No further TOC needs.  Final next level of care: OP Rehab Barriers to Discharge: No Barriers Identified   Patient Goals and CMS Choice Patient states their goals for this hospitalization and ongoing recovery are:: return home          Discharge Placement                       Discharge Plan and Services Additional resources added to the After Visit Summary for                  DME Arranged: Walker rolling DME Agency: [employer]                  Social Drivers of Health (SDOH) Interventions SDOH Screenings   Food Insecurity: No Food Insecurity (09/07/2023)  Housing: Low Risk  (09/07/2023)  Transportation Needs: No Transportation Needs (09/07/2023)  Utilities: Not At Risk (09/07/2023)  Social Connections: Socially Integrated (09/07/2023)  Tobacco Use: Medium Risk (09/07/2023)     Readmission Risk Interventions     No data to display

## 2023-09-08 NOTE — Progress Notes (Signed)
 Pt due to void, claims he voided and flushed it down the toilet, said he is ok to DC. Pulled IV out on his own.

## 2023-09-08 NOTE — Plan of Care (Signed)
  Problem: Pain Managment: Goal: General experience of comfort will improve and/or be controlled Outcome: Progressing   Problem: Health Behavior/Discharge Planning: Goal: Ability to manage health-related needs will improve Outcome: Progressing   Problem: Activity: Goal: Range of joint motion will improve Outcome: Progressing   Problem: Pain Management: Goal: Pain level will decrease with appropriate interventions Outcome: Progressing

## 2023-09-08 NOTE — Progress Notes (Signed)
 Subjective: 1 Day Post-Op Procedure(s) (LRB): RIGHT TOTAL KNEE ARTHROPLASTY (Right) Patient reports pain as mild.   Patient seen in rounds by Dr. France Ina. Patient is well, and has had no acute complaints or problems No issues overnight. Denies chest pain, SOB, or calf pain. Foley catheter removed this AM.  We will continue therapy today, ambulated 40' yesterday.   Objective: Vital signs in last 24 hours: Temp:  [97.4 F (36.3 C)-98.5 F (36.9 C)] 98.4 F (36.9 C) (04/29 0609) Pulse Rate:  [47-67] 66 (04/29 0609) Resp:  [9-20] 20 (04/29 0609) BP: (95-163)/(59-109) 130/76 (04/29 0609) SpO2:  [92 %-100 %] 96 % (04/29 0609) Weight:  [97.5 kg] 97.5 kg (04/28 1023)  Intake/Output from previous day:  Intake/Output Summary (Last 24 hours) at 09/08/2023 0738 Last data filed at 09/08/2023 0600 Gross per 24 hour  Intake 3116.25 ml  Output 2530 ml  Net 586.25 ml     Intake/Output this shift: No intake/output data recorded.  Labs: Recent Labs    09/08/23 0321  HGB 12.0*   Recent Labs    09/08/23 0321  WBC 10.6*  RBC 3.83*  HCT 37.0*  PLT 213   Recent Labs    09/08/23 0321  NA 138  K 4.5  CL 105  CO2 25  BUN 14  CREATININE 0.54*  GLUCOSE 170*  CALCIUM 8.4*   No results for input(s): "LABPT", "INR" in the last 72 hours.  Exam: General - Patient is Alert and Oriented Extremity - Neurologically intact Neurovascular intact Sensation intact distally Dorsiflexion/Plantar flexion intact Dressing - dressing C/D/I Motor Function - intact, moving foot and toes well on exam.   Past Medical History:  Diagnosis Date   Anemia    Anxiety    Arthritis    BPH (benign prostatic hyperplasia)    self caths 1-2 times daily.   Diabetes mellitus without complication (HCC)    GERD (gastroesophageal reflux disease)    Hypertension    Neuromuscular disorder (HCC)    idiopathic,both feet,progressing to legs   Perforated ulcer (HCC) 1982   Stroke [facility])    states the Plavix   is preventative due to the strong family history of stroke   Superficial basal cell carcinoma (BCC) 03/25/2021   Right Anterior Neck    Assessment/Plan: 1 Day Post-Op Procedure(s) (LRB): RIGHT TOTAL KNEE ARTHROPLASTY (Right) Principal Problem:   OA (osteoarthritis) of knee Active Problems:   Primary osteoarthritis of right knee  Estimated body mass index is 31.29 kg/m as calculated from the following:   Height as of this encounter: 5' 9.5" (1.765 m).   Weight as of this encounter: 97.5 kg. Advance diet Up with therapy D/C IV fluids  Patient's anticipated LOS is less than 2 midnights, meeting these requirements: - Lives within 1 hour of care - Has a competent adult at home to recover with post-op - NO history of             - Coronary Artery Disease             - Heart failure             - Heart attack             - DVT/VTE             - Cardiac arrhythmia             - Respiratory Failure/COPD             - Renal failure             -  Anemia             - Advanced Liver disease  DVT Prophylaxis - Aspirin and Plavix  Weight bearing as tolerated. Continue therapy.  Plan is to go Home after hospital stay. Plan for discharge later today if progresses with therapy and meeting goals. Scheduled for OPPT at [facility]. Follow-up in the office in 2 weeks.  The PDMP database was reviewed today prior to any opioid medications being prescribed to this patient.  Sharlynn Dear, PA-C Orthopedic Surgery [phone] 09/08/2023, 7:38 AM

## 2023-09-08 NOTE — Progress Notes (Signed)
 Physical Therapy Treatment Patient Details Name: David Li MRN: [id number] DOB: 10/31/1940 Today's Date: 09/08/2023   History of Present Illness Pt is 83 yo male s/p R TKA on 09/07/23.  Pt with hx including but not limited to arthritis, CVA, GERD, HTN, polymyalgia rheumatica, DM2, tremor, low back pain, peripheral neuropathy    PT Comments  Pt is POD # 1 and is progressing well.  He has excellent pain control, quad activation, and understanding of HEP and safety.  Does have some difficulty with terminal knee ext - discussed quad sets frequently , resting with leg straight, and low load long duration ext stretch.  Pt ambulating 100' with RW safely.  Pt demonstrates safe gait & transfers in order to return home from PT perspective once discharged by MD.  While in hospital, will continue to benefit from PT for skilled therapy to advance mobility and exercises.       If plan is discharge home, recommend the following: A little help with walking and/or transfers;A little help with bathing/dressing/bathroom;Help with stairs or ramp for entrance   Can travel by private vehicle        Equipment Recommendations  Rolling walker (2 wheels)    Recommendations for Other Services       Precautions / Restrictions Precautions Precautions: Fall;Knee Required Braces or Orthoses: Knee Immobilizer - Right Knee Immobilizer - Right: Discontinue once straight leg raise with < 10 degree lag Restrictions RLE Weight Bearing Per Provider Order: Weight bearing as tolerated     Mobility  Bed Mobility Overal bed mobility: Needs Assistance Bed Mobility: Supine to Sit     Supine to sit: Supervision          Transfers Overall transfer level: Needs assistance Equipment used: Rolling walker (2 wheels) Transfers: Sit to/from Stand Sit to Stand: Supervision           General transfer comment: Cues for hand placement    Ambulation/Gait Ambulation/Gait assistance: Supervision Gait Distance  (Feet): 100 Feet Assistive device: Rolling walker (2 wheels) Gait Pattern/deviations: Step-through pattern, Decreased weight shift to right Gait velocity: decreased but functional     General Gait Details: Tolerated well wtih good RW proximity   Stairs Stairs:  (has ramp)           Wheelchair Mobility     Tilt Bed    Modified Rankin (Stroke Patients Only)       Balance Overall balance assessment: Needs assistance Sitting-balance support: No upper extremity supported Sitting balance-Leahy Scale: Good     Standing balance support: Bilateral upper extremity supported, No upper extremity supported Standing balance-Leahy Scale: Fair Standing balance comment: RW to ambulate but could static stand for ADLs                            Communication    Cognition Arousal: Alert Behavior During Therapy: WFL for tasks assessed/performed   PT - Cognitive impairments: No apparent impairments                       PT - Cognition Comments: Aware of rehab process, was a Engineer, 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  Also discussed low load long duration knee ext stretch to assist with terminal knee ext    General Comments  Educated on safe ice use, no pivots, car transfers, resting with leg straight, and TED hose during day. Also, encouraged walking every 1-2 hours during day. Educated on HEP with focus on mobility the first weeks. Discussed doing exercises within pain control and if pain increasing  could decreased ROM, reps, and stop exercises as needed. Encouraged to perform quad sets and ankle pumps frequently for blood flow and to promote full knee extension.       Pertinent Vitals/Pain Pain Assessment Pain Assessment: 0-10 Pain Score: 3  Pain Location: R knee Pain Descriptors / Indicators: Sore Pain Intervention(s): Limited activity within patient's tolerance, Monitored during session, Premedicated before session, Repositioned, Ice applied    Home Living                          Prior Function            PT Goals (current goals can now be found in the care plan section) Progress towards PT goals: Progressing toward goals    Frequency    7X/week      PT Plan      Co-evaluation              AM-PAC PT "6 Clicks" Mobility   Outcome Measure  Help needed turning from your back to your side while in a flat bed without using bedrails?: A Little Help needed moving from lying on your back to sitting on the side of a flat bed without using bedrails?: A Little Help needed moving to and from a bed to a chair (including a wheelchair)?: A Little Help needed standing up from a chair using your arms (e.g., wheelchair or bedside chair)?: A Little Help needed to walk in hospital room?: A Little Help needed climbing 3-5 steps with a railing? : A Little 6 Click Score: 18    End of Session Equipment Utilized During Treatment: Gait belt Activity Tolerance: Patient tolerated treatment well Patient left: with call bell/phone within reach;in chair Nurse Communication: Mobility status PT Visit Diagnosis: Other abnormalities of gait and mobility (R26.89);Muscle weakness (generalized) (M62.81)     Time: 1000-1034 PT Time Calculation (min) (ACUTE ONLY): 34 min  Charges:    $Gait Training: 8-22 mins $Therapeutic Exercise: 8-22 mins PT General Charges $$ ACUTE PT VISIT: 1 Visit                     Cyd Dowse, PT Acute Rehab [facility]  [phone]    Carolynn Citrin 09/08/2023, 11:29 AM

## 2023-09-08 NOTE — Progress Notes (Signed)
 Patient refused 12 noon CBG

## 2023-09-09 NOTE — Discharge Summary (Signed)
 Patient ID: David Li MRN: [id number] DOB/AGE: 14-Aug-1940 83 y.o.  Admit date: 09/07/2023 Discharge date: 09/08/2023  Admission Diagnoses:  Principal Problem:   OA (osteoarthritis) of knee Active Problems:   Primary osteoarthritis of right knee   Discharge Diagnoses:  Same  Past Medical History:  Diagnosis Date   Anemia    Anxiety    Arthritis    BPH (benign prostatic hyperplasia)    self caths 1-2 times daily.   Diabetes mellitus without complication (HCC)    GERD (gastroesophageal reflux disease)    Hypertension    Neuromuscular disorder (HCC)    idiopathic,both feet,progressing to legs   Perforated ulcer (HCC) 1982   Stroke [facility])    states the Plavix  is preventative due to the strong family history of stroke   Superficial basal cell carcinoma (BCC) 03/25/2021   Right Anterior Neck    Surgeries: Procedure(s): RIGHT TOTAL KNEE ARTHROPLASTY on 09/07/2023   Consultants:   Discharged Condition: Improved  Hospital Course: David Li is an 83 y.o. male who was admitted 09/07/2023 for operative treatment ofOA (osteoarthritis) of knee. Patient has severe unremitting pain that affects sleep, daily activities, and work/hobbies. After pre-op clearance the patient was taken to the operating room on 09/07/2023 and underwent  Procedure(s): RIGHT TOTAL KNEE ARTHROPLASTY.    Patient was given perioperative antibiotics:  Anti-infectives (From admission, onward)    Start     Dose/Rate Route Frequency Ordered Stop   09/07/23 2200  trimethoprim (TRIMPEX) tablet 100 mg  Status:  Discontinued        100 mg Oral Daily 09/07/23 1523 09/08/23 1742   09/07/23 1800  ceFAZolin (ANCEF) IVPB 2g/100 mL premix        2 g 200 mL/hr over 30 Minutes Intravenous Every 6 hours 09/07/23 1523 09/08/23 0100   09/07/23 1015  ceFAZolin (ANCEF) IVPB 2g/100 mL premix        2 g 200 mL/hr over 30 Minutes Intravenous On call to O.R. 09/07/23 1011 09/07/23 1224        Patient was given  sequential compression devices, early ambulation, and chemoprophylaxis to prevent DVT.  Patient benefited maximally from hospital stay and there were no complications.    Recent vital signs: Patient Vitals for the past 24 hrs:  BP Temp Pulse Resp SpO2  09/08/23 1002 (!) 131/96 97.8 F (36.6 C) 65 18 98 %     Recent laboratory studies:  Recent Labs    09/08/23 0321  WBC 10.6*  HGB 12.0*  HCT 37.0*  PLT 213  NA 138  K 4.5  CL 105  CO2 25  BUN 14  CREATININE 0.54*  GLUCOSE 170*  CALCIUM 8.4*     Discharge Medications:   Allergies as of 09/08/2023       Reactions   Amoxicillin  Nausea And Vomiting   Macrobid  [nitrofurantoin Macrocrystal] Nausea And Vomiting   Severe N&V   Other    Muscle relaxant medication-unsure of the name        Medication List     TAKE these medications    acyclovir 400 MG tablet Commonly known as: ZOVIRAX Take 400 mg by mouth daily as needed (cold sores). Outbreaks   ALPRAZolam 0.25 MG tablet Commonly known as: XANAX Take 0.25 mg by mouth daily as needed for anxiety.   aspirin EC 325 MG tablet Take 1 tablet (325 mg total) by mouth daily for 20 days. Then take one 81 mg aspirin once a day for three weeks. Then  discontinue aspirin.   Azelastine HCl 137 MCG/SPRAY Soln Place 2 sprays into both nostrils daily as needed (allergies).   b complex vitamins capsule Take 1 capsule by mouth daily.   clopidogrel  75 MG tablet Commonly known as: PLAVIX  TAKE 1 TABLET BY MOUTH EVERY DAY   cyclobenzaprine 10 MG tablet Commonly known as: FLEXERIL Take 1 tablet (10 mg total) by mouth 3 (three) times daily as needed for muscle spasms.   DULoxetine  60 MG capsule Commonly known as: CYMBALTA  TAKE 1 CAPSULE BY MOUTH EVERY DAY   gabapentin  800 MG tablet Commonly known as: NEURONTIN  Take 1 tablet (800 mg total) by mouth 3 (three) times daily.   HYDROmorphone 2 MG tablet Commonly known as: DILAUDID Take 1-2 tablets by mouth every 6 hours as  needed for severe pain (pain score 7-10) (severe breakthrough pain not responding to chronic oxycodone ).   Jardiance 10 MG Tabs tablet Generic drug: empagliflozin Take 10 mg by mouth daily.   naloxone 4 MG/0.1ML Liqd nasal spray kit Commonly known as: NARCAN Place 0.4 mg into the nose once.   Oxycodone  HCl 10 MG Tabs Take 10 mg by mouth 4 (four) times daily.   prochlorperazine  5 MG tablet Commonly known as: COMPAZINE  Take 2 tablets (10 mg total) by mouth every 6 (six) hours as needed for nausea or vomiting.   propranolol  20 MG tablet Commonly known as: INDERAL  Take 1 tablet (20 mg total) by mouth 2 (two) times daily.   rosuvastatin 5 MG tablet Commonly known as: CRESTOR Take 5 mg by mouth every Monday, Wednesday, and Friday.   trimethoprim 100 MG tablet Commonly known as: TRIMPEX Take 100 mg by mouth daily.   Vitamin D  50 MCG (2000 UT) Caps Take 2,000 Units by mouth daily.               Discharge Care Instructions  (From admission, onward)           Start     Ordered   09/08/23 0000  Weight bearing as tolerated        09/08/23 0741   09/08/23 0000  Change dressing       Comments: You may remove the bulky bandage (ACE wrap and gauze) two days after surgery. You will have an adhesive waterproof bandage underneath. Leave this in place until your first follow-up appointment.   09/08/23 0741            Diagnostic Studies: No results found.  Disposition: Discharge disposition: 01-Home or Self Care       Discharge Instructions     Call MD / Call 911   Complete by: As directed    If you experience chest pain or shortness of breath, CALL 911 and be transported to the hospital emergency room.  If you develope a fever above 101 F, pus (white drainage) or increased drainage or redness at the wound, or calf pain, call your surgeon's office.   Change dressing   Complete by: As directed    You may remove the bulky bandage (ACE wrap and gauze) two days  after surgery. You will have an adhesive waterproof bandage underneath. Leave this in place until your first follow-up appointment.   Constipation Prevention   Complete by: As directed    Drink plenty of fluids.  Prune juice may be helpful.  You may use a stool softener, such as Colace (over the counter) 100 mg twice a day.  Use MiraLax (over the counter) for constipation as needed.  Diet - low sodium heart healthy   Complete by: As directed    Do not put a pillow under the knee. Place it under the heel.   Complete by: As directed    Driving restrictions   Complete by: As directed    No driving for two weeks   Post-operative opioid taper instructions:   Complete by: As directed    POST-OPERATIVE OPIOID TAPER INSTRUCTIONS: It is important to wean off of your opioid medication as soon as possible. If you do not need pain medication after your surgery it is ok to stop day one. Opioids include: Codeine, Hydrocodone (Norco, Vicodin), Oxycodone (Percocet, oxycontin ) and hydromorphone amongst others.  Long term and even short term use of opiods can cause: Increased pain response Dependence Constipation Depression Respiratory depression And more.  Withdrawal symptoms can include Flu like symptoms Nausea, vomiting And more Techniques to manage these symptoms Hydrate well Eat regular healthy meals Stay active Use relaxation techniques(deep breathing, meditating, yoga) Do Not substitute Alcohol to help with tapering If you have been on opioids for less than two weeks and do not have pain than it is ok to stop all together.  Plan to wean off of opioids This plan should start within one week post op of your joint replacement. Maintain the same interval or time between taking each dose and first decrease the dose.  Cut the total daily intake of opioids by one tablet each day Next start to increase the time between doses. The last dose that should be eliminated is the evening dose.       TED hose   Complete by: As directed    Use stockings (TED hose) for three weeks on both leg(s).  You may remove them at night for sleeping.   Weight bearing as tolerated   Complete by: As directed         Follow-up Information     Liliane Rei, MD. Go on 09/22/2023.   Specialty: Orthopedic Surgery Why: You have a post op appointment scheduled for Tuesday 09/22/23 at 2:00pm Contact information: [address] [place] [address] [phone]         Alan All.. Go on 09/10/2023.   Why: You are scheduled for a physical therapy appointment Thursday 09/10/23 at 10:00am; [address] Contact information: [address] [place] [address] [phone]                  Signed: Sharlynn Dear 09/09/2023, 8:16 AM

## 2023-09-10 DIAGNOSIS — M25561 Pain in right knee: Secondary | ICD-10-CM | POA: Diagnosis not present

## 2023-09-14 DIAGNOSIS — M25561 Pain in right knee: Secondary | ICD-10-CM | POA: Diagnosis not present

## 2023-09-21 DIAGNOSIS — M25561 Pain in right knee: Secondary | ICD-10-CM | POA: Diagnosis not present

## 2023-09-22 DIAGNOSIS — Z5189 Encounter for other specified aftercare: Secondary | ICD-10-CM | POA: Insufficient documentation

## 2023-09-24 DIAGNOSIS — M25561 Pain in right knee: Secondary | ICD-10-CM | POA: Diagnosis not present

## 2023-09-25 DIAGNOSIS — C4491 Basal cell carcinoma of skin, unspecified: Secondary | ICD-10-CM | POA: Diagnosis not present

## 2023-09-25 DIAGNOSIS — M1711 Unilateral primary osteoarthritis, right knee: Secondary | ICD-10-CM | POA: Diagnosis not present

## 2023-09-25 DIAGNOSIS — Z4889 Encounter for other specified surgical aftercare: Secondary | ICD-10-CM | POA: Diagnosis not present

## 2023-09-29 DIAGNOSIS — M25561 Pain in right knee: Secondary | ICD-10-CM | POA: Diagnosis not present

## 2023-10-02 DIAGNOSIS — M25561 Pain in right knee: Secondary | ICD-10-CM | POA: Diagnosis not present

## 2023-10-13 DIAGNOSIS — Z5189 Encounter for other specified aftercare: Secondary | ICD-10-CM | POA: Diagnosis not present

## 2023-11-02 DIAGNOSIS — R339 Retention of urine, unspecified: Secondary | ICD-10-CM | POA: Diagnosis not present

## 2023-11-02 DIAGNOSIS — R3912 Poor urinary stream: Secondary | ICD-10-CM | POA: Diagnosis not present

## 2023-11-03 DIAGNOSIS — R251 Tremor, unspecified: Secondary | ICD-10-CM | POA: Diagnosis not present

## 2023-11-11 DIAGNOSIS — E875 Hyperkalemia: Secondary | ICD-10-CM | POA: Diagnosis not present

## 2023-11-11 DIAGNOSIS — M25571 Pain in right ankle and joints of right foot: Secondary | ICD-10-CM | POA: Diagnosis not present

## 2023-11-12 DIAGNOSIS — M25571 Pain in right ankle and joints of right foot: Secondary | ICD-10-CM | POA: Diagnosis not present

## 2023-11-12 DIAGNOSIS — M79671 Pain in right foot: Secondary | ICD-10-CM | POA: Diagnosis not present

## 2023-11-12 DIAGNOSIS — M7731 Calcaneal spur, right foot: Secondary | ICD-10-CM | POA: Diagnosis not present

## 2023-11-12 DIAGNOSIS — M19071 Primary osteoarthritis, right ankle and foot: Secondary | ICD-10-CM | POA: Diagnosis not present

## 2023-11-30 ENCOUNTER — Ambulatory Visit: Admitting: Neurology

## 2023-11-30 ENCOUNTER — Encounter: Payer: Self-pay | Admitting: Neurology

## 2023-11-30 VITALS — BP 125/68 | HR 60 | Ht 69.0 in | Wt 213.0 lb

## 2023-11-30 DIAGNOSIS — Z8673 Personal history of transient ischemic attack (TIA), and cerebral infarction without residual deficits: Secondary | ICD-10-CM

## 2023-11-30 DIAGNOSIS — G6289 Other specified polyneuropathies: Secondary | ICD-10-CM

## 2023-11-30 DIAGNOSIS — G250 Essential tremor: Secondary | ICD-10-CM

## 2023-11-30 DIAGNOSIS — E1140 Type 2 diabetes mellitus with diabetic neuropathy, unspecified: Secondary | ICD-10-CM | POA: Insufficient documentation

## 2023-11-30 DIAGNOSIS — C4491 Basal cell carcinoma of skin, unspecified: Secondary | ICD-10-CM | POA: Insufficient documentation

## 2023-11-30 MED ORDER — PROPRANOLOL HCL 20 MG PO TABS: 20.0000 mg | ORAL_TABLET | Freq: Two times a day (BID) | ORAL | 4 refills | Status: AC

## 2023-11-30 MED ORDER — CLOPIDOGREL BISULFATE 75 MG PO TABS: 75.0000 mg | ORAL_TABLET | Freq: Every day | ORAL | 3 refills | Status: AC

## 2023-11-30 MED ORDER — DULOXETINE HCL 60 MG PO CPEP: 60.0000 mg | ORAL_CAPSULE | Freq: Every day | ORAL | 4 refills | Status: AC

## 2023-11-30 MED ORDER — GABAPENTIN 800 MG PO TABS: 800.0000 mg | ORAL_TABLET | Freq: Three times a day (TID) | ORAL | 4 refills | Status: AC

## 2023-11-30 NOTE — Progress Notes (Signed)
 ASSESSMENT AND PLAN  David Li is a 83 y.o. male   1.  Progressive disability with neuropathy, progressive worsening neuropathic pain  EMG nerve conduction study significant worsening compared to previous study in 2017, definite axonal loss, could not rule out demyelinating component, especially with his elevated CSF total protein of 63 in the past,  Does not wish to pursue IVIG treatment  Continue chronic medications for neuropathy pain, Cymbalta  60 mg daily, gabapentin  800 mg 3 times a day, receives oxycodone  from pain management  Advised to exercise, be active  2.  Bilateral hand tremor -Continue propranolol  twice a day  3.  History of stroke - We have continue to refill Plavix  75 mg daily for secondary stroke prevention  Follow-up in 1 year or sooner if needed  DIAGNOSTIC DATA (LABS, IMAGING, TESTING) - I reviewed patient records, labs, notes, testing and imaging myself where available. Laboratory evaluations from May 05, 2022, normal CBC hemoglobin 14.7, normal CMP creatinine of 0.59, A1c 6.6, TSH was elevated 6.57, LDL was 125  MEDICAL HISTORY: [name] was followed by Dr. Germaine since April 2006, he presented with sensory changes starting at the sole of his feet, gradually ascending to bilateral lower extremity, fairly symmetric.  EMG nerve conduction study and spine imaging were negative initially,  In August 2006 he was hospitalized with fever and neuralgia, temporal artery biopsy demonstrated vasculitis, he was treated with methotrexate and prednisone with good response.  He is no longer on immunosuppressive treatment  His neuropathic symptoms continue to progress, require higher dose of gabapentin , polypharmacy of nortriptyline ,  Over the years, his lower extremity paresthesia, numbness tingling neuropathic pain continue to progress, he enjoys work with rescue dogs, used to be very physically active, is a retired Engineering geologist, now has difficulty with  his balance  EMG nerve conduction study in January 2017 showed mild length-dependent peripheral neuropathy, no significant demyelinating features  Around 2017, he also noticed bilateral fingertips numbness tightness sensation, spinal fluid at that time showed total protein 63 with normal glucose and cells,  Over the years he is on titrating dose of medication for his lower extremity neuropathic pain, began to see pain management Dr. Georgeana since 2021, was taking oxycodone , was changed to Dilaudid  since 2022,  He also developed gradual onset slow worsening bilateral hands tremor, began to receive low dose beta-blocker, seems to help his symptoms  He is now on polypharmacy treatment, Cymbalta  60 mg daily, gabapentin  800 mg 3 times a day, Dilaudid  4 mg 4 times a day, also take Ambien  low-dose for difficulty sleeping at nighttime  Extensive laboratory evaluation around 2016 showed normal negative RPR, C-reactive protein, thyroid  functional test, B12, ANA, protein electrophoresis, Lyme titer, hepatitis, CBC, CMP, A1c was 6.1,  Spinal fluid testing November 2016, WBC 1, total protein of 63  UPDATE November 27 2022: He works at [employer] twice a week, hands are more numb, burning, can still manage his tools, neuropathic pain medicine works well for him,Cymbalta  60 mg daily, gabapentin  800 mg 3 times a day, oxycodone  5mg  4 times a day, again discussed IVIG treatment, he does not want to proceed  Update 11/30/23 SS: neuropathy worsening, oxycodone  helps, constant 3/10 burning in his feet, ankles, knees. Right knee replacement 2 months ago, helped with gait. Uses cane if needed. Walking on hot pavement. On gabapentin  800 mg TID, Cymbalta  60 mg daily. Plavix  75 mg daily, secondary stroke prevention. Tremor in hands, propranolol  helps, no worsening. Doesn't want to do IVIG,  has had for 20 years. Has some stiffness, mild burning in his hands now.   Physical Exam  General: The patient is alert and  cooperative at the time of the examination.  Skin: No significant peripheral edema is noted.   Neurologic Exam  Mental status: The patient is alert and oriented x 3 at the time of the examination. The patient has apparent normal recent and remote memory, with an apparently normal attention span and concentration ability.   Cranial nerves: Facial symmetry is present. Speech is normal, no aphasia or dysarthria is noted. Extraocular movements are full. Visual fields are full.  Motor: The patient has good strength in all 4 extremities.  Bilateral mild to moderate hand tremor.  Sensory examination: Decreased soft touch sensation length-dependent above knee level  Coordination: The patient has good finger-nose-finger and heel-to-shin bilaterally.  Gait and station: Can stand with arms crossed from seated position, gait is cautious, wide-based  Reflexes: Deep tendon reflexes are decreased to absent  REVIEW OF SYSTEMS:  Full 14 system review of systems performed and notable only for as above All other review of systems were negative.   ALLERGIES: Allergies  Allergen Reactions   Amoxicillin  Nausea And Vomiting   Macrobid  [Nitrofurantoin Macrocrystal] Nausea And Vomiting    Severe N&V   Other     Muscle relaxant medication-unsure of the name    HOME MEDICATIONS: Current Outpatient Medications  Medication Sig Dispense Refill   acyclovir (ZOVIRAX) 400 MG tablet Take 400 mg by mouth daily as needed (cold sores). Outbreaks      ALPRAZolam  (XANAX ) 0.25 MG tablet Take 0.25 mg by mouth daily as needed for anxiety.     Azelastine HCl 137 MCG/SPRAY SOLN Place 2 sprays into both nostrils daily as needed (allergies).     b complex vitamins capsule Take 1 capsule by mouth daily.     Cholecalciferol  (VITAMIN D ) 2000 units CAPS Take 2,000 Units by mouth daily.     clopidogrel  (PLAVIX ) 75 MG tablet TAKE 1 TABLET BY MOUTH EVERY DAY 90 tablet 3   cyclobenzaprine  (FLEXERIL ) 10 MG tablet Take 1  tablet (10 mg total) by mouth 3 (three) times daily as needed for muscle spasms. 30 tablet 0   DULoxetine  (CYMBALTA ) 60 MG capsule TAKE 1 CAPSULE BY MOUTH EVERY DAY 90 capsule 4   gabapentin  (NEURONTIN ) 800 MG tablet Take 1 tablet (800 mg total) by mouth 3 (three) times daily. 270 tablet 4   HYDROmorphone  (DILAUDID ) 2 MG tablet Take 1-2 tablets by mouth every 6 hours as needed for severe pain (pain score 7-10) (severe breakthrough pain not responding to chronic oxycodone ). 42 tablet 0   JARDIANCE  10 MG TABS tablet Take 10 mg by mouth daily.     naloxone (NARCAN) nasal spray 4 mg/0.1 mL Place 0.4 mg into the nose once.     Oxycodone  HCl 10 MG TABS Take 10 mg by mouth 4 (four) times daily.     prochlorperazine  (COMPAZINE ) 5 MG tablet Take 2 tablets (10 mg total) by mouth every 6 (six) hours as needed for nausea or vomiting. 15 tablet 1   propranolol  (INDERAL ) 20 MG tablet Take 1 tablet (20 mg total) by mouth 2 (two) times daily. 180 tablet 4   rosuvastatin  (CRESTOR ) 5 MG tablet Take 5 mg by mouth every Monday, Wednesday, and Friday.     trimethoprim  (TRIMPEX ) 100 MG tablet Take 100 mg by mouth daily.  11   No current facility-administered medications for this visit.  PAST MEDICAL HISTORY: Past Medical History:  Diagnosis Date   Anemia    Anxiety    Arthritis    BPH (benign prostatic hyperplasia)    self caths 1-2 times daily.   Diabetes mellitus without complication (HCC)    GERD (gastroesophageal reflux disease)    Hypertension    Neuromuscular disorder (HCC)    idiopathic,both feet,progressing to legs   Perforated ulcer (HCC) 1982   Stroke [facility])    states the Plavix  is preventative due to the strong family history of stroke   Superficial basal cell carcinoma (BCC) 03/25/2021   Right Anterior Neck    PAST SURGICAL HISTORY: Past Surgical History:  Procedure Laterality Date   ANKLE SURGERY Left    repair tendon   CARPAL TUNNEL RELEASE Right    CHOLECYSTECTOMY N/A 04/07/2016    Procedure: LAPAROSCOPIC CHOLECYSTECTOMY;  Surgeon: Herlene Beverley Bureau, MD;  Location: [facility];  Service: General;  Laterality: N/A;   ESOPHAGOGASTRODUODENOSCOPY N/A 03/05/2016   Procedure: ESOPHAGOGASTRODUODENOSCOPY (EGD);  Surgeon: Lynwood Bohr, MD;  Location: [facility];  Service: Endoscopy;  Laterality: N/A;   HERNIA REPAIR  2012   KNEE ARTHROSCOPY W/ DEBRIDEMENT Right 2013   TONSILLECTOMY     TOTAL KNEE ARTHROPLASTY Right 09/07/2023   Procedure: RIGHT TOTAL KNEE ARTHROPLASTY;  Surgeon: Melodi Lerner, MD;  Location: [facility];  Service: Orthopedics;  Laterality: Right;   TRANSURETHRAL RESECTION OF PROSTATE  2006    FAMILY HISTORY: Family History  Problem Relation Age of Onset   Stroke Mother    Diabetes Mother    Stroke Father    Diabetes Father    Diabetes Sister    Diabetes Brother     SOCIAL HISTORY: Social History   Socioeconomic History   Marital status: Married    Spouse name: Elvie   Number of children: 2   Years of education: college   Highest education level: Not on file  Occupational History    Comment: retired  Tobacco Use   Smoking status: Former   Smokeless tobacco: Never  Vaping Use   Vaping status: Never Used  Substance and Sexual Activity   Alcohol use: Yes    Alcohol/week: 1.0 standard drink of alcohol    Types: 1 Glasses of wine per week    Comment: daily   Drug use: No   Sexual activity: Not Currently  Other Topics Concern   Not on file  Social History Narrative   Patient is retired, Married for 26 years [place]).   EducationDesigner, jewellery.   Right handed.   Caffeine- five cups daily only tea         Social Drivers of Corporate investment banker Strain: Not on file  Food Insecurity: No Food Insecurity (09/07/2023)   Hunger Vital Sign    Worried About Running Out of Food in the Last Year: Never true    Ran Out of Food in the Last Year: Never true  Transportation Needs: No Transportation Needs (09/07/2023)   PRAPARE -  Administrator, Civil Service (Medical): No    Lack of Transportation (Non-Medical): No  Physical Activity: Not on file  Stress: Not on file  Social Connections: Socially Integrated (09/07/2023)   Social Connection and Isolation Panel    Frequency of Communication with Friends and Family: More than three times a week    Frequency of Social Gatherings with Friends and Family: More than three times a week    Attends Religious Services: More than 4 times  per year    Active Member of Clubs or Organizations: Yes    Attends Banker Meetings: More than 4 times per year    Marital Status: Married  Catering manager Violence: Not At Risk (09/07/2023)   Humiliation, Afraid, Rape, and Kick questionnaire    Fear of Current or Ex-Partner: No    Emotionally Abused: No    Physically Abused: No    Sexually Abused: No    Lauraine Gayland MANDES, DNP  [facility] [address], [address] [place], [place] [address] [phone]

## 2023-11-30 NOTE — Patient Instructions (Signed)
 Great to see you today! Continue current medications  Continue to be active and exercise Follow up in 1 year  Thanks!!

## 2023-12-03 ENCOUNTER — Ambulatory Visit: Payer: Medicare PPO | Admitting: Neurology

## 2024-06-15 ENCOUNTER — Telehealth: Payer: Self-pay | Admitting: *Deleted

## 2024-06-15 NOTE — Telephone Encounter (Signed)
 Received clearance form for pt from [facility]. Date of procedure circumcision 06-22-2024.

## 2024-06-15 NOTE — Telephone Encounter (Signed)
 I tried to contact the patient to discuss, I left a message asking for him to contact us  back. He should be fine to be cleared to stop Plavix  5 days before circumcision scheduled 06/22/24. I looked back at notes, dizzy spell back in 2008, suspected TIA, treated with Plavix  since then.

## 2024-06-16 ENCOUNTER — Telehealth: Payer: Self-pay | Admitting: Neurology

## 2024-06-16 NOTE — Telephone Encounter (Signed)
 I spoke with patient, will give clearance to stop Plavix  5 days before procedure. See prior telephone note. Small but acceptable risk of stopping Plavix  for procedure.

## 2024-06-16 NOTE — Telephone Encounter (Signed)
Faxed to [facility]  

## 2024-11-28 ENCOUNTER — Ambulatory Visit: Admitting: Neurology

## 2024-11-30 ENCOUNTER — Ambulatory Visit: Admitting: Neurology
